# Patient Record
Sex: Male | Born: 1950
Health system: Southern US, Community
[De-identification: ages and names within clinical notes are randomized; demographics above are authoritative.]

## PROBLEM LIST (undated history)

## (undated) DIAGNOSIS — I671 Cerebral aneurysm, nonruptured: Secondary | ICD-10-CM

## (undated) DIAGNOSIS — C439 Malignant melanoma of skin, unspecified: Secondary | ICD-10-CM

## (undated) DIAGNOSIS — M1990 Unspecified osteoarthritis, unspecified site: Secondary | ICD-10-CM

## (undated) DIAGNOSIS — I639 Cerebral infarction, unspecified: Secondary | ICD-10-CM

## (undated) DIAGNOSIS — B019 Varicella without complication: Secondary | ICD-10-CM

## (undated) DIAGNOSIS — K219 Gastro-esophageal reflux disease without esophagitis: Secondary | ICD-10-CM

## (undated) HISTORY — DX: Varicella without complication: B01.9

## (undated) HISTORY — DX: Unspecified osteoarthritis, unspecified site: M19.90

## (undated) HISTORY — DX: Malignant melanoma of skin, unspecified: C43.9

## (undated) HISTORY — PX: TONSILLECTOMY AND ADENOIDECTOMY: SUR1326

## (undated) HISTORY — DX: Cerebral infarction, unspecified: I63.9

## (undated) HISTORY — DX: Cerebral aneurysm, nonruptured: I67.1

## (undated) HISTORY — DX: Gastro-esophageal reflux disease without esophagitis: K21.9

---

## 1989-01-29 HISTORY — PX: BRAIN SURGERY: SHX531

## 2013-11-19 ENCOUNTER — Encounter: Payer: Self-pay | Admitting: Family

## 2013-11-19 ENCOUNTER — Ambulatory Visit (INDEPENDENT_AMBULATORY_CARE_PROVIDER_SITE_OTHER): Payer: BC Managed Care – PPO | Admitting: Family

## 2013-11-19 VITALS — BP 138/88 | HR 65 | Temp 98.1°F | Resp 18 | Ht 72.0 in | Wt 203.4 lb

## 2013-11-19 DIAGNOSIS — N529 Male erectile dysfunction, unspecified: Secondary | ICD-10-CM | POA: Diagnosis not present

## 2013-11-19 DIAGNOSIS — Z125 Encounter for screening for malignant neoplasm of prostate: Secondary | ICD-10-CM | POA: Diagnosis not present

## 2013-11-19 DIAGNOSIS — K219 Gastro-esophageal reflux disease without esophagitis: Secondary | ICD-10-CM | POA: Diagnosis not present

## 2013-11-19 DIAGNOSIS — M79643 Pain in unspecified hand: Secondary | ICD-10-CM | POA: Diagnosis not present

## 2013-11-19 MED ORDER — SILDENAFIL CITRATE 25 MG PO TABS: 25.0000 mg | ORAL_TABLET | Freq: Every day | ORAL | Status: DC | PRN

## 2013-11-19 NOTE — Progress Notes (Signed)
   Subjective:    Patient ID: James Myers, male    DOB: December 30, 1950, 63 y.o.   MRN: [id number]  Chief Complaint  Patient presents with  . Establish Care    would like basic blood work    HPI:  James Myers is a 63 y.o. male who presents today to establish care. Been 2 years since any check up. Previously followed by [facility]. Followed by [facility] - Dr. Jarome Matin; Prefers not to be on many medications.   1) GERD - Started about 10 years ago; Has a history of hiatal hernia. Maintained without medication; if there is any increase in heartburn may take a TUMS every now and then.  2) Arthritis - Noted fingers to to be painful. Questions whether it be rheumatoid arthritis.   3) ED - been a problem for about 10 years. Unsure if testosterone levels were ever tested. Previously taking Viagra which he indicates was beneficial.   No Known Allergies  Current outpatient prescriptions:aspirin EC 81 MG tablet, Take 81 mg by mouth daily., Disp: , Rfl: ;  Glucosamine-Chondroitin (OSTEO BI-FLEX REGULAR STRENGTH PO), Take by mouth daily., Disp: , Rfl: ;  sildenafil (VIAGRA) 25 MG tablet, Take 1 tablet (25 mg total) by mouth daily as needed for erectile dysfunction., Disp: 4 tablet, Rfl: 0  Past Medical History  Diagnosis Date  . GERD (gastroesophageal reflux disease)   . Arthritis   . Stroke   . Brain aneurysm    Family History  Problem Relation Age of Onset  . Stroke Father   . Arthritis Father    History   Social History  . Marital Status: Married    Spouse Name: N/A    Number of Children: 1  . Years of Education: 16   Occupational History  . Retired      Geophysicist/field seismologist for [employer]  . Smoking status: Never Smoker   . Smokeless tobacco: Never Used  . Alcohol Use: Yes     Comment: 4 oz daily  . Drug Use: No  . Sexual Activity: Yes   Other Topics Concern  . Not on file   Social History Narrative   Born and raised in [place].  Currently resided in a private residence with his wife. Denies pets.    Fun: Golf and likes to swim / yard-work   Denies religious beliefs effecting healthcare.     Review of Systems    See HPI  Objective:    BP 138/88  Pulse 65  Temp(Src) 98.1 F (36.7 C) (Oral)  Resp 18  Ht 6' (1.829 m)  Wt 203 lb 6.4 oz (92.262 kg)  BMI 27.58 kg/m2  SpO2 97% Nursing note and vital signs reviewed.  Physical Exam  Constitutional: He is oriented to person, place, and time. He appears well-developed and well-nourished. No distress.  Cardiovascular: Normal rate, regular rhythm and normal heart sounds.   Pulmonary/Chest: Effort normal and breath sounds normal.  Musculoskeletal:  Grip strength appropriate bilaterally with tenderness of DIP and PIP joints of bilateral hands.   Neurological: He is alert and oriented to person, place, and time.  Skin: Skin is warm and dry.  Psychiatric: He has a normal mood and affect. His behavior is normal. Judgment and thought content normal.        Assessment & Plan:

## 2013-11-19 NOTE — Progress Notes (Signed)
Pre visit review using our clinic review tool, if applicable. No additional management support is needed unless otherwise documented below in the visit note. 

## 2013-11-19 NOTE — Assessment & Plan Note (Signed)
Most likely related to osteoarthritis. Currently taking Osteo Bi-Flex which helps. Discussed possibility of adding prescription medication if symptoms worsen. Limit NSAID with previous hx of hiatal hernia.

## 2013-11-19 NOTE — Assessment & Plan Note (Signed)
Previous history of ED and has taken Viagra. Obtain Testosterone level. Start Viagra as needed.

## 2013-11-19 NOTE — Patient Instructions (Addendum)
Thank you for choosing [facility].  Summary/Instructions:   Please schedule a time for a physical  Please stop by the lab to get your lab work at your convenience - please make sure to come in the morning for accurate testosterone levels.  Thank you for enrolling in [place]. Please follow the instructions below to securely access your online medical record. MyChart allows you to send messages to your doctor, view your test results, renew your prescriptions, schedule appointments, and more.  How Do I Sign Up? 1. In your [employer], go to [email or web address]. 2. Click on the New  User? link in the Sign In box.  3. Enter your MyChart Access Code exactly as it appears below. You will not need to use this code after you have completed the sign-up process. If you do not sign up before the expiration date, you must request a new code. MyChart Access Code: [email or web address] Expires: 01/18/2014  3:16 PM  4. Enter the last four digits of your Social Security Number (xxxx) and Date of Birth (mm/dd/yyyy) as indicated and click Next. You will be taken to the next sign-up page. 5. Create a MyChart ID. This will be your MyChart login ID and cannot be changed, so think of one that is secure and easy to remember. 6. Create a MyChart password. You can change your password at any time. 7. Enter your Password Reset Question and Answer and click Next. This can be used at a later time if you forget your password.  8. Select your communication preference, and if applicable enter your e-mail address. You will receive e-mail notification when new information is available in MyChart by choosing to receive e-mail notifications and filling in your e-mail. 9. Click Sign In. You can now view your medical record.   Additional Information If you have questions, you can email [email or web address] or call [phone] to talk to our [facility] staff. Remember, MyChart is NOT to be used for urgent  needs. For medical emergencies, dial 911.

## 2013-11-19 NOTE — Assessment & Plan Note (Signed)
Previous history of hiatal hernia. Currently stable on no prescription medication. Continue OTC Tums as needed. Will consider prescription medication if symptoms worsen.

## 2013-11-23 ENCOUNTER — Other Ambulatory Visit: Payer: Self-pay

## 2013-11-23 MED ORDER — SILDENAFIL CITRATE 20 MG PO TABS: 20.0000 mg | ORAL_TABLET | Freq: Every day | ORAL | Status: DC | PRN

## 2013-11-23 MED ORDER — SILDENAFIL CITRATE 25 MG PO TABS
25.0000 mg | ORAL_TABLET | Freq: Every day | ORAL | Status: DC | PRN
Start: 2013-11-23 — End: 2013-11-23

## 2013-11-25 ENCOUNTER — Telehealth: Payer: Self-pay | Admitting: Family

## 2013-11-25 ENCOUNTER — Other Ambulatory Visit (INDEPENDENT_AMBULATORY_CARE_PROVIDER_SITE_OTHER): Payer: BC Managed Care – PPO

## 2013-11-25 DIAGNOSIS — Z125 Encounter for screening for malignant neoplasm of prostate: Secondary | ICD-10-CM

## 2013-11-25 DIAGNOSIS — N529 Male erectile dysfunction, unspecified: Secondary | ICD-10-CM

## 2013-11-25 DIAGNOSIS — K219 Gastro-esophageal reflux disease without esophagitis: Secondary | ICD-10-CM

## 2013-11-25 LAB — LIPID PANEL
CHOLESTEROL: 184 mg/dL (ref 0–200)
HDL: 55 mg/dL (ref >39.00)
LDL Cholesterol: 120 mg/dL — ABNORMAL HIGH (ref 0–99)
NONHDL: 129
Total CHOL/HDL Ratio: 3
Triglycerides: 45 mg/dL (ref 0.0–149.0)
VLDL: 9 mg/dL (ref 0.0–40.0)

## 2013-11-25 LAB — BASIC METABOLIC PANEL
BUN: 21 mg/dL (ref 6–23)
CO2: 23 mEq/L (ref 19–32)
Calcium: 9.1 mg/dL (ref 8.4–10.5)
Chloride: 104 mEq/L (ref 96–112)
Creatinine, Ser: 1.2 mg/dL (ref 0.4–1.5)
GFR: 68.12 mL/min (ref >60.00)
Glucose, Bld: 101 mg/dL — ABNORMAL HIGH (ref 70–99)
POTASSIUM: 4.3 meq/L (ref 3.5–5.1)
SODIUM: 139 meq/L (ref 135–145)

## 2013-11-25 LAB — CBC
HEMATOCRIT: 42.7 % (ref 39.0–52.0)
HEMOGLOBIN: 13.5 g/dL (ref 13.0–17.0)
MCHC: 31.7 g/dL (ref 30.0–36.0)
MCV: 83.8 fl (ref 78.0–100.0)
Platelets: 309 10*3/uL (ref 150.0–400.0)
RBC: 5.09 Mil/uL (ref 4.22–5.81)
RDW: 18.2 % — ABNORMAL HIGH (ref 11.5–15.5)
WBC: 5.1 10*3/uL (ref 4.0–10.5)

## 2013-11-25 LAB — PSA: PSA: 0.33 ng/mL (ref 0.10–4.00)

## 2013-11-25 LAB — TESTOSTERONE: Testosterone: 402.82 ng/dL (ref 300.00–890.00)

## 2013-11-25 NOTE — Telephone Encounter (Signed)
Please call the patient to let him know that his prostate blood test was normal. His testosterone was also in the normal range. There was one

## 2013-11-25 NOTE — Telephone Encounter (Signed)
Please call the patient and let him know that both his testosterone levels and prostate levels were within the normal range. There was one lab that I would like to gather more information. His red blood cell size are slightly smaller than the average and when he comes in for the next appointment I'd like to order another lab to test for iron deficiency. This is nothing to be alarmed about, just trying to catch things to ensure we do not have a problems later.

## 2013-11-26 NOTE — Telephone Encounter (Signed)
Called pt and no answer. Left message

## 2013-11-26 NOTE — Telephone Encounter (Signed)
Pt called back. Let him know his testosterone and prostate levels were within normal limits but his his rbcs were slightly smaller than average. Let him know that [facility] would like to do more lab work to test for iron deficiency. He understood and wasn't surprise because he donates blood regularly. He asked that his lab results be sent to my chart. Will you please send them?

## 2013-11-26 NOTE — Telephone Encounter (Signed)
Results released

## 2013-12-01 ENCOUNTER — Encounter: Payer: Self-pay | Admitting: Family

## 2013-12-01 ENCOUNTER — Ambulatory Visit (INDEPENDENT_AMBULATORY_CARE_PROVIDER_SITE_OTHER): Payer: BC Managed Care – PPO | Admitting: Family

## 2013-12-01 ENCOUNTER — Other Ambulatory Visit (INDEPENDENT_AMBULATORY_CARE_PROVIDER_SITE_OTHER): Payer: BC Managed Care – PPO

## 2013-12-01 ENCOUNTER — Other Ambulatory Visit: Payer: Self-pay | Admitting: Family

## 2013-12-01 VITALS — BP 130/88 | HR 55 | Temp 97.9°F | Resp 18 | Ht 72.0 in | Wt 203.1 lb

## 2013-12-01 DIAGNOSIS — Z23 Encounter for immunization: Secondary | ICD-10-CM | POA: Diagnosis not present

## 2013-12-01 DIAGNOSIS — R718 Other abnormality of red blood cells: Secondary | ICD-10-CM

## 2013-12-01 DIAGNOSIS — Z0000 Encounter for general adult medical examination without abnormal findings: Secondary | ICD-10-CM

## 2013-12-01 LAB — CBC
HCT: 41.9 % (ref 39.0–52.0)
Hemoglobin: 13.5 g/dL (ref 13.0–17.0)
MCHC: 32.2 g/dL (ref 30.0–36.0)
MCV: 82.9 fl (ref 78.0–100.0)
PLATELETS: 278 10*3/uL (ref 150.0–400.0)
RBC: 5.05 Mil/uL (ref 4.22–5.81)
RDW: 17.8 % — ABNORMAL HIGH (ref 11.5–15.5)
WBC: 4.7 10*3/uL (ref 4.0–10.5)

## 2013-12-01 LAB — IRON AND TIBC
%SAT: 6 % — ABNORMAL LOW (ref 20–55)
IRON: 27 ug/dL — AB (ref 42–165)
TIBC: 463 ug/dL — AB (ref 215–435)
UIBC: 436 ug/dL — AB (ref 125–400)

## 2013-12-01 LAB — TSH: TSH: 2.59 u[IU]/mL (ref 0.35–4.50)

## 2013-12-01 LAB — FERRITIN: FERRITIN: 5.6 ng/mL — AB (ref 22.0–322.0)

## 2013-12-01 MED ORDER — FERROUS SULFATE 325 (65 FE) MG PO TABS: 325.0000 mg | ORAL_TABLET | Freq: Every day | ORAL | Status: DC

## 2013-12-01 NOTE — Progress Notes (Signed)
Subjective:    Patient ID: James Myers, male    DOB: Mar 20, 1950, 63 y.o.   MRN: [id number]  Chief Complaint  Patient presents with  . CPE   HPI:  James Myers is a 63 y.o. male who presents today for an annual wellness visit.  1) Health Maintenance -   Diet - Thankful for wife because she helps to monitor his food and makes sure he has a balanced meals of fruits, vegetables and protein.  Exercise - Member of [employer] [facility] [place] - plays golf, swims and exercise.   Wt Readings from Last 3 Encounters:  12/01/13 203 lb 1.9 oz (92.135 kg)  11/19/13 203 lb 6.4 oz (92.262 kg)   2) Preventative Exams / Immunizations:  Dental - Up to date  Vision -- Up to date  Immunizations --Had flu vaccine; Unsure of PPSV23; Zostovax is completed.  Colonoscopy -- Up date - recommends 2017 follow up.   Review of Systems  Constitutional: Denies fever, chills, fatigue, or significant weight gain/loss. HENT: Head: Denies headache or neck pain Ears: Denies changes in hearing, ringing in ears, earache, drainage Nose: Denies discharge, stuffiness, itching, nosebleed, sinus pain Throat: Denies sore throat, hoarseness, dry mouth, sores, thrush Eyes: Denies loss/changes in vision, pain, redness, blurry/double vision, flashing lights Cardiovascular: Denies chest pain/discomfort, tightness, palpitations, shortness of breath with activity, difficulty lying down, swelling, sudden awakening with shortness of breath Respiratory: Denies shortness of breath, cough, sputum production, wheezing Gastrointestinal: Denies dysphasia, heartburn, change in appetite, nausea, change in bowel habits, rectal bleeding, constipation, diarrhea, yellow skin or eyes Genitourinary: Denies frequency, urgency, burning/pain, blood in urine, incontinence, change in urinary strength. Musculoskeletal: Denies muscle/joint pain, stiffness, back pain, redness or swelling of joints, trauma Skin: Denies rashes, lumps, itching, dryness,  color changes, or hair/nail changes Neurological: Denies dizziness, fainting, seizures, weakness, numbness, tingling, tremor Psychiatric - Denies nervousness, stress, depression or memory loss Endocrine: Denies heat or cold intolerance, sweating, frequent urination, excessive thirst, changes in appetite Hematologic: Denies ease of bruising or bleeding    Objective:    BP 130/88 mmHg  Pulse 55  Temp(Src) 97.9 F (36.6 C) (Oral)  Resp 18  Ht 6' (1.829 m)  Wt 203 lb 1.9 oz (92.135 kg)  BMI 27.54 kg/m2  SpO2 97% Nursing note and vital signs reviewed.  Physical Exam  Constitutional: He is oriented to person, place, and time. He appears well-developed and well-nourished.  HENT:  Head: Normocephalic.  Right Ear: Hearing, tympanic membrane, external ear and ear canal normal.  Left Ear: Hearing, tympanic membrane, external ear and ear canal normal.  Nose: Nose normal.  Mouth/Throat: Uvula is midline, oropharynx is clear and moist and mucous membranes are normal.  Eyes: Conjunctivae and EOM are normal. Pupils are equal, round, and reactive to light.  Neck: Neck supple. No JVD present. No tracheal deviation present. No thyromegaly present.  Cardiovascular: Normal rate, regular rhythm, normal heart sounds and intact distal pulses.   Pulmonary/Chest: Effort normal and breath sounds normal.  Abdominal: Soft. Bowel sounds are normal. He exhibits no distension and no mass. There is no tenderness. There is no rebound and no guarding.  Musculoskeletal: Normal range of motion. He exhibits no edema or tenderness.  Lymphadenopathy:    He has no cervical adenopathy.  Neurological: He is alert and oriented to person, place, and time. He has normal reflexes. No cranial nerve deficit. He exhibits normal muscle tone. Coordination normal.  Skin: Skin is warm and dry.  Psychiatric: He has a  normal mood and affect. His behavior is normal. Judgment and thought content normal.      Assessment & Plan:

## 2013-12-01 NOTE — Assessment & Plan Note (Addendum)
1) Anticipatory Guidance: Discussed importance of wearing a seatbelt while driving and not texting while driving; changing batteries in smoke detector at least once annually; wearing suntan lotion when outside; eating a balanced and moderate diet; getting physical activity at least 30 minutes per day.  2) Immunizations / Screenings / Labs:  Given PPSV23 in office today; all other immunizations are up to date. / Screening and prevention tests are up to date / Obtain CBC, ferritin, TIBC and TSH.  Overall normal preventative exam. Follow up in 1 year or sooner if needed.

## 2013-12-01 NOTE — Progress Notes (Signed)
Pre visit review using our clinic review tool, if applicable. No additional management support is needed unless otherwise documented below in the visit note. 

## 2013-12-01 NOTE — Patient Instructions (Addendum)
Thank you for choosing [facility].  Summary/Instructions:   Please stop by the for your blood work.  We will be in touch regarding your lab work  Plan to follow up in a year or sooner if needed.

## 2014-01-05 ENCOUNTER — Telehealth: Payer: Self-pay | Admitting: Family

## 2014-01-05 NOTE — Telephone Encounter (Signed)
Rec'd from [place] @ [facility] [employer] forward 16 pages to [facility]

## 2014-07-06 ENCOUNTER — Ambulatory Visit (INDEPENDENT_AMBULATORY_CARE_PROVIDER_SITE_OTHER): Payer: BLUE CROSS/BLUE SHIELD | Admitting: Family

## 2014-07-06 ENCOUNTER — Encounter: Payer: Self-pay | Admitting: Family

## 2014-07-06 ENCOUNTER — Ambulatory Visit (INDEPENDENT_AMBULATORY_CARE_PROVIDER_SITE_OTHER)
Admission: RE | Admit: 2014-07-06 | Discharge: 2014-07-06 | Disposition: A | Discharge status: Home / Self Care | Payer: BLUE CROSS/BLUE SHIELD | Source: Ambulatory Visit | Attending: Family | Admitting: Family

## 2014-07-06 VITALS — BP 112/70 | HR 79 | Temp 98.1°F | Resp 18 | Ht 72.0 in | Wt 206.4 lb

## 2014-07-06 DIAGNOSIS — M25511 Pain in right shoulder: Secondary | ICD-10-CM | POA: Insufficient documentation

## 2014-07-06 MED ORDER — NAPROXEN 500 MG PO TABS: 500.0000 mg | ORAL_TABLET | Freq: Two times a day (BID) | ORAL | Status: DC

## 2014-07-06 NOTE — Progress Notes (Signed)
Pre visit review using our clinic review tool, if applicable. No additional management support is needed unless otherwise documented below in the visit note. 

## 2014-07-06 NOTE — Assessment & Plan Note (Signed)
Symptoms and exam consistent with possible shoulder contusion, however cannot rule out rotator cuff pathology or fracture. Obtain x-ray to rule fracture. She conservatively at this time with rest and ice. Recommend short-term sling for support shoulder. Start naproxen 500 mg twice a day. Follow-up in one week or sooner for secondary assessment and possible referral.

## 2014-07-06 NOTE — Progress Notes (Signed)
Subjective:    Patient ID: James Myers, male    DOB: [birth year], 64 y.o.   MRN: [id number]  Chief Complaint  Patient presents with  . Shoulder Pain    Right shoulder pain x2 hours ago. Was playing basketball and landed on shoulder, already has arthritis in that shoulder    HPI:  James Myers is a 64 y.o. male with a PMH of GERD and hand pain who presents today for an acute office visit.  This is a new problem. Associated symptoms of pain located in his right shoulder started approximately 2 hours ago following a fall while playing basketball. Describes the pain as sharp pains with a severity of 9/10 when he tries to lift. Denies any sounds or sensations heard or felt upon landing. Notes that he can passively move his shoulder with minimal pain but has difficulty with active range of motion. Modifying factors include ice and Aleve which have helped a little. Does have arthritis in his right shoulder.   No Known Allergies  Current Outpatient Prescriptions on File Prior to Visit  Medication Sig Dispense Refill  . aspirin EC 81 MG tablet Take 81 mg by mouth daily.    . ferrous sulfate 325 (65 FE) MG tablet Take 1 tablet (325 mg total) by mouth daily with breakfast. 90 tablet 3  . Glucosamine-Chondroitin (OSTEO BI-FLEX REGULAR STRENGTH PO) Take by mouth daily.    . sildenafil (REVATIO) 20 MG tablet Take 1 tablet (20 mg total) by mouth daily as needed (for erectile dysfunction). 10 tablet 0   No current facility-administered medications on file prior to visit.    Past Medical History  Diagnosis Date  . GERD (gastroesophageal reflux disease)   . Arthritis   . Stroke   . Brain aneurysm     Review of Systems  Musculoskeletal: Positive for arthralgias.  Neurological: Negative for numbness.      Objective:    BP 112/70 mmHg  Pulse 79  Temp(Src) 98.1 F (36.7 C) (Oral)  Resp 18  Ht 6' (1.829 m)  Wt 206 lb 6.4 oz (93.622 kg)  BMI 27.99 kg/m2  SpO2 98% Nursing note and vital  signs reviewed.  Physical Exam  Constitutional: He is oriented to person, place, and time. He appears well-developed and well-nourished. No distress.  Cardiovascular: Normal rate, regular rhythm, normal heart sounds and intact distal pulses.   Pulmonary/Chest: Effort normal and breath sounds normal.  Musculoskeletal:  Right shoulder: No obvious deformity or discoloration noted. Mild edema compared to contralateral side. Palpable tenderness along the mid deltoid and supraspinatus insertion. Range of motion is severely restricted on the right with inability to flex or abduct the shoulder actively, however can perform these motions with active assistance or passively. Special tests are limited secondary to pain.  Neurological: He is alert and oriented to person, place, and time.  Skin: Skin is warm and dry.  Psychiatric: He has a normal mood and affect. His behavior is normal. Judgment and thought content normal.       Assessment & Plan:   Problem List Items Addressed This Visit      Other   Right shoulder pain - Primary    Symptoms and exam consistent with possible shoulder contusion, however cannot rule out rotator cuff pathology or fracture. Obtain x-ray to rule fracture. She conservatively at this time with rest and ice. Recommend short-term sling for support shoulder. Start naproxen 500 mg twice a day. Follow-up in one week or sooner for secondary assessment  and possible referral.      Relevant Medications   naproxen (NAPROSYN) 500 MG tablet   Other Relevant Orders   DG Shoulder Right (Completed)

## 2014-07-06 NOTE — Patient Instructions (Addendum)
Thank you for choosing [facility].  Summary/Instructions:  Consider a sling as needed to take pressure off your right shoulder.   Your prescription(s) have been submitted to your pharmacy or been printed and provided for you. Please take as directed and contact our office if you believe you are having problem(s) with the medication(s) or have any questions.  Please stop by radiology on the basement level of the building for your x-rays. Your results will be released to [place] (or called to you) after review, usually within 72 hours after test completion. If any treatments or changes are necessary, you will be notified at that same time.  If your symptoms worsen or fail to improve, please contact our office for further instruction, or in case of emergency go directly to the emergency room at the closest medical facility.    RICE: Routine Care for Injuries The routine care of many injuries includes Rest, Ice, Compression, and Elevation (RICE). HOME CARE INSTRUCTIONS  Rest is needed to allow your body to heal. Routine activities can usually be resumed when comfortable. Injured tendons and bones can take up to 6 weeks to heal. Tendons are the cord-like structures that attach muscle to bone.  Ice following an injury helps keep the swelling down and reduces pain.  Put ice in a plastic bag.  Place a towel between your skin and the bag.  Leave the ice on for 15-20 minutes, 3-4 times a day, or as directed by your health care provider. Do this while awake, for the first 24 to 48 hours. After that, continue as directed by your caregiver.  Compression helps keep swelling down. It also gives support and helps with discomfort. If an elastic bandage has been applied, it should be removed and reapplied every 3 to 4 hours. It should not be applied tightly, but firmly enough to keep swelling down. Watch fingers or toes for swelling, bluish discoloration, coldness, numbness, or excessive pain. If  any of these problems occur, remove the bandage and reapply loosely. Contact your caregiver if these problems continue.  Elevation helps reduce swelling and decreases pain. With extremities, such as the arms, hands, legs, and feet, the injured area should be placed near or above the level of the heart, if possible. SEEK IMMEDIATE MEDICAL CARE IF:  You have persistent pain and swelling.  You develop redness, numbness, or unexpected weakness.  Your symptoms are getting worse rather than improving after several days. These symptoms may indicate that further evaluation or further X-rays are needed. Sometimes, X-rays may not show a small broken bone (fracture) until 1 week or 10 days later. Make a follow-up appointment with your caregiver. Ask when your X-ray results will be ready. Make sure you get your X-ray results. Document Released: 04/29/2000 Document Revised: 01/20/2013 Document Reviewed: 06/16/2010 [facility] Patient Information 2015 [place], [place]. This information is not intended to replace advice given to you by your health care provider. Make sure you discuss any questions you have with your health care provider.

## 2014-07-07 ENCOUNTER — Encounter: Payer: Self-pay | Admitting: Family

## 2014-07-13 ENCOUNTER — Encounter: Payer: Self-pay | Admitting: Family

## 2015-07-01 ENCOUNTER — Ambulatory Visit: Payer: BLUE CROSS/BLUE SHIELD | Admitting: Family

## 2015-08-23 DIAGNOSIS — L570 Actinic keratosis: Secondary | ICD-10-CM | POA: Diagnosis not present

## 2015-08-23 DIAGNOSIS — Z85828 Personal history of other malignant neoplasm of skin: Secondary | ICD-10-CM | POA: Diagnosis not present

## 2015-08-23 DIAGNOSIS — M7130 Other bursal cyst, unspecified site: Secondary | ICD-10-CM | POA: Diagnosis not present

## 2015-08-23 DIAGNOSIS — L821 Other seborrheic keratosis: Secondary | ICD-10-CM | POA: Diagnosis not present

## 2015-08-23 DIAGNOSIS — L812 Freckles: Secondary | ICD-10-CM | POA: Diagnosis not present

## 2015-12-09 ENCOUNTER — Ambulatory Visit (INDEPENDENT_AMBULATORY_CARE_PROVIDER_SITE_OTHER): Payer: Medicare Other | Admitting: Family

## 2015-12-09 ENCOUNTER — Encounter: Payer: Self-pay | Admitting: Family

## 2015-12-09 VITALS — BP 138/84 | HR 47 | Temp 98.4°F | Resp 16 | Ht 72.0 in | Wt 206.0 lb

## 2015-12-09 DIAGNOSIS — M7582 Other shoulder lesions, left shoulder: Secondary | ICD-10-CM | POA: Diagnosis not present

## 2015-12-09 DIAGNOSIS — Z23 Encounter for immunization: Secondary | ICD-10-CM | POA: Diagnosis not present

## 2015-12-09 DIAGNOSIS — M778 Other enthesopathies, not elsewhere classified: Secondary | ICD-10-CM | POA: Insufficient documentation

## 2015-12-09 NOTE — Progress Notes (Signed)
Subjective:    Patient ID: James Myers, male    DOB: 1950/10/23, 65 y.o.   MRN: [place]:[id number]  Chief Complaint  Patient presents with  . Joint Pain    having more frequent joint pain, having pain in left shoulder, arthritis questions    HPI:  James Myers is a 65 y.o. male who  has a past medical history of Arthritis; Brain aneurysm; GERD (gastroesophageal reflux disease); and Stroke ([place]). and presents today for a follow up office visit.    This is a new problem. Associated symptom of pain located in his left shoulder has been going on for about several months. Modifying factors include a TENS unit and OTC medication which have helped some. Has also tried glucosamine.  Range of motion is intact. No trauma or injury that he can recall. Pain described as sharp on occasion. No neck pain or numbness or tingling. Sleeping on it can cause discomfort.    No Known Allergies    Outpatient Medications Prior to Visit  Medication Sig Dispense Refill  . aspirin EC 81 MG tablet Take 81 mg by mouth daily.    . ferrous sulfate 325 (65 FE) MG tablet Take 1 tablet (325 mg total) by mouth daily with breakfast. 90 tablet 3  . Glucosamine-Chondroitin (OSTEO BI-FLEX REGULAR STRENGTH PO) Take by mouth 2 (two) times daily.     . naproxen (NAPROSYN) 500 MG tablet Take 1 tablet (500 mg total) by mouth 2 (two) times daily with a meal. 60 tablet 0  . sildenafil (REVATIO) 20 MG tablet Take 1 tablet (20 mg total) by mouth daily as needed (for erectile dysfunction). 10 tablet 0   No facility-administered medications prior to visit.       Past Surgical History:  Procedure Laterality Date  . TONSILLECTOMY AND ADENOIDECTOMY        Past Medical History:  Diagnosis Date  . Arthritis   . Brain aneurysm   . GERD (gastroesophageal reflux disease)   . Stroke [facility])       Review of Systems  Constitutional: Negative for chills and fever.  Musculoskeletal:       Positive for left shoulder pain.     Neurological: Negative for weakness and numbness.      Objective:    BP 138/84 (BP Location: Right Arm, Patient Position: Sitting, Cuff Size: Normal)   Pulse (!) 47   Temp 98.4 F (36.9 C) (Oral)   Resp 16   Ht 6' (1.829 m)   Wt 206 lb (93.4 kg)   SpO2 96%   BMI 27.94 kg/m  Nursing note and vital signs reviewed.  Physical Exam  Constitutional: He is oriented to person, place, and time. He appears well-developed and well-nourished. No distress.  Cardiovascular: Normal rate, regular rhythm, normal heart sounds and intact distal pulses.   Pulmonary/Chest: Effort normal and breath sounds normal.  Musculoskeletal:  Left shoulder - no obvious deformity, discoloration, or edema. Mild tenderness located over the subacromial space and posterior aspect of the glenohumeral joint with no crepitus or deformity. No neck pain palpable. Range of motion is within normal limits bilaterally. Strength is 4+ with some discomfort noted in empty can. Distal pulses and sensation are intact and appropriate. Negative Michel Bickers; positive Neer's impingement; positive empty can.  Neurological: He is alert and oriented to person, place, and time.  Skin: Skin is warm and dry.  Psychiatric: He has a normal mood and affect. His behavior is normal. Judgment and thought content normal.  Assessment & Plan:   Problem List Items Addressed This Visit      Musculoskeletal and Integument   Left shoulder tendinitis    Symptoms and exam consistent with left shoulder tendinitis or possible subacromial impingement. Start ice and home exercise therapy and continue current dosage of naproxen. Refer to physical therapy. Declines cortisone injection. If symptoms worsen or do not improve consider additional imaging and/or cortisone injection.      Relevant Orders   Ambulatory referral to Physical Therapy    Other Visit Diagnoses    Encounter for immunization       Relevant Orders   Flu vaccine HIGH DOSE PF  (Completed)       I am having James Myers maintain his aspirin EC, Glucosamine-Chondroitin (OSTEO BI-FLEX REGULAR STRENGTH PO), sildenafil, ferrous sulfate, and naproxen.   Follow-up: Return in about 1 month (around 01/08/2016).  Mauricio Po, FNP

## 2015-12-09 NOTE — Patient Instructions (Signed)
Thank you for choosing [facility].  SUMMARY AND INSTRUCTIONS:  Ice x 20 minutes every 2 hours and after activity and before bed.   Stretches and exercises daily.   They will call to schedule your physical therapy.  Vimovo - 2x per day until completed and then 2 tablets of OTC naproxen 2x daily as needed.   Medication:  Your prescription(s) have been submitted to your pharmacy or been printed and provided for you. Please take as directed and contact our office if you believe you are having problem(s) with the medication(s) or have any questions.    Follow up:  If your symptoms worsen or fail to improve, please contact our office for further instruction, or in case of emergency go directly to the emergency room at the closest medical facility.    Impingement Syndrome, Rotator Cuff, Bursitis With Rehab Impingement syndrome is a condition that involves inflammation of the tendons of the rotator cuff and the subacromial bursa, that causes pain in the shoulder. The rotator cuff consists of four tendons and muscles that control much of the shoulder and upper arm function. The subacromial bursa is a fluid filled sac that helps reduce friction between the rotator cuff and one of the bones of the shoulder (acromion). Impingement syndrome is usually an overuse injury that causes swelling of the bursa (bursitis), swelling of the tendon (tendonitis), and/or a tear of the tendon (strain). Strains are classified into three categories. Grade 1 strains cause pain, but the tendon is not lengthened. Grade 2 strains include a lengthened ligament, due to the ligament being stretched or partially ruptured. With grade 2 strains there is still function, although the function may be decreased. Grade 3 strains include a complete tear of the tendon or muscle, and function is usually impaired. SYMPTOMS   Pain around the shoulder, often at the outer portion of the upper arm.  Pain that gets worse with  shoulder function, especially when reaching overhead or lifting.  Sometimes, aching when not using the arm.  Pain that wakes you up at night.  Sometimes, tenderness, swelling, warmth, or redness over the affected area.  Loss of strength.  Limited motion of the shoulder, especially reaching behind the back (to the back pocket or to unhook bra) or across your body.  Crackling sound (crepitation) when moving the arm.  Biceps tendon pain and inflammation (in the front of the shoulder). Worse when bending the elbow or lifting. CAUSES  Impingement syndrome is often an overuse injury, in which chronic (repetitive) motions cause the tendons or bursa to become inflamed. A strain occurs when a force is paced on the tendon or muscle that is greater than it can withstand. Common mechanisms of injury include: Stress from sudden increase in duration, frequency, or intensity of training.  Direct hit (trauma) to the shoulder.  Aging, erosion of the tendon with normal use.  Bony bump on shoulder (acromial spur). RISK INCREASES WITH:  Contact sports (football, wrestling, boxing).  Throwing sports (baseball, tennis, volleyball).  Weightlifting and bodybuilding.  Heavy labor.  Previous injury to the rotator cuff, including impingement.  Poor shoulder strength and flexibility.  Failure to warm up properly before activity.  Inadequate protective equipment.  Old age.  Bony bump on shoulder (acromial spur). PREVENTION   Warm up and stretch properly before activity.  Allow for adequate recovery between workouts.  Maintain physical fitness:  Strength, flexibility, and endurance.  Cardiovascular fitness.  Learn and use proper exercise technique. PROGNOSIS  If treated properly, impingement  syndrome usually goes away within 6 weeks. Sometimes surgery is required.  RELATED COMPLICATIONS   Longer healing time if not properly treated, or if not given enough time to heal.  Recurring  symptoms, that result in a chronic condition.  Shoulder stiffness, frozen shoulder, or loss of motion.  Rotator cuff tendon tear.  Recurring symptoms, especially if activity is resumed too soon, with overuse, with a direct blow, or when using poor technique. TREATMENT  Treatment first involves the use of ice and medicine, to reduce pain and inflammation. The use of strengthening and stretching exercises may help reduce pain with activity. These exercises may be performed at home or with a therapist. If non-surgical treatment is unsuccessful after more than 6 months, surgery may be advised. After surgery and rehabilitation, activity is usually possible in 3 months.  MEDICATION  If pain medicine is needed, nonsteroidal anti-inflammatory medicines (aspirin and ibuprofen), or other minor pain relievers (acetaminophen), are often advised.  Do not take pain medicine for 7 days before surgery.  Prescription pain relievers may be given, if your caregiver thinks they are needed. Use only as directed and only as much as you need.  Corticosteroid injections may be given by your caregiver. These injections should be reserved for the most serious cases, because they may only be given a certain number of times. HEAT AND COLD  Cold treatment (icing) should be applied for 10 to 15 minutes every 2 to 3 hours for inflammation and pain, and immediately after activity that aggravates your symptoms. Use ice packs or an ice massage.  Heat treatment may be used before performing stretching and strengthening activities prescribed by your caregiver, physical therapist, or athletic trainer. Use a heat pack or a warm water soak. SEEK MEDICAL CARE IF:   Symptoms get worse or do not improve in 4 to 6 weeks, despite treatment.  New, unexplained symptoms develop. (Drugs used in treatment may produce side effects.) EXERCISES  RANGE OF MOTION (ROM) AND STRETCHING EXERCISES - Impingement Syndrome (Rotator Cuff    Tendinitis, Bursitis) These exercises may help you when beginning to rehabilitate your injury. Your symptoms may go away with or without further involvement from your physician, physical therapist or athletic trainer. While completing these exercises, remember:   Restoring tissue flexibility helps normal motion to return to the joints. This allows healthier, less painful movement and activity.  An effective stretch should be held for at least 30 seconds.  A stretch should never be painful. You should only feel a gentle lengthening or release in the stretched tissue. STRETCH - Flexion, Standing  Stand with good posture. With an underhand grip on your right / left hand, and an overhand grip on the opposite hand, grasp a broomstick or cane so that your hands are a little more than shoulder width apart.  Keeping your right / left elbow straight and shoulder muscles relaxed, push the stick with your opposite hand, to raise your right / left arm in front of your body and then overhead. Raise your arm until you feel a stretch in your right / left shoulder, but before you have increased shoulder pain.  Try to avoid shrugging your right / left shoulder as your arm rises, by keeping your shoulder blade tucked down and toward your mid-back spine. Hold for __________ seconds.  Slowly return to the starting position. Repeat __________ times. Complete this exercise __________ times per day. STRETCH - Abduction, Supine  Lie on your back. With an underhand grip on your right /  left hand and an overhand grip on the opposite hand, grasp a broomstick or cane so that your hands are a little more than shoulder width apart.  Keeping your right / left elbow straight and your shoulder muscles relaxed, push the stick with your opposite hand, to raise your right / left arm out to the side of your body and then overhead. Raise your arm until you feel a stretch in your right / left shoulder, but before you have  increased shoulder pain.  Try to avoid shrugging your right / left shoulder as your arm rises, by keeping your shoulder blade tucked down and toward your mid-back spine. Hold for __________ seconds.  Slowly return to the starting position. Repeat __________ times. Complete this exercise __________ times per day. ROM - Flexion, Active-Assisted  Lie on your back. You may bend your knees for comfort.  Grasp a broomstick or cane so your hands are about shoulder width apart. Your right / left hand should grip the end of the stick, so that your hand is positioned "thumbs-up," as if you were about to shake hands.  Using your healthy arm to lead, raise your right / left arm overhead, until you feel a gentle stretch in your shoulder. Hold for __________ seconds.  Use the stick to assist in returning your right / left arm to its starting position. Repeat __________ times. Complete this exercise __________ times per day.  ROM - Internal Rotation, Supine   Lie on your back on a firm surface. Place your right / left elbow about 60 degrees away from your side. Elevate your elbow with a folded towel, so that the elbow and shoulder are the same height.  Using a broomstick or cane and your strong arm, pull your right / left hand toward your body until you feel a gentle stretch, but no increase in your shoulder pain. Keep your shoulder and elbow in place throughout the exercise.  Hold for __________ seconds. Slowly return to the starting position. Repeat __________ times. Complete this exercise __________ times per day. STRETCH - Internal Rotation  Place your right / left hand behind your back, palm up.  Throw a towel or belt over your opposite shoulder. Grasp the towel with your right / left hand.  While keeping an upright posture, gently pull up on the towel, until you feel a stretch in the front of your right / left shoulder.  Avoid shrugging your right / left shoulder as your arm rises, by keeping  your shoulder blade tucked down and toward your mid-back spine.  Hold for __________ seconds. Release the stretch, by lowering your healthy hand. Repeat __________ times. Complete this exercise __________ times per day. ROM - Internal Rotation   Using an underhand grip, grasp a stick behind your back with both hands.  While standing upright with good posture, slide the stick up your back until you feel a mild stretch in the front of your shoulder.  Hold for __________ seconds. Slowly return to your starting position. Repeat __________ times. Complete this exercise __________ times per day.  STRETCH - Posterior Shoulder Capsule   Stand or sit with good posture. Grasp your right / left elbow and draw it across your chest, keeping it at the same height as your shoulder.  Pull your elbow, so your upper arm comes in closer to your chest. Pull until you feel a gentle stretch in the back of your shoulder.  Hold for __________ seconds. Repeat __________ times. Complete this exercise  __________ times per day. STRENGTHENING EXERCISES - Impingement Syndrome (Rotator Cuff Tendinitis, Bursitis) These exercises may help you when beginning to rehabilitate your injury. They may resolve your symptoms with or without further involvement from your physician, physical therapist or athletic trainer. While completing these exercises, remember:  Muscles can gain both the endurance and the strength needed for everyday activities through controlled exercises.  Complete these exercises as instructed by your physician, physical therapist or athletic trainer. Increase the resistance and repetitions only as guided.  You may experience muscle soreness or fatigue, but the pain or discomfort you are trying to eliminate should never worsen during these exercises. If this pain does get worse, stop and make sure you are following the directions exactly. If the pain is still present after adjustments, discontinue the  exercise until you can discuss the trouble with your clinician.  During your recovery, avoid activity or exercises which involve actions that place your injured hand or elbow above your head or behind your back or head. These positions stress the tissues which you are trying to heal. STRENGTH - Scapular Depression and Adduction   With good posture, sit on a firm chair. Support your arms in front of you, with pillows, arm rests, or on a table top. Have your elbows in line with the sides of your body.  Gently draw your shoulder blades down and toward your mid-back spine. Gradually increase the tension, without tensing the muscles along the top of your shoulders and the back of your neck.  Hold for __________ seconds. Slowly release the tension and relax your muscles completely before starting the next repetition.  After you have practiced this exercise, remove the arm support and complete the exercise in standing as well as sitting position. Repeat __________ times. Complete this exercise __________ times per day.  STRENGTH - Shoulder Abductors, Isometric  With good posture, stand or sit about 4-6 inches from a wall, with your right / left side facing the wall.  Bend your right / left elbow. Gently press your right / left elbow into the wall. Increase the pressure gradually, until you are pressing as hard as you can, without shrugging your shoulder or increasing any shoulder discomfort.  Hold for __________ seconds.  Release the tension slowly. Relax your shoulder muscles completely before you begin the next repetition. Repeat __________ times. Complete this exercise __________ times per day.  STRENGTH - External Rotators, Isometric  Keep your right / left elbow at your side and bend it 90 degrees.  Step into a door frame so that the outside of your right / left wrist can press against the door frame without your upper arm leaving your side.  Gently press your right / left wrist into the  door frame, as if you were trying to swing the back of your hand away from your stomach. Gradually increase the tension, until you are pressing as hard as you can, without shrugging your shoulder or increasing any shoulder discomfort.  Hold for __________ seconds.  Release the tension slowly. Relax your shoulder muscles completely before you begin the next repetition. Repeat __________ times. Complete this exercise __________ times per day.  STRENGTH - Supraspinatus   Stand or sit with good posture. Grasp a __________ weight, or an exercise band or tubing, so that your hand is "thumbs-up," like you are shaking hands.  Slowly lift your right / left arm in a "V" away from your thigh, diagonally into the space between your side and straight ahead. Lift  your hand to shoulder height or as far as you can, without increasing any shoulder pain. At first, many people do not lift their hands above shoulder height.  Avoid shrugging your right / left shoulder as your arm rises, by keeping your shoulder blade tucked down and toward your mid-back spine.  Hold for __________ seconds. Control the descent of your hand, as you slowly return to your starting position. Repeat __________ times. Complete this exercise __________ times per day.  STRENGTH - External Rotators  Secure a rubber exercise band or tubing to a fixed object (table, pole) so that it is at the same height as your right / left elbow when you are standing or sitting on a firm surface.  Stand or sit so that the secured exercise band is at your uninjured side.  Bend your right / left elbow 90 degrees. Place a folded towel or small pillow under your right / left arm, so that your elbow is a few inches away from your side.  Keeping the tension on the exercise band, pull it away from your body, as if pivoting on your elbow. Be sure to keep your body steady, so that the movement is coming only from your rotating shoulder.  Hold for __________  seconds. Release the tension in a controlled manner, as you return to the starting position. Repeat __________ times. Complete this exercise __________ times per day.  STRENGTH - Internal Rotators   Secure a rubber exercise band or tubing to a fixed object (table, pole) so that it is at the same height as your right / left elbow when you are standing or sitting on a firm surface.  Stand or sit so that the secured exercise band is at your right / left side.  Bend your elbow 90 degrees. Place a folded towel or small pillow under your right / left arm so that your elbow is a few inches away from your side.  Keeping the tension on the exercise band, pull it across your body, toward your stomach. Be sure to keep your body steady, so that the movement is coming only from your rotating shoulder.  Hold for __________ seconds. Release the tension in a controlled manner, as you return to the starting position. Repeat __________ times. Complete this exercise __________ times per day.  STRENGTH - Scapular Protractors, Standing   Stand arms length away from a wall. Place your hands on the wall, keeping your elbows straight.  Begin by dropping your shoulder blades down and toward your mid-back spine.  To strengthen your protractors, keep your shoulder blades down, but slide them forward on your rib cage. It will feel as if you are lifting the back of your rib cage away from the wall. This is a subtle motion and can be challenging to complete. Ask your caregiver for further instruction, if you are not sure you are doing the exercise correctly.  Hold for __________ seconds. Slowly return to the starting position, resting the muscles completely before starting the next repetition. Repeat __________ times. Complete this exercise __________ times per day. STRENGTH - Scapular Protractors, Supine  Lie on your back on a firm surface. Extend your right / left arm straight into the air while holding a __________  weight in your hand.  Keeping your head and back in place, lift your shoulder off the floor.  Hold for __________ seconds. Slowly return to the starting position, and allow your muscles to relax completely before starting the next repetition. Repeat __________  times. Complete this exercise __________ times per day. STRENGTH - Scapular Protractors, Quadruped  Get onto your hands and knees, with your shoulders directly over your hands (or as close as you can be, comfortably).  Keeping your elbows locked, lift the back of your rib cage up into your shoulder blades, so your mid-back rounds out. Keep your neck muscles relaxed.  Hold this position for __________ seconds. Slowly return to the starting position and allow your muscles to relax completely before starting the next repetition. Repeat __________ times. Complete this exercise __________ times per day.  STRENGTH - Scapular Retractors  Secure a rubber exercise band or tubing to a fixed object (table, pole), so that it is at the height of your shoulders when you are either standing, or sitting on a firm armless chair.  With a palm down grip, grasp an end of the band in each hand. Straighten your elbows and lift your hands straight in front of you, at shoulder height. Step back, away from the secured end of the band, until it becomes tense.  Squeezing your shoulder blades together, draw your elbows back toward your sides, as you bend them. Keep your upper arms lifted away from your body throughout the exercise.  Hold for __________ seconds. Slowly ease the tension on the band, as you reverse the directions and return to the starting position. Repeat __________ times. Complete this exercise __________ times per day. STRENGTH - Shoulder Extensors   Secure a rubber exercise band or tubing to a fixed object (table, pole) so that it is at the height of your shoulders when you are either standing, or sitting on a firm armless chair.  With a  thumbs-up grip, grasp an end of the band in each hand. Straighten your elbows and lift your hands straight in front of you, at shoulder height. Step back, away from the secured end of the band, until it becomes tense.  Squeezing your shoulder blades together, pull your hands down to the sides of your thighs. Do not allow your hands to go behind you.  Hold for __________ seconds. Slowly ease the tension on the band, as you reverse the directions and return to the starting position. Repeat __________ times. Complete this exercise __________ times per day.  STRENGTH - Scapular Retractors and External Rotators   Secure a rubber exercise band or tubing to a fixed object (table, pole) so that it is at the height as your shoulders, when you are either standing, or sitting on a firm armless chair.  With a palm down grip, grasp an end of the band in each hand. Bend your elbows 90 degrees and lift your elbows to shoulder height, at your sides. Step back, away from the secured end of the band, until it becomes tense.  Squeezing your shoulder blades together, rotate your shoulders so that your upper arms and elbows remain stationary, but your fists travel upward to head height.  Hold for __________ seconds. Slowly ease the tension on the band, as you reverse the directions and return to the starting position. Repeat __________ times. Complete this exercise __________ times per day.  STRENGTH - Scapular Retractors and External Rotators, Rowing   Secure a rubber exercise band or tubing to a fixed object (table, pole) so that it is at the height of your shoulders, when you are either standing, or sitting on a firm armless chair.  With a palm down grip, grasp an end of the band in each hand. Straighten your elbows and  lift your hands straight in front of you, at shoulder height. Step back, away from the secured end of the band, until it becomes tense.  Step 1: Squeeze your shoulder blades together. Bending  your elbows, draw your hands to your chest, as if you are rowing a boat. At the end of this motion, your hands and elbow should be at shoulder height and your elbows should be out to your sides.  Step 2: Rotate your shoulders, to raise your hands above your head. Your forearms should be vertical and your upper arms should be horizontal.  Hold for __________ seconds. Slowly ease the tension on the band, as you reverse the directions and return to the starting position. Repeat __________ times. Complete this exercise __________ times per day.  STRENGTH - Scapular Depressors  Find a sturdy chair without wheels, such as a dining room chair.  Keeping your feet on the floor, and your hands on the chair arms, lift your bottom up from the seat, and lock your elbows.  Keeping your elbows straight, allow gravity to pull your body weight down. Your shoulders will rise toward your ears.  Raise your body against gravity by drawing your shoulder blades down your back, shortening the distance between your shoulders and ears. Although your feet should always maintain contact with the floor, your feet should progressively support less body weight, as you get stronger.  Hold for __________ seconds. In a controlled and slow manner, lower your body weight to begin the next repetition. Repeat __________ times. Complete this exercise __________ times per day.    This information is not intended to replace advice given to you by your health care provider. Make sure you discuss any questions you have with your health care provider.   Document Released: 01/15/2005 Document Revised: 02/05/2014 Document Reviewed: 04/29/2008 [employer] Interactive Patient Education [employer].

## 2015-12-09 NOTE — Assessment & Plan Note (Signed)
Symptoms and exam consistent with left shoulder tendinitis or possible subacromial impingement. Start ice and home exercise therapy and continue current dosage of naproxen. Refer to physical therapy. Declines cortisone injection. If symptoms worsen or do not improve consider additional imaging and/or cortisone injection.

## 2015-12-28 ENCOUNTER — Ambulatory Visit: Payer: Medicare Other | Attending: Family | Admitting: Physical Therapy

## 2015-12-28 ENCOUNTER — Encounter: Payer: Self-pay | Admitting: Physical Therapy

## 2015-12-28 DIAGNOSIS — G8929 Other chronic pain: Secondary | ICD-10-CM | POA: Diagnosis not present

## 2015-12-28 DIAGNOSIS — M62838 Other muscle spasm: Secondary | ICD-10-CM | POA: Diagnosis not present

## 2015-12-28 DIAGNOSIS — M6281 Muscle weakness (generalized): Secondary | ICD-10-CM | POA: Insufficient documentation

## 2015-12-28 DIAGNOSIS — M25512 Pain in left shoulder: Secondary | ICD-10-CM | POA: Insufficient documentation

## 2015-12-28 NOTE — Therapy (Addendum)
[facility] [address], [place], [place], [address] Phone: [phone]   Fax:  [phone]  Physical Therapy Evaluation  Patient Details  Name: James Myers MRN: [id number] Date of Birth: 11/23/1950 Referring Provider: Mauricio Po D FNP  Encounter Date: 12/28/2015      PT End of Session - 12/28/15 1430    Visit Number 1   Number of Visits 10   Date for PT Re-Evaluation 01/25/16   Authorization Type g code at 10th visit - Medicare   PT Start Time 1430   PT Stop Time 1520   PT Time Calculation (min) 50 min   Activity Tolerance Patient tolerated treatment well   Behavior During Therapy [facility] for tasks assessed/performed      Past Medical History:  Diagnosis Date  . Arthritis   . Brain aneurysm   . GERD (gastroesophageal reflux disease)   . Stroke [facility])     Past Surgical History:  Procedure Laterality Date  . TONSILLECTOMY AND ADENOIDECTOMY      There were no vitals filed for this visit.       Subjective Assessment - 12/28/15 1434    Subjective Pt has had Lt shoulder pain since about 2 months ago. Was worse but it has been getting better but just not going away.  Started taking aleve.  Pt may like to get back to swimming, basketball, golfing.   Patient Stated Goals reaching overhead, wants to be able to get back to exercises at [facility]   Currently in Pain? Yes   Pain Score 3    Pain Location Shoulder   Pain Orientation Left;Posterior   Pain Descriptors / Indicators Discomfort   Pain Frequency Intermittent   Aggravating Factors  moving   Pain Relieving Factors aleve, resting   Effect of Pain on Daily Activities unable to participate fully in functional and recreational activities   Multiple Pain Sites No            [facility] PT Assessment - 12/28/15 0001      Assessment   Medical Diagnosis M75.82 Lt shoulder tendinitis   Referring Provider Mauricio Po D FNP   Onset Date/Surgical Date  10/26/15   Hand Dominance Right   Prior Therapy no     Precautions   Precautions None     Restrictions   Weight Bearing Restrictions No     Balance Screen   Has the patient fallen in the past 6 months Yes  fell playing basketball   How many times? 1   Has the patient had a decrease in activity level because of a fear of falling?  No   Is the patient reluctant to leave their home because of a fear of falling?  No     Home Ecologist residence     Prior Function   Level of Independence Independent     Cognition   Overall Cognitive Status Within Functional Limits for tasks assessed     Observation/Other Assessments   Focus on Therapeutic Outcomes (FOTO)  45% limitation  goal 29% limitation     Posture/Postural Control   Posture/Postural Control Postural limitations   Postural Limitations Rounded Shoulders     AROM   Overall AROM Comments Lt shoulder flexion 165 degrees, Rt sholder flexion 171 degrees     Strength   Overall Strength Comments grossly 5/5 strength with pain in Lt shoulder     Palpation   Palpation comment Lt shoulder hypomobility and  anteriorly shifted in [facility] joint     Special Tests   Rotator Cuff Impingment tests Michel Bickers test;Empty Can test;Full Can test     Hawkins-Kennedy test   Findings Positive   Side Left     Empty Can test   Findings Negative   Side Left     Full Can test   Findings Positive   Side Left     Ambulation/Gait   Ambulation/Gait No                   [facility] Adult PT Treatment/Exercise - 12/28/15 0001      Shoulder Exercises: Standing   External Rotation Strengthening;10 reps;Theraband   Theraband Level (Shoulder External Rotation) Level 1 (Yellow)   Other Standing Exercises D1/2 with yellow band drawing sword - 10x     Shoulder Exercises: Stretch   Corner Stretch 2 reps;30 seconds   Cross Chest Stretch 2 reps;30 seconds     Manual Therapy   Manual Therapy Soft tissue  mobilization;Joint mobilization   Joint Mobilization A/P mobs to Lt glenohumeral joint grade II-III   Soft tissue mobilization Lt side: pecs, delts, infraspinatus and teres minor                PT Education - 12/28/15 1518    Education provided Yes   Education Details stretches and external rotation with band as seen in handout   Person(s) Educated Patient   Methods Explanation;Handout;Demonstration   Comprehension Verbalized understanding;Returned demonstration             PT Long Term Goals - 12/28/15 1523      PT LONG TERM GOAL #1   Title FOTO < or = 29% limitation   Baseline 45% limitation   Time 4   Period Weeks   Status New     PT LONG TERM GOAL #2   Title Pt will be indepenent with HEP   Time 4   Period Weeks   Status New     PT LONG TERM GOAL #3   Title Pt will report return to all activities with 1/10 pain at the most   Baseline 3/10 pain   Time 4   Period Weeks   Status New               Plan - 12/28/15 1728    Clinical Impression Statement Pt presents with some muscle spasm and irritation of rotator cuff.  Pt has hypmobility of Lt shoulder and some signs of impingment as well as pain with movements such as flexion and scaption.  Pt could benefit from several visits for pain management for 3/10 pain and strengthening in order to safely return to his active lifestyle.   Clinical Impairments Affecting Rehab Potential none   PT Frequency 1x / week   PT Duration 4 weeks   PT Treatment/Interventions Moist Heat;Patient/family education;Therapeutic exercise;Therapeutic activities;Neuromuscular re-education;Manual techniques   PT Next Visit Plan rotator cuff strengthening and stability exercises, mid/low scap strengthening and posture education, manual therapy as needed   PT Home Exercise Plan progress band exercises   Recommended Other Services none   Consulted and Agree with Plan of Care Patient      Patient will benefit from skilled  therapeutic intervention in order to improve the following deficits and impairments:  Decreased strength, Pain, Impaired UE functional use, Decreased range of motion, Increased muscle spasms, Postural dysfunction  Visit Diagnosis: Chronic left shoulder pain  Other muscle spasm  Muscle weakness (generalized)  G-Codes - 01-02-2016 1429    Functional Assessment Tool Used FOTO and clinical reasoning   Functional Limitation Carrying, moving and handling objects   Carrying, Moving and Handling Objects Current Status [phone]) At least 40 percent but less than 60 percent impaired, limited or restricted   Carrying, Moving and Handling Objects Goal Status ([id number]) At least 20 percent but less than 40 percent impaired, limited or restricted       01-02-2016 1429  PT G-Codes  Functional Assessment Tool Used FOTO and clinical reasoning byJacquelineDCrosser,PT atDecember 04, 171429   Functional Limitation Carrying, moving and handling objects byJacquelineDCrosser,PT at12/04/1427   Carrying, Moving and Handling Objects Current Status [phone]) At least 40 percent but less than 60 percent impaired, limited or restricted   Carrying, Moving and Handling Objects Goal Status (W5910) At least 20 percent but less than 40 percent impaired, limited or restricted   Carrying, Moving and Handling Objects Discharge Status [phone]) At least 40 percent but less than 60 percent impaired, limited or restricted    Zannie Cove, PT 02/14/16 3:21 PM   Problem List Patient Active Problem List   Diagnosis Date Noted  . Left shoulder tendinitis 12/09/2015  . Right shoulder pain 07/06/2014  . Routine general medical examination at a health care facility 12/01/2013  . Erectile dysfunction 11/19/2013  . GERD (gastroesophageal reflux disease) 11/19/2013  . Hand pain 11/19/2013    Zannie Cove, PT 02-Jan-2016, 5:41 PM  [facility] [address],  [place], [place], [address] Phone: [phone]   Fax:  [phone]  Name: James Myers MRN: [id number] Date of Birth: Jun 10, 1950  PHYSICAL THERAPY DISCHARGE SUMMARY  Visits from Start of Care: 1  Current functional level related to goals / functional outcomes: See above   Remaining deficits: Same as mentioned above   Education / Equipment: HEP Plan: Patient agrees to discharge.  Patient goals were not met. Patient is being discharged due to meeting the stated rehab goals.  ?????

## 2015-12-28 NOTE — Patient Instructions (Signed)
(  Clinic) External Rotation: With Abduction - Sitting    Opposite side toward pulley, left arm supported, elbow out, bent to 90, forearm across body. Rotate shoulder by pulling hand away from body. Keep elbow bent to 90. Repeat __15__ times per set. Do ____ sets per session. Do ____ sessions per week. Use ____ lb weights.  Copyright  VHI. All rights reserved.  (Clinic) PNF: D2 Flexion - Unilateral   Use ____ lb weights.  Copyright  VHI. All rights reserved.  Posterior Capsule Stretch    Stand or sit, one arm across body so hand rests over opposite shoulder. Gently push on crossed elbow with other hand until stretch is felt in shoulder of crossed arm. Hold _30__ seconds.  Repeat __3_ times per session. Do ___ sessions per day.  Copyright  VHI. All rights reserved.  Chest Flexibility: Shoulder Opener (Doorframe)    Roll shoulders back and down, pressing forward against doorframe. Hold for _30 s. Repeat _3___ times.  Copyright  VHI. All rights reserved.

## 2016-02-28 DIAGNOSIS — L812 Freckles: Secondary | ICD-10-CM | POA: Diagnosis not present

## 2016-02-28 DIAGNOSIS — M7130 Other bursal cyst, unspecified site: Secondary | ICD-10-CM | POA: Diagnosis not present

## 2016-02-28 DIAGNOSIS — L814 Other melanin hyperpigmentation: Secondary | ICD-10-CM | POA: Diagnosis not present

## 2016-02-28 DIAGNOSIS — Z85828 Personal history of other malignant neoplasm of skin: Secondary | ICD-10-CM | POA: Diagnosis not present

## 2016-02-28 DIAGNOSIS — L821 Other seborrheic keratosis: Secondary | ICD-10-CM | POA: Diagnosis not present

## 2016-02-28 DIAGNOSIS — L853 Xerosis cutis: Secondary | ICD-10-CM | POA: Diagnosis not present

## 2016-02-28 DIAGNOSIS — L570 Actinic keratosis: Secondary | ICD-10-CM | POA: Diagnosis not present

## 2016-08-28 DIAGNOSIS — L821 Other seborrheic keratosis: Secondary | ICD-10-CM | POA: Diagnosis not present

## 2016-08-28 DIAGNOSIS — D2271 Melanocytic nevi of right lower limb, including hip: Secondary | ICD-10-CM | POA: Diagnosis not present

## 2016-08-28 DIAGNOSIS — L570 Actinic keratosis: Secondary | ICD-10-CM | POA: Diagnosis not present

## 2016-08-28 DIAGNOSIS — L812 Freckles: Secondary | ICD-10-CM | POA: Diagnosis not present

## 2016-08-28 DIAGNOSIS — Z85828 Personal history of other malignant neoplasm of skin: Secondary | ICD-10-CM | POA: Diagnosis not present

## 2016-08-28 DIAGNOSIS — L578 Other skin changes due to chronic exposure to nonionizing radiation: Secondary | ICD-10-CM | POA: Diagnosis not present

## 2016-08-28 DIAGNOSIS — L853 Xerosis cutis: Secondary | ICD-10-CM | POA: Diagnosis not present

## 2016-08-28 DIAGNOSIS — D2272 Melanocytic nevi of left lower limb, including hip: Secondary | ICD-10-CM | POA: Diagnosis not present

## 2016-08-28 DIAGNOSIS — D225 Melanocytic nevi of trunk: Secondary | ICD-10-CM | POA: Diagnosis not present

## 2017-03-05 DIAGNOSIS — L812 Freckles: Secondary | ICD-10-CM | POA: Diagnosis not present

## 2017-03-05 DIAGNOSIS — D034 Melanoma in situ of scalp and neck: Secondary | ICD-10-CM | POA: Diagnosis not present

## 2017-03-05 DIAGNOSIS — L570 Actinic keratosis: Secondary | ICD-10-CM | POA: Diagnosis not present

## 2017-03-05 DIAGNOSIS — Z85828 Personal history of other malignant neoplasm of skin: Secondary | ICD-10-CM | POA: Diagnosis not present

## 2017-03-05 DIAGNOSIS — L905 Scar conditions and fibrosis of skin: Secondary | ICD-10-CM | POA: Diagnosis not present

## 2017-03-05 DIAGNOSIS — L578 Other skin changes due to chronic exposure to nonionizing radiation: Secondary | ICD-10-CM | POA: Diagnosis not present

## 2017-03-05 DIAGNOSIS — L814 Other melanin hyperpigmentation: Secondary | ICD-10-CM | POA: Diagnosis not present

## 2017-03-05 DIAGNOSIS — D485 Neoplasm of uncertain behavior of skin: Secondary | ICD-10-CM | POA: Diagnosis not present

## 2017-03-05 DIAGNOSIS — L821 Other seborrheic keratosis: Secondary | ICD-10-CM | POA: Diagnosis not present

## 2017-03-18 DIAGNOSIS — Z85828 Personal history of other malignant neoplasm of skin: Secondary | ICD-10-CM | POA: Diagnosis not present

## 2017-03-18 DIAGNOSIS — D034 Melanoma in situ of scalp and neck: Secondary | ICD-10-CM | POA: Diagnosis not present

## 2017-04-03 DIAGNOSIS — Z85828 Personal history of other malignant neoplasm of skin: Secondary | ICD-10-CM | POA: Diagnosis not present

## 2017-04-03 DIAGNOSIS — D034 Melanoma in situ of scalp and neck: Secondary | ICD-10-CM | POA: Diagnosis not present

## 2017-05-01 DIAGNOSIS — D034 Melanoma in situ of scalp and neck: Secondary | ICD-10-CM | POA: Diagnosis not present

## 2017-05-20 DIAGNOSIS — D492 Neoplasm of unspecified behavior of bone, soft tissue, and skin: Secondary | ICD-10-CM | POA: Diagnosis not present

## 2017-05-20 DIAGNOSIS — L989 Disorder of the skin and subcutaneous tissue, unspecified: Secondary | ICD-10-CM | POA: Diagnosis not present

## 2017-05-20 DIAGNOSIS — D034 Melanoma in situ of scalp and neck: Secondary | ICD-10-CM | POA: Diagnosis not present

## 2017-05-20 DIAGNOSIS — Z85828 Personal history of other malignant neoplasm of skin: Secondary | ICD-10-CM | POA: Diagnosis not present

## 2017-05-21 DIAGNOSIS — L988 Other specified disorders of the skin and subcutaneous tissue: Secondary | ICD-10-CM | POA: Diagnosis not present

## 2017-05-21 DIAGNOSIS — D034 Melanoma in situ of scalp and neck: Secondary | ICD-10-CM | POA: Diagnosis not present

## 2017-05-22 DIAGNOSIS — D034 Melanoma in situ of scalp and neck: Secondary | ICD-10-CM | POA: Diagnosis not present

## 2017-07-22 ENCOUNTER — Ambulatory Visit (INDEPENDENT_AMBULATORY_CARE_PROVIDER_SITE_OTHER): Payer: Medicare Other | Admitting: Adult Health

## 2017-07-22 ENCOUNTER — Encounter: Payer: Self-pay | Admitting: Adult Health

## 2017-07-22 ENCOUNTER — Encounter: Payer: Self-pay | Admitting: Gastroenterology

## 2017-07-22 VITALS — BP 120/70 | Temp 98.3°F | Wt 205.0 lb

## 2017-07-22 DIAGNOSIS — K2100 Gastro-esophageal reflux disease with esophagitis, without bleeding: Secondary | ICD-10-CM

## 2017-07-22 DIAGNOSIS — Z23 Encounter for immunization: Secondary | ICD-10-CM

## 2017-07-22 DIAGNOSIS — Z125 Encounter for screening for malignant neoplasm of prostate: Secondary | ICD-10-CM | POA: Diagnosis not present

## 2017-07-22 DIAGNOSIS — Z1211 Encounter for screening for malignant neoplasm of colon: Secondary | ICD-10-CM | POA: Diagnosis not present

## 2017-07-22 DIAGNOSIS — Z0000 Encounter for general adult medical examination without abnormal findings: Secondary | ICD-10-CM

## 2017-07-22 LAB — CBC WITH DIFFERENTIAL/PLATELET
BASOS PCT: 0.9 % (ref 0.0–3.0)
Basophils Absolute: 0 10*3/uL (ref 0.0–0.1)
Eosinophils Absolute: 0.2 10*3/uL (ref 0.0–0.7)
Eosinophils Relative: 5.2 % — ABNORMAL HIGH (ref 0.0–5.0)
HCT: 46.1 % (ref 39.0–52.0)
HEMOGLOBIN: 15.6 g/dL (ref 13.0–17.0)
Lymphocytes Relative: 24.5 % (ref 12.0–46.0)
Lymphs Abs: 1.2 10*3/uL (ref 0.7–4.0)
MCHC: 33.8 g/dL (ref 30.0–36.0)
MCV: 94.8 fl (ref 78.0–100.0)
Monocytes Absolute: 0.5 10*3/uL (ref 0.1–1.0)
Monocytes Relative: 10.5 % (ref 3.0–12.0)
Neutro Abs: 2.9 10*3/uL (ref 1.4–7.7)
Neutrophils Relative %: 58.9 % (ref 43.0–77.0)
PLATELETS: 229 10*3/uL (ref 150.0–400.0)
RBC: 4.86 Mil/uL (ref 4.22–5.81)
RDW: 14.9 % (ref 11.5–15.5)
WBC: 4.8 10*3/uL (ref 4.0–10.5)

## 2017-07-22 LAB — LIPID PANEL
CHOLESTEROL: 187 mg/dL (ref 0–200)
HDL: 56.5 mg/dL (ref >39.00)
LDL CALC: 110 mg/dL — AB (ref 0–99)
NonHDL: 130.22
Total CHOL/HDL Ratio: 3
Triglycerides: 99 mg/dL (ref 0.0–149.0)
VLDL: 19.8 mg/dL (ref 0.0–40.0)

## 2017-07-22 LAB — HEPATIC FUNCTION PANEL
ALBUMIN: 4.4 g/dL (ref 3.5–5.2)
ALT: 21 U/L (ref 0–53)
AST: 22 U/L (ref 0–37)
Alkaline Phosphatase: 50 U/L (ref 39–117)
BILIRUBIN TOTAL: 0.8 mg/dL (ref 0.2–1.2)
Bilirubin, Direct: 0.1 mg/dL (ref 0.0–0.3)
Total Protein: 6.8 g/dL (ref 6.0–8.3)

## 2017-07-22 LAB — BASIC METABOLIC PANEL
BUN: 23 mg/dL (ref 6–23)
CHLORIDE: 103 meq/L (ref 96–112)
CO2: 30 mEq/L (ref 19–32)
Calcium: 9.9 mg/dL (ref 8.4–10.5)
Creatinine, Ser: 1.12 mg/dL (ref 0.40–1.50)
GFR: 69.44 mL/min (ref >60.00)
Glucose, Bld: 106 mg/dL — ABNORMAL HIGH (ref 70–99)
Potassium: 4.3 mEq/L (ref 3.5–5.1)
Sodium: 139 mEq/L (ref 135–145)

## 2017-07-22 LAB — TSH: TSH: 3.19 u[IU]/mL (ref 0.35–4.50)

## 2017-07-22 LAB — PSA: PSA: 0.38 ng/mL (ref 0.10–4.00)

## 2017-07-22 NOTE — Progress Notes (Signed)
Patient presents to clinic today to establish care. He is a pleasant 67 year old male who  has a past medical history of Arthritis, Brain aneurysm, GERD (gastroesophageal reflux disease), and Stroke ([place]).  He is a previous patient of Diona Browner.    Acute Concerns: Establish Care/CPE   Chronic Issues:  S/p Stroke - 1991 - Subarachnoid aneurysm. Has no deficits   ED- takes generic Viagra PRN   GERD - takes Rollaids nightly. Continues to have issues with " acid in my mouth" on occasion ( once a month).  He has not tried any other medications. He is requesting an endoscopy.   Arthritis  - uses Buckeye - feels as though this works well.   Melanoma - h/o - is seen by [facility]   Health Maintenance: Dental -- UTD- Dr. Barbera Setters  Vision -- Does not do routine care  Immunizations -- He is due for Prevnar 13.  Colonoscopy -- Needs Diet: Tries to eat a heart healthy diet. His wife cooks him a well balanced diet.  Exercise: Goes to the [facility] [employer] and plays golf    Past Medical History:  Diagnosis Date  . Arthritis   . Brain aneurysm   . GERD (gastroesophageal reflux disease)   . Stroke [facility])     Past Surgical History:  Procedure Laterality Date  . TONSILLECTOMY AND ADENOIDECTOMY      Current Outpatient Medications on File Prior to Visit  Medication Sig Dispense Refill  . ferrous sulfate 325 (65 FE) MG tablet Take 1 tablet (325 mg total) by mouth daily with breakfast. 90 tablet 3  . Glucosamine-Chondroitin (OSTEO BI-FLEX REGULAR STRENGTH PO) Take by mouth 2 (two) times daily.     . naproxen (NAPROSYN) 500 MG tablet Take 1 tablet (500 mg total) by mouth 2 (two) times daily with a meal. 60 tablet 0  . sildenafil (REVATIO) 20 MG tablet Take 1 tablet (20 mg total) by mouth daily as needed (for erectile dysfunction). 10 tablet 0   No current facility-administered medications on file prior to visit.     No Known Allergies  Family History  Problem Relation  Age of Onset  . Stroke Father   . Arthritis Father     Social History   Socioeconomic History  . Marital status: Married    Spouse name: Not on file  . Number of children: 1  . Years of education: 60  . Highest education level: Not on file  Occupational History  . Occupation: Retired     Comment: Geophysicist/field seismologist for [employer]  . Financial resource strain: Not on file  . Food insecurity:    Worry: Not on file    Inability: Not on file  . Transportation needs:    Medical: Not on file    Non-medical: Not on file  Tobacco Use  . Smoking status: Never Smoker  . Smokeless tobacco: Never Used  Substance and Sexual Activity  . Alcohol use: Yes    Comment: 4 oz daily  . Drug use: No  . Sexual activity: Yes  Lifestyle  . Physical activity:    Days per week: Not on file    Minutes per session: Not on file  . Stress: Not on file  Relationships  . Social connections:    Talks on phone: Not on file    Gets together: Not on file    Attends religious service: Not on file    Active member of club or  organization: Not on file    Attends meetings of clubs or organizations: Not on file    Relationship status: Not on file  . Intimate partner violence:    Fear of current or ex partner: Not on file    Emotionally abused: Not on file    Physically abused: Not on file    Forced sexual activity: Not on file  Other Topics Concern  . Not on file  Social History Narrative   Born and raised in [place]. Currently resided in a private residence with his wife. Denies pets.    Fun: Golf and likes to swim / yard-work   Denies religious beliefs effecting healthcare.    Review of Systems  Constitutional: Negative.   HENT: Negative.   Eyes: Negative.   Respiratory: Negative.   Cardiovascular: Negative.   Gastrointestinal: Negative.   Genitourinary: Negative.   Musculoskeletal: Negative.   Skin: Negative.   Neurological: Negative.   Endo/Heme/Allergies: Negative.     Psychiatric/Behavioral: Negative.   All other systems reviewed and are negative.   BP 120/70   Temp 98.3 F (36.8 C) (Oral)   Wt 205 lb (93 kg)   BMI 27.80 kg/m   Physical Exam  Constitutional: He is oriented to person, place, and time. He appears well-developed and well-nourished. No distress.  HENT:  Head: Normocephalic and atraumatic.  Right Ear: External ear normal.  Left Ear: External ear normal.  Nose: Nose normal.  Mouth/Throat: Oropharynx is clear and moist. No oropharyngeal exudate.  Eyes: Pupils are equal, round, and reactive to light. Conjunctivae and EOM are normal. Right eye exhibits no discharge. Left eye exhibits no discharge. No scleral icterus.  Neck: Normal range of motion. Neck supple. No JVD present. No tracheal deviation present. No thyromegaly present.  Cardiovascular: Normal rate, regular rhythm, normal heart sounds and intact distal pulses. Exam reveals no gallop and no friction rub.  No murmur heard. Pulmonary/Chest: Effort normal and breath sounds normal. No stridor. No respiratory distress. He has no wheezes. He has no rales. He exhibits no tenderness.  Abdominal: Soft. Bowel sounds are normal. He exhibits no distension and no mass. There is no tenderness. There is no rebound and no guarding. No hernia.  Genitourinary:  Genitourinary Comments: Will do PSA   Musculoskeletal: Normal range of motion. He exhibits no edema, tenderness or deformity.  Lymphadenopathy:    He has no cervical adenopathy.  Neurological: He is alert and oriented to person, place, and time. He displays normal reflexes. No cranial nerve deficit or sensory deficit. He exhibits normal muscle tone. Coordination normal.  Skin: Skin is warm and dry. Capillary refill takes less than 2 seconds. No rash noted. He is not diaphoretic. No erythema. No pallor.  Various moles and Ak's scattered throughout torso  Psychiatric: He has a normal mood and affect. His behavior is normal. Judgment and  thought content normal.  Nursing note and vitals reviewed.   Assessment/Plan: 1. Routine general medical examination at a health care facility - Continue to diet and exercise  - Follow up in one year or sooner if needed - Basic metabolic panel - CBC with Differential/Platelet - Hepatic function panel - Lipid panel - TSH  2. Prostate cancer screening  - PSA  3. Colon cancer screening  - Ambulatory referral to Gastroenterology  4. Need for Streptococcus pneumoniae vaccination  - Pneumococcal conjugate vaccine 13-valent  5. Gastroesophageal reflux disease with esophagitis  - Ambulatory referral to Gastroenterology  Dorothyann Peng, NP

## 2017-07-22 NOTE — Patient Instructions (Signed)
It was a pleasure meeting you today   I will follow up with you regarding your blood work   Please let me know if you need anything  Health Maintenance, Male A healthy lifestyle and preventative care can promote health and wellness.  Maintain regular health, dental, and eye exams.  Eat a healthy diet. Foods like vegetables, fruits, whole grains, low-fat dairy products, and lean protein foods contain the nutrients you need and are low in calories. Decrease your intake of foods high in solid fats, added sugars, and salt. Get information about a proper diet from your health care provider, if necessary.  Regular physical exercise is one of the most important things you can do for your health. Most adults should get at least 150 minutes of moderate-intensity exercise (any activity that increases your heart rate and causes you to sweat) each week. In addition, most adults need muscle-strengthening exercises on 2 or more days a week.   Maintain a healthy weight. The body mass index (BMI) is a screening tool to identify possible weight problems. It provides an estimate of body fat based on height and weight. Your health care provider can find your BMI and can help you achieve or maintain a healthy weight. For males 20 years and older:  A BMI below 18.5 is considered underweight.  A BMI of 18.5 to 24.9 is normal.  A BMI of 25 to 29.9 is considered overweight.  A BMI of 30 and above is considered obese.  Maintain normal blood lipids and cholesterol by exercising and minimizing your intake of saturated fat. Eat a balanced diet with plenty of fruits and vegetables. Blood tests for lipids and cholesterol should begin at age 75 and be repeated every 5 years. If your lipid or cholesterol levels are high, you are over age 10, or you are at high risk for heart disease, you may need your cholesterol levels checked more frequently.Ongoing high lipid and cholesterol levels should be treated with medicines if  diet and exercise are not working.  If you smoke, find out from your health care provider how to quit. If you do not use tobacco, do not start.  Lung cancer screening is recommended for adults aged 36-80 years who are at high risk for developing lung cancer because of a history of smoking. A yearly low-dose CT scan of the lungs is recommended for people who have at least a 30-pack-year history of smoking and are current smokers or have quit within the past 15 years. A pack year of smoking is smoking an average of 1 pack of cigarettes a day for 1 year (for example, a 30-pack-year history of smoking could mean smoking 1 pack a day for 30 years or 2 packs a day for 15 years). Yearly screening should continue until the smoker has stopped smoking for at least 15 years. Yearly screening should be stopped for people who develop a health problem that would prevent them from having lung cancer treatment.  If you choose to drink alcohol, do not have more than 2 drinks per day. One drink is considered to be 12 oz (360 mL) of beer, 5 oz (150 mL) of wine, or 1.5 oz (45 mL) of liquor.  Avoid the use of street drugs. Do not share needles with anyone. Ask for help if you need support or instructions about stopping the use of drugs.  High blood pressure causes heart disease and increases the risk of stroke. High blood pressure is more likely to develop in:  People who have blood pressure in the end of the normal range (100-139/85-89 mm Hg).  People who are overweight or obese.  People who are African American.  If you are 60-4 years of age, have your blood pressure checked every 3-5 years. If you are 65 years of age or older, have your blood pressure checked every year. You should have your blood pressure measured twice--once when you are at a hospital or clinic, and once when you are not at a hospital or clinic. Record the average of the two measurements. To check your blood pressure when you are not at a  hospital or clinic, you can use:  An automated blood pressure machine at a pharmacy.  A home blood pressure monitor.  If you are 12-49 years old, ask your health care provider if you should take aspirin to prevent heart disease.  Diabetes screening involves taking a blood sample to check your fasting blood sugar level. This should be done once every 3 years after age 42 if you are at a normal weight and without risk factors for diabetes. Testing should be considered at a younger age or be carried out more frequently if you are overweight and have at least 1 risk factor for diabetes.  Colorectal cancer can be detected and often prevented. Most routine colorectal cancer screening begins at the age of 56 and continues through age 41. However, your health care provider may recommend screening at an earlier age if you have risk factors for colon cancer. On a yearly basis, your health care provider may provide home test kits to check for hidden blood in the stool. A small camera at the end of a tube may be used to directly examine the colon (sigmoidoscopy or colonoscopy) to detect the earliest forms of colorectal cancer. Talk to your health care provider about this at age 84 when routine screening begins. A direct exam of the colon should be repeated every 5-10 years through age 60, unless early forms of precancerous polyps or small growths are found.  People who are at an increased risk for hepatitis B should be screened for this virus. You are considered at high risk for hepatitis B if:  You were born in a country where hepatitis B occurs often. Talk with your health care provider about which countries are considered high risk.  Your parents were born in a high-risk country and you have not received a shot to protect against hepatitis B (hepatitis B vaccine).  You have HIV or AIDS.  You use needles to inject street drugs.  You live with, or have sex with, someone who has hepatitis B.  You are a  man who has sex with other men (MSM).  You get hemodialysis treatment.  You take certain medicines for conditions like cancer, organ transplantation, and autoimmune conditions.  Hepatitis C blood testing is recommended for all people born from [birth year] through 1965 and any individual with known risk factors for hepatitis C.  Healthy men should no longer receive prostate-specific antigen (PSA) blood tests as part of routine cancer screening. Talk to your health care provider about prostate cancer screening.  Testicular cancer screening is not recommended for adolescents or adult males who have no symptoms. Screening includes self-exam, a health care provider exam, and other screening tests. Consult with your health care provider about any symptoms you have or any concerns you have about testicular cancer.  Practice safe sex. Use condoms and avoid high-risk sexual practices to reduce the spread of  sexually transmitted infections (STIs).  You should be screened for STIs, including gonorrhea and chlamydia if:  You are sexually active and are younger than 24 years.  You are older than 24 years, and your health care provider tells you that you are at risk for this type of infection.  Your sexual activity has changed since you were last screened, and you are at an increased risk for chlamydia or gonorrhea. Ask your health care provider if you are at risk.  If you are at risk of being infected with HIV, it is recommended that you take a prescription medicine daily to prevent HIV infection. This is called pre-exposure prophylaxis (PrEP). You are considered at risk if:  You are a man who has sex with other men (MSM).  You are a heterosexual man who is sexually active with multiple partners.  You take drugs by injection.  You are sexually active with a partner who has HIV.  Talk with your health care provider about whether you are at high risk of being infected with HIV. If you choose to begin PrEP,  you should first be tested for HIV. You should then be tested every 3 months for as long as you are taking PrEP.  Use sunscreen. Apply sunscreen liberally and repeatedly throughout the day. You should seek shade when your shadow is shorter than you. Protect yourself by wearing long sleeves, pants, a wide-brimmed hat, and sunglasses year round whenever you are outdoors.  Tell your health care provider of new moles or changes in moles, especially if there is a change in shape or color. Also, tell your health care provider if a mole is larger than the size of a pencil eraser.  A one-time screening for abdominal aortic aneurysm (AAA) and surgical repair of large AAAs by ultrasound is recommended for men aged 14-75 years who are current or former smokers.  Stay current with your vaccines (immunizations).   This information is not intended to replace advice given to you by your health care provider. Make sure you discuss any questions you have with your health care provider.   Document Released: 07/14/2007 Document Revised: 02/05/2014 Document Reviewed: 06/12/2010 Elsevier Interactive Patient Education [employer].

## 2017-09-03 DIAGNOSIS — L821 Other seborrheic keratosis: Secondary | ICD-10-CM | POA: Diagnosis not present

## 2017-09-03 DIAGNOSIS — L812 Freckles: Secondary | ICD-10-CM | POA: Diagnosis not present

## 2017-09-03 DIAGNOSIS — D485 Neoplasm of uncertain behavior of skin: Secondary | ICD-10-CM | POA: Diagnosis not present

## 2017-09-03 DIAGNOSIS — Z85828 Personal history of other malignant neoplasm of skin: Secondary | ICD-10-CM | POA: Diagnosis not present

## 2017-09-03 DIAGNOSIS — Z85820 Personal history of malignant melanoma of skin: Secondary | ICD-10-CM | POA: Diagnosis not present

## 2017-09-03 DIAGNOSIS — L570 Actinic keratosis: Secondary | ICD-10-CM | POA: Diagnosis not present

## 2017-09-03 DIAGNOSIS — D1801 Hemangioma of skin and subcutaneous tissue: Secondary | ICD-10-CM | POA: Diagnosis not present

## 2017-09-24 ENCOUNTER — Encounter: Payer: Self-pay | Admitting: Gastroenterology

## 2017-09-24 ENCOUNTER — Ambulatory Visit (INDEPENDENT_AMBULATORY_CARE_PROVIDER_SITE_OTHER): Payer: Medicare Other | Admitting: Gastroenterology

## 2017-09-24 VITALS — BP 128/72 | HR 68 | Ht 72.0 in | Wt 205.6 lb

## 2017-09-24 DIAGNOSIS — K219 Gastro-esophageal reflux disease without esophagitis: Secondary | ICD-10-CM | POA: Diagnosis not present

## 2017-09-24 DIAGNOSIS — Z86010 Personal history of colonic polyps: Secondary | ICD-10-CM

## 2017-09-24 MED ORDER — PEG-KCL-NACL-NASULF-NA ASC-C 140 G PO SOLR: 140.0000 g | ORAL | 0 refills | Status: DC

## 2017-09-24 NOTE — Patient Instructions (Signed)
If you are age 67 or older, your body mass index should be between 23-30. Your Body mass index is 27.88 kg/m. If this is out of the aforementioned range listed, please consider follow up with your Primary Care Provider.  If you are age 61 or younger, your body mass index should be between 19-25. Your Body mass index is 27.88 kg/m. If this is out of the aformentioned range listed, please consider follow up with your Primary Care Provider.   You have been scheduled for an endoscopy and colonoscopy. Please follow the written instructions given to you at your visit today. Please pick up your prep supplies at the pharmacy within the next 1-3 days. If you use inhalers (even only as needed), please bring them with you on the day of your procedure. Your physician has requested that you go to www.startemmi.com and enter the access code given to you at your visit today. This web site gives a general overview about your procedure. However, you should still follow specific instructions given to you by our office regarding your preparation for the procedure.  It was a pleasure to see you today!  Dr. Loletha Carrow

## 2017-09-24 NOTE — Progress Notes (Signed)
[facility] Consult Note:  History: James Myers 09/24/2017  Referring physician: Dorothyann Peng, NP  Reason for consult/chief complaint: Gastroesophageal Reflux (Patient has had reflux for many years, acid regurgitation in throat at night once monthly) and Colon Cancer Screening (Recall, hx of colon poylps add EGD)   Subjective  HPI:  This is a very pleasant 67 year old man referred by primary care for history of reflux and history of colon polyps.  He reports about 15 years of heartburn symptoms that were more severe many years ago, now infrequent in the last several years.  He recalls an upper endoscopy and a screening colonoscopy about 10 years ago in [place].  He cannot recall that providers name and we do not have the records.  He recalls one or more polyps being discovered with a recommendation to return in 5 years, which he admits he did not get around to doing.  His heartburn was much worse at that time, he says he may changes in his diet and lifestyle and he has recently elevated the head of bed.  All of this seems to have improved symptoms, and now he gets about one episode per month of feeling pyrosis and fullness and coughing in the middle of the night.  He will take several Tums and things will settle down and he feels well until the next episode.  He denies dysphagia, odynophagia, nausea, vomiting, early satiety or weight loss.  His bowel habits are regular without rectal bleeding.   ROS:  Review of Systems  Constitutional: Negative for appetite change and unexpected weight change.  HENT: Negative for mouth sores and voice change.   Eyes: Negative for pain and redness.  Respiratory: Negative for cough and shortness of breath.   Cardiovascular: Negative for chest pain and palpitations.  Genitourinary: Negative for dysuria and hematuria.  Musculoskeletal: Positive for arthralgias. Negative for myalgias.  Skin: Negative for pallor and  rash.  Neurological: Negative for weakness and headaches.  Hematological: Negative for adenopathy.     Past Medical History: Past Medical History:  Diagnosis Date  . Arthritis   . Brain aneurysm   . Chicken pox   . GERD (gastroesophageal reflux disease)   . Melanoma ([place])   . Stroke [facility])      Past Surgical History: Past Surgical History:  Procedure Laterality Date  . BRAIN SURGERY  1991  . TONSILLECTOMY AND ADENOIDECTOMY       Family History: Family History  Problem Relation Age of Onset  . Aneurysm Mother        brain   . Arthritis Mother   . Stroke Father   . Arthritis Father   . Arthritis Maternal Grandmother   . Diabetes Maternal Grandfather   . Arthritis Paternal Grandmother     Social History: Social History   Socioeconomic History  . Marital status: Married    Spouse name: Not on file  . Number of children: 1  . Years of education: 63  . Highest education level: Not on file  Occupational History  . Occupation: Retired     Comment: Geophysicist/field seismologist for [employer]  . Financial resource strain: Not on file  . Food insecurity:    Worry: Not on file    Inability: Not on file  . Transportation needs:    Medical: Not on file    Non-medical: Not on file  Tobacco Use  . Smoking status: Never Smoker  . Smokeless tobacco: Never Used  Substance and  Sexual Activity  . Alcohol use: Yes    Comment: 4 oz daily  . Drug use: No  . Sexual activity: Yes  Lifestyle  . Physical activity:    Days per week: Not on file    Minutes per session: Not on file  . Stress: Not on file  Relationships  . Social connections:    Talks on phone: Not on file    Gets together: Not on file    Attends religious service: Not on file    Active member of club or organization: Not on file    Attends meetings of clubs or organizations: Not on file    Relationship status: Not on file  Other Topics Concern  . Not on file  Social History Narrative   Born and raised in  [place]. Currently resided in a private residence with his wife. Denies pets.    Fun: Golf and likes to swim / yard-work   Denies religious beliefs effecting healthcare.   One Child    Two grandchildren        Allergies: No Known Allergies  Outpatient Meds: Current Outpatient Medications  Medication Sig Dispense Refill  . calcium elemental as carbonate (BARIATRIC TUMS ULTRA) 400 MG chewable tablet Chew 1,000 mg by mouth daily.    . Glucosamine-Chondroitin (MOVE FREE PO) Take by mouth.    . Glucosamine-Chondroitin (OSTEO BI-FLEX REGULAR STRENGTH PO) Take by mouth 2 (two) times daily.     . Multiple Vitamins-Iron (MULTIVITAMINS WITH IRON) TABS tablet Take 1 tablet by mouth daily.    . naproxen (NAPROSYN) 500 MG tablet Take 1 tablet (500 mg total) by mouth 2 (two) times daily with a meal. 60 tablet 0  . naproxen sodium (ALEVE) 220 MG tablet Take 220 mg by mouth daily as needed.    . Omega-3 Fatty Acids (FISH OIL) 1200 MG CAPS Take 1 capsule by mouth daily.    . sildenafil (REVATIO) 20 MG tablet Take 1 tablet (20 mg total) by mouth daily as needed (for erectile dysfunction). 10 tablet 0  . Turmeric 450 MG CAPS Take 1 capsule by mouth 2 (two) times daily.    Marland Kitchen PEG-KCl-NaCl-NaSulf-Na Asc-C (PLENVU) 140 g SOLR Take 140 g by mouth as directed. 1 each 0   No current facility-administered medications for this visit.       ___________________________________________________________________ Objective   Exam:  BP 128/72   Pulse 68   Ht 6' (1.829 m)   Wt 205 lb 9.6 oz (93.3 kg)   BMI 27.88 kg/m    General: this is a(n) well-appearing man, normal vocal quality  Eyes: sclera anicteric, no redness  ENT: oral mucosa moist without lesions, no cervical or supraclavicular lymphadenopathy, good dentition  CV: RRR without murmur, S1/S2, no JVD, no peripheral edema  Resp: clear to auscultation bilaterally, normal RR and effort noted  GI: soft, no tenderness, with active bowel sounds. No  guarding or palpable organomegaly noted.  Skin; warm and dry, no rash or jaundice noted  Neuro: awake, alert and oriented x 3. Normal gross motor function and fluent speech  Labs:  CBC Latest Ref Rng & Units 07/22/2017 12/01/2013 11/25/2013  WBC 4.0 - 10.5 K/uL 4.8 4.7 5.1  Hemoglobin 13.0 - 17.0 g/dL 15.6 13.5 13.5  Hematocrit 39.0 - 52.0 % 46.1 41.9 42.7  Platelets 150.0 - 400.0 K/uL 229.0 278.0 309.0     Assessment: Encounter Diagnoses  Name Primary?  . Gastroesophageal reflux disease, esophagitis presence not specified Yes  . Personal history  of colonic polyps     I suspect he may be having more frequent episodes of nocturnal reflux and he realizes, and may just only be sensing the more severe episodes causing laryngeal pharyngeal reflux.  He has not unclear history of colon polyps, but feels strongly that he was advised to come back in about 5 years.  Those records are not likely available as far back in the past as that was done.  Plan:  Upper endoscopy to screen for Barrett's esophagus due to his age and duration of GERD symptoms. Surveillance colonoscopy for history of colon polyps. He is agreeable to both procedures after discussion of the risks and benefits.  The benefits and risks of the planned procedure were described in detail with the patient or (when appropriate) their health care proxy.  Risks were outlined as including, but not limited to, bleeding, infection, perforation, adverse medication reaction leading to cardiac or pulmonary decompensation, or pancreatitis (if ERCP).  The limitation of incomplete mucosal visualization was also discussed.  No guarantees or warranties were given.   Thank you for the courtesy of this consult.  Please call me with any questions or concerns.  Nelida Meuse III  CC: Dorothyann Peng, NP

## 2017-10-28 ENCOUNTER — Ambulatory Visit (INDEPENDENT_AMBULATORY_CARE_PROVIDER_SITE_OTHER): Payer: Medicare Other | Admitting: Adult Health

## 2017-10-28 ENCOUNTER — Ambulatory Visit (INDEPENDENT_AMBULATORY_CARE_PROVIDER_SITE_OTHER)
Admission: RE | Admit: 2017-10-28 | Discharge: 2017-10-28 | Disposition: A | Discharge status: Home / Self Care | Payer: Medicare Other | Source: Ambulatory Visit | Attending: Adult Health | Admitting: Adult Health

## 2017-10-28 ENCOUNTER — Telehealth: Payer: Self-pay | Admitting: Adult Health

## 2017-10-28 ENCOUNTER — Encounter: Payer: Self-pay | Admitting: Adult Health

## 2017-10-28 VITALS — BP 132/86 | HR 76 | Temp 97.8°F | Wt 207.0 lb

## 2017-10-28 DIAGNOSIS — R55 Syncope and collapse: Secondary | ICD-10-CM

## 2017-10-28 LAB — CBC WITH DIFFERENTIAL/PLATELET
BASOS ABS: 0 10*3/uL (ref 0.0–0.1)
Basophils Relative: 0.9 % (ref 0.0–3.0)
Eosinophils Absolute: 0.2 10*3/uL (ref 0.0–0.7)
Eosinophils Relative: 4.6 % (ref 0.0–5.0)
HCT: 42.8 % (ref 39.0–52.0)
Hemoglobin: 14.7 g/dL (ref 13.0–17.0)
Lymphocytes Relative: 23.3 % (ref 12.0–46.0)
Lymphs Abs: 1.1 10*3/uL (ref 0.7–4.0)
MCHC: 34.2 g/dL (ref 30.0–36.0)
MCV: 93.7 fl (ref 78.0–100.0)
Monocytes Absolute: 0.5 10*3/uL (ref 0.1–1.0)
Monocytes Relative: 11.2 % (ref 3.0–12.0)
NEUTROS ABS: 2.8 10*3/uL (ref 1.4–7.7)
Neutrophils Relative %: 60 % (ref 43.0–77.0)
PLATELETS: 216 10*3/uL (ref 150.0–400.0)
RBC: 4.57 Mil/uL (ref 4.22–5.81)
RDW: 13.9 % (ref 11.5–15.5)
WBC: 4.6 10*3/uL (ref 4.0–10.5)

## 2017-10-28 LAB — BASIC METABOLIC PANEL
BUN: 25 mg/dL — ABNORMAL HIGH (ref 6–23)
CALCIUM: 9.2 mg/dL (ref 8.4–10.5)
CO2: 30 meq/L (ref 19–32)
Chloride: 103 mEq/L (ref 96–112)
Creatinine, Ser: 1.08 mg/dL (ref 0.40–1.50)
GFR: 72.36 mL/min (ref >60.00)
Glucose, Bld: 99 mg/dL (ref 70–99)
Potassium: 4.3 mEq/L (ref 3.5–5.1)
SODIUM: 140 meq/L (ref 135–145)

## 2017-10-28 NOTE — Telephone Encounter (Signed)
I would have to exam him about the discomfort under his left ear   Please let him know that his blood work is normal from today

## 2017-10-28 NOTE — Progress Notes (Signed)
Subjective:    Patient ID: James Myers, male    DOB: 01-19-1951, 67 y.o.   MRN: [id number]  HPI 67 year old male who  has a past medical history of Arthritis, Brain aneurysm, Chicken pox, GERD (gastroesophageal reflux disease), Melanoma ([place]), and Stroke ([place]). His wife is with him at this visit   He presents to the office today for an acute issue of syncopal episode while sitting down at his computer he " passed out" and fell out of his chair onto the floor. He reports LOC of a " few seconds". His wife heard a "thud" and she came over to see what happened. She noticed that he was awake at this time and but his head was bleeding. Floor was hard wood. He reports that about 5 seconds prior to the incident he had a " sweeping sensation come over my head and I get a weird taste".   He denies any facial droop. Slurred speech, headaches, changes in vision, - prior to syncopal episode.   This has happened multiple times since he last saw me in June, all have been while sitting and have been proceeded by the " sweeping sensation that comes over my head"   He reports " feeling completely fine" since the syncopal episode   Review of Systems See HPI  Past Medical History:  Diagnosis Date  . Arthritis   . Brain aneurysm   . Chicken pox   . GERD (gastroesophageal reflux disease)   . Melanoma ([place])   . Stroke [facility])     Social History   Socioeconomic History  . Marital status: Married    Spouse name: Not on file  . Number of children: 1  . Years of education: 43  . Highest education level: Not on file  Occupational History  . Occupation: Retired     Comment: Geophysicist/field seismologist for [employer]  . Financial resource strain: Not on file  . Food insecurity:    Worry: Not on file    Inability: Not on file  . Transportation needs:    Medical: Not on file    Non-medical: Not on file  Tobacco Use  . Smoking status: Never Smoker  . Smokeless tobacco: Never Used  Substance and Sexual  Activity  . Alcohol use: Yes    Comment: 4 oz daily  . Drug use: No  . Sexual activity: Yes  Lifestyle  . Physical activity:    Days per week: Not on file    Minutes per session: Not on file  . Stress: Not on file  Relationships  . Social connections:    Talks on phone: Not on file    Gets together: Not on file    Attends religious service: Not on file    Active member of club or organization: Not on file    Attends meetings of clubs or organizations: Not on file    Relationship status: Not on file  . Intimate partner violence:    Fear of current or ex partner: Not on file    Emotionally abused: Not on file    Physically abused: Not on file    Forced sexual activity: Not on file  Other Topics Concern  . Not on file  Social History Narrative   Born and raised in [place]. Currently resided in a private residence with his wife. Denies pets.    Fun: Golf and likes to swim / yard-work   Denies religious beliefs effecting healthcare.  One Child    Two grandchildren        Past Surgical History:  Procedure Laterality Date  . BRAIN SURGERY  1991  . TONSILLECTOMY AND ADENOIDECTOMY      Family History  Problem Relation Age of Onset  . Aneurysm Mother        brain   . Arthritis Mother   . Stroke Father   . Arthritis Father   . Arthritis Maternal Grandmother   . Diabetes Maternal Grandfather   . Arthritis Paternal Grandmother     No Known Allergies  Current Outpatient Medications on File Prior to Visit  Medication Sig Dispense Refill  . calcium elemental as carbonate (BARIATRIC TUMS ULTRA) 400 MG chewable tablet Chew 1,000 mg by mouth daily.    . Glucosamine-Chondroitin (MOVE FREE PO) Take by mouth.    . Glucosamine-Chondroitin (OSTEO BI-FLEX REGULAR STRENGTH PO) Take by mouth 2 (two) times daily.     . Multiple Vitamins-Iron (MULTIVITAMINS WITH IRON) TABS tablet Take 1 tablet by mouth daily.    . naproxen (NAPROSYN) 500 MG tablet Take 1 tablet (500 mg total) by mouth  2 (two) times daily with a meal. 60 tablet 0  . naproxen sodium (ALEVE) 220 MG tablet Take 220 mg by mouth daily as needed.    . Omega-3 Fatty Acids (FISH OIL) 1200 MG CAPS Take 1 capsule by mouth daily.    Marland Kitchen PEG-KCl-NaCl-NaSulf-Na Asc-C (PLENVU) 140 g SOLR Take 140 g by mouth as directed. 1 each 0  . sildenafil (REVATIO) 20 MG tablet Take 1 tablet (20 mg total) by mouth daily as needed (for erectile dysfunction). 10 tablet 0  . Turmeric 450 MG CAPS Take 1 capsule by mouth 2 (two) times daily.     No current facility-administered medications on file prior to visit.     BP 132/86 (BP Location: Left Arm, Patient Position: Sitting, Cuff Size: Normal)   Pulse 76   Temp 97.8 F (36.6 C) (Oral)   Wt 207 lb (93.9 kg)   SpO2 96%   BMI 28.07 kg/m       Objective:   Physical Exam  Constitutional: He is oriented to person, place, and time. He appears well-developed and well-nourished. No distress.  HENT:  Head: Normocephalic and atraumatic.  Right Ear: External ear normal.  Left Ear: External ear normal.  Nose: Nose normal.  Mouth/Throat: Oropharynx is clear and moist. No oropharyngeal exudate.  Eyes: Pupils are equal, round, and reactive to light. Conjunctivae and EOM are normal. Right eye exhibits no discharge. Left eye exhibits no discharge. No scleral icterus.  Neck: Normal range of motion. Neck supple.  Cardiovascular: Regular rhythm, normal heart sounds and intact distal pulses. Bradycardia present. Exam reveals no gallop and no friction rub.  No murmur heard. Pulmonary/Chest: Effort normal and breath sounds normal.  Abdominal: Soft. Bowel sounds are normal. He exhibits no distension and no mass. There is no tenderness. There is no rebound and no guarding. No hernia.  Musculoskeletal: Normal range of motion. He exhibits no tenderness or deformity.  Neurological: He is alert and oriented to person, place, and time. He has normal strength and normal reflexes. He displays no tremor. No  cranial nerve deficit or sensory deficit. He exhibits normal muscle tone. He displays no seizure activity. Gait normal.  Skin: Skin is warm. He is not diaphoretic.  Two minor skin abrasions on crown of head   Psychiatric: He has a normal mood and affect. His behavior is normal. Judgment and thought  content normal.  Nursing note and vitals reviewed.     Assessment & Plan:  1. Syncope, unspecified syncope type - Unknown cause but due to history of will get STAT Head CT  - Likely referral to Neurology  - EKG 12-Lead- Sinus Loletha Grayer, Rate 54  - CT Head Wo Contrast; Future - Basic Metabolic Panel - CBC with Differential/Platelet  Dorothyann Peng, NP

## 2017-10-28 NOTE — Telephone Encounter (Signed)
Spoke to patient and informed him of his CT scan. No acute abnormality noted on CT head.   Will check carotid artery duplex and if normal send to neurology for further evaluation. He is ok with this plan

## 2017-10-28 NOTE — Telephone Encounter (Signed)
Copied from [place] [id number]. Topic: Quick Communication - See Telephone Encounter >> Oct 28, 2017  1:36 PM Conception Chancy, NT wrote: CRM for notification. See Telephone encounter for: 10/28/17.  Patient is calling back and was seen today in the office and forgot to mention to [facility] that he is having discomfort below his left ear.

## 2017-10-28 NOTE — Telephone Encounter (Signed)
Patient is aware of lab results.  Patient states that "his ear should be okay".

## 2017-10-29 ENCOUNTER — Encounter: Payer: Self-pay | Admitting: Adult Health

## 2017-10-29 ENCOUNTER — Telehealth: Payer: Self-pay | Admitting: Gastroenterology

## 2017-10-29 NOTE — Telephone Encounter (Signed)
Patient advised of plan, he had actually called yesterday and cancelled the procedures.

## 2017-10-29 NOTE — Telephone Encounter (Signed)
I heard from this patient's PCP that he has been having episodes of syncope (while sitting) for last several months, and is in process of workup.  CT head done - Cardiology consult and carotid ultrasound planned.  I believe Mr. Waymire has an EGD and colonoscopy scheduled soon, which must be postponed until after this testing is complete and we understand what is causing the episodes.  I am concerned there may be increased risks of sedating him at this time.  Please check the schedule for when the [facility] is supposed to be, then call him this AM.  PCP will let me know when testing done and we can discuss appropriate time to proceed with scopes.

## 2017-10-30 ENCOUNTER — Encounter: Payer: Medicare Other | Admitting: Gastroenterology

## 2017-11-06 ENCOUNTER — Ambulatory Visit
Admission: RE | Admit: 2017-11-06 | Discharge: 2017-11-06 | Disposition: A | Discharge status: Home / Self Care | Payer: Medicare Other | Source: Ambulatory Visit | Attending: Adult Health | Admitting: Adult Health

## 2017-11-06 DIAGNOSIS — I6523 Occlusion and stenosis of bilateral carotid arteries: Secondary | ICD-10-CM | POA: Diagnosis not present

## 2017-11-06 DIAGNOSIS — R55 Syncope and collapse: Secondary | ICD-10-CM

## 2017-11-07 ENCOUNTER — Encounter: Payer: Self-pay | Admitting: Adult Health

## 2017-11-07 ENCOUNTER — Telehealth: Payer: Self-pay | Admitting: Adult Health

## 2017-11-07 ENCOUNTER — Other Ambulatory Visit: Payer: Self-pay | Admitting: Adult Health

## 2017-11-07 DIAGNOSIS — R55 Syncope and collapse: Secondary | ICD-10-CM

## 2017-11-07 NOTE — Telephone Encounter (Signed)
Will send to [facility] for follow up

## 2017-11-07 NOTE — Telephone Encounter (Signed)
CT results negative - reviewed with patient already   Ultra Sound shows mild plaque build up in both carotid arteries. This is nothing to worry about at this point and we will keep an eye on it. This is not what is causing his symptoms. I actually think I would like him to see cardiology instead of neurology.

## 2017-11-07 NOTE — Telephone Encounter (Signed)
Pt is requesting results.  James Rumps, do you have an interpretation of James Myers CT Head? Pt called in asking about them and wanting to know if he is going to need to go to Neuro  Please advise.

## 2017-11-08 NOTE — Telephone Encounter (Signed)
Notes recorded by Virl Cagey, CMA on 11/07/2017 at 4:46 PM EDT Pt viewed on mychart  Discussed further with pt via phone.  Pt is okay with referral to Cards.  No further action required.

## 2017-11-15 ENCOUNTER — Telehealth: Payer: Self-pay | Admitting: Cardiovascular Disease

## 2017-11-15 NOTE — Telephone Encounter (Signed)
Faxed request for medical records to [facility] [phone] 11/15/17  LM

## 2017-11-20 ENCOUNTER — Encounter: Payer: Self-pay | Admitting: Adult Health

## 2017-11-21 ENCOUNTER — Encounter: Payer: Self-pay | Admitting: Cardiovascular Disease

## 2017-11-21 ENCOUNTER — Ambulatory Visit (INDEPENDENT_AMBULATORY_CARE_PROVIDER_SITE_OTHER): Payer: Medicare Other | Admitting: Cardiovascular Disease

## 2017-11-21 VITALS — BP 128/70 | HR 56 | Ht 72.0 in | Wt 207.8 lb

## 2017-11-21 DIAGNOSIS — I671 Cerebral aneurysm, nonruptured: Secondary | ICD-10-CM

## 2017-11-21 DIAGNOSIS — R0683 Snoring: Secondary | ICD-10-CM

## 2017-11-21 DIAGNOSIS — R400 Somnolence: Secondary | ICD-10-CM

## 2017-11-21 DIAGNOSIS — R55 Syncope and collapse: Secondary | ICD-10-CM | POA: Diagnosis not present

## 2017-11-21 NOTE — Patient Instructions (Addendum)
Medication Instructions:  Your physician recommends that you continue on your current medications as directed. Please refer to the Current Medication list given to you today.  If you need a refill on your cardiac medications before your next appointment, please call your pharmacy.   Testing/Procedures: Your physician has requested that you have an echocardiogram. Echocardiography is a painless test that uses sound waves to create images of your heart. It provides your doctor with information about the size and shape of your heart and how well your heart's chambers and valves are working. This procedure takes approximately one hour. There are no restrictions for this procedure.  This will be done at our [facility] location:  [place] has recommended that you wear an event monitor. Event monitors are medical devices that record the heart's electrical activity. Doctors most often [place] these monitors to diagnose arrhythmias. Arrhythmias are problems with the speed or rhythm of the heartbeat. The monitor is a small, portable device. You can wear one while you do your normal daily activities. This is usually used to diagnose what is causing palpitations/syncope (passing out).  Your physician has recommended that you have a sleep study. This test records several body functions during sleep, including: brain activity, eye movement, oxygen and carbon dioxide blood levels, heart rate and rhythm, breathing rate and rhythm, the flow of air through your mouth and nose, snoring, body muscle movements, and chest and belly movement.  Follow-Up: At [facility], you and your health needs are our priority.  As part of our continuing mission to provide you with exceptional heart care, we have created designated Provider Care Teams.  These Care Teams include your primary Cardiologist (physician) and Advanced Practice Providers (APPs -  Physician Assistants and Nurse Practitioners)  who all work together to provide you with the care you need, when you need it. You will need a follow up appointment in 6-8 weeks (ok to use hold).  Please call our office 2 months in advance to schedule this appointment.  You may see Dr. Claiborne Billings or one of the following Advanced Practice Providers on your designated Care Team: [place], [place] . Fabian Sharp, PA-C    You have been referred to Dr. Leonie Man with [facility]

## 2017-11-21 NOTE — Progress Notes (Signed)
Cardiology Office Note    Date:  11/22/2017   ID:  James Myers, DOB March 21, 1950, MRN [id number]  PCP:  Dorothyann Peng, NP  Cardiologist:  Shelva Majestic, MD   Cardiology evaluation referred through the courtesy of Dorothyann Peng, NP  History of Present Illness:  James Myers is a 67 y.o. male who is referred for cardiology evaluation of recurrent syncope through the courtesy of Sallee Provencal, NP.  James Myers is a retired gentleman who has remote history of a left frontotemporal craniotomy with clipping of a left parasellar aneurysm at [facility] in [place] in 1991.  He has a history of arthritis, GERD, and denies any awareness of cardiac issues.  Since February/March 2019 he has had recurrent episodes where he has had overt syncope which typically has occurred while sitting down.  He passed out at least 4 times.  He is unaware of any obvious seizure activity but his wife on one occasion did note with his eyes rolling back he was gasping for breath snoring and his arms were flailing above his head.  He denies any urinary or stool incontinence.  His last episode occurred approximately 3-1/2 weeks ago.  He states oftentimes he senses prior to this happening a wave coming from the back of his head to the front and he also notices a loss of vision from the top to the bottom.  These episodes are of short duration.  He has not been driving recently.  He was seen by Sallee Provencal, PA and he is referred for cardiology evaluation.  He underwent carotid duplex imaging on November 06, 2017 which showed small echogenic plaque at the right carotid bulb and left carotid bulb with estimated degree of stenosis in the internal carotid arteries less than 50% bilaterally.  A head CT did not reveal any acute intracranial abnormalities but showed changes of his prior left frontal temporal craniotomy with clipping of a left parasellar aneurysm.  He is now referred for cardiology evaluation.  He denies any  prodrome of chest pain.  He is unaware of any prodrome of either slow heart rate or significant palpitations.  The patient's wife states that he snores very loudly.  She has witnessed him gasping for breath.  He often can only sleep in 5-hour blocks.  He has nocturia at least 2-3 times per night.  He typically takes a daytime nap.  He presents for evaluation.   Past Medical History:  Diagnosis Date  . Arthritis   . Brain aneurysm   . Chicken pox   . GERD (gastroesophageal reflux disease)   . Melanoma ([place])   . Stroke [facility])     Past Surgical History:  Procedure Laterality Date  . BRAIN SURGERY  1991  . TONSILLECTOMY AND ADENOIDECTOMY      Current Medications: Outpatient Medications Prior to Visit  Medication Sig Dispense Refill  . Multiple Vitamins-Iron (MULTIVITAMINS WITH IRON) TABS tablet Take 1 tablet by mouth daily.    . naproxen (NAPROSYN) 500 MG tablet Take 1 tablet (500 mg total) by mouth 2 (two) times daily with a meal. 60 tablet 0  . Omega-3 Fatty Acids (FISH OIL) 1200 MG CAPS Take 1 capsule by mouth daily.    . Turmeric 500 MG CAPS Take 1 capsule by mouth 2 (two) times daily.    . calcium elemental as carbonate (BARIATRIC TUMS ULTRA) 400 MG chewable tablet Chew 1,000 mg by mouth daily.    . Glucosamine-Chondroitin (MOVE FREE PO) Take by mouth.    Marland Kitchen  Glucosamine-Chondroitin (OSTEO BI-FLEX REGULAR STRENGTH PO) Take by mouth 2 (two) times daily.     . naproxen sodium (ALEVE) 220 MG tablet Take 220 mg by mouth daily as needed.    Marland Kitchen PEG-KCl-NaCl-NaSulf-Na Asc-C (PLENVU) 140 g SOLR Take 140 g by mouth as directed. 1 each 0  . sildenafil (REVATIO) 20 MG tablet Take 1 tablet (20 mg total) by mouth daily as needed (for erectile dysfunction). 10 tablet 0  . Turmeric 450 MG CAPS Take 1 capsule by mouth 2 (two) times daily.     No facility-administered medications prior to visit.      Allergies:   Patient has no known allergies.   Social History   Socioeconomic History  .  Marital status: Married    Spouse name: Not on file  . Number of children: 1  . Years of education: 30  . Highest education level: Not on file  Occupational History  . Occupation: Retired     Comment: Geophysicist/field seismologist for [employer]  . Financial resource strain: Not on file  . Food insecurity:    Worry: Not on file    Inability: Not on file  . Transportation needs:    Medical: Not on file    Non-medical: Not on file  Tobacco Use  . Smoking status: Never Smoker  . Smokeless tobacco: Never Used  Substance and Sexual Activity  . Alcohol use: Yes    Comment: 4 oz daily  . Drug use: No  . Sexual activity: Yes  Lifestyle  . Physical activity:    Days per week: Not on file    Minutes per session: Not on file  . Stress: Not on file  Relationships  . Social connections:    Talks on phone: Not on file    Gets together: Not on file    Attends religious service: Not on file    Active member of club or organization: Not on file    Attends meetings of clubs or organizations: Not on file    Relationship status: Not on file  Other Topics Concern  . Not on file  Social History Narrative   Born and raised in [place]. Currently resided in a private residence with his wife. Denies pets.    Fun: Golf and likes to swim / yard-work   Denies religious beliefs effecting healthcare.   One Child    Two grandchildren        He moved to [facility] in 2013 to be near his grandchildren.  Family History:  The patient's family history includes Aneurysm in his mother; Arthritis in his father, maternal grandmother, mother, and paternal grandmother; Diabetes in his maternal grandfather; Parkinson's disease in his mother; Stroke in his father; Syncope episode in his father; Tremor in his brother.   ROS General: Negative; No fevers, chills, or night sweats;  HEENT: Negative; No changes in vision or hearing, sinus congestion, difficulty swallowing Pulmonary: Negative; No cough, wheezing, shortness  of breath, hemoptysis Cardiovascular:No chest pain, presyncope, syncope, palpitations GI: Negative; No nausea, vomiting, diarrhea, or abdominal pain GU: Negative; No dysuria, hematuria, or difficulty voiding Musculoskeletal: History of osteoarthritis involving his shoulders and hips Hematologic/Oncology: Negative; no easy bruising, bleeding Endocrine: Negative; no heat/cold intolerance; no diabetes Neuro: See HPI. Skin: Negative; No rashes or skin lesions Psychiatric: Negative; No behavioral problems, depression Sleep: Negative; No snoring, daytime sleepiness, hypersomnolence, bruxism, restless legs, hypnogognic hallucinations, no cataplexy Other comprehensive 14 point system review is negative.   PHYSICAL EXAM:  VS:  BP 128/70   Pulse (!) 56   Ht 6' (1.829 m)   Wt 207 lb 12.8 oz (94.3 kg)   BMI 28.18 kg/m     Repeat blood pressure by me was 122/70 supine and 120/70 standing  Wt Readings from Last 3 Encounters:  11/21/17 207 lb 12.8 oz (94.3 kg)  10/28/17 207 lb (93.9 kg)  09/24/17 205 lb 9.6 oz (93.3 kg)    General: Alert, oriented, no distress.  Skin: normal turgor, no rashes, warm and dry HEENT: Normocephalic, atraumatic. Pupils equal round and reactive to light; sclera anicteric; extraocular muscles intact; Fundi no hemorrhages or exudates.  Discs flat. Nose without nasal septal hypertrophy Mouth/Parynx benign; Mallinpatti scale 3 Neck: No JVD, no carotid bruits; normal carotid upstroke Lungs: clear to ausculatation and percussion; no wheezing or rales Chest wall: without tenderness to palpitation Heart: PMI not displaced, RRR, s1 s2 normal, 1/6 systolic murmur, no diastolic murmur, no rubs, gallops, thrills, or heaves Abdomen: soft, nontender; no hepatosplenomehaly, BS+; abdominal aorta nontender and not dilated by palpation. Back: no CVA tenderness Pulses 2+ Musculoskeletal: full range of motion, normal strength, no joint deformities Extremities: no clubbing cyanosis  or edema, Homan's sign negative  Neurologic: grossly nonfocal; Cranial nerves grossly wnl Psychologic: Normal mood and affect   Studies/Labs Reviewed:   EKG:  EKG is  ordered today.  ECG (independently read by me): Sinus bradycardia 56 bpm.  No ectopy.  Normal intervals.  Recent Labs: BMP Latest Ref Rng & Units 10/28/2017 07/22/2017 11/25/2013  Glucose 70 - 99 mg/dL 99 106(H) 101(H)  BUN 6 - 23 mg/dL 25(H) 23 21  Creatinine 0.40 - 1.50 mg/dL 1.08 1.12 1.2  Sodium 135 - 145 mEq/L 140 139 139  Potassium 3.5 - 5.1 mEq/L 4.3 4.3 4.3  Chloride 96 - 112 mEq/L 103 103 104  CO2 19 - 32 mEq/L '30 30 23  ' Calcium 8.4 - 10.5 mg/dL 9.2 9.9 9.1     Hepatic Function Latest Ref Rng & Units 07/22/2017  Total Protein 6.0 - 8.3 g/dL 6.8  Albumin 3.5 - 5.2 g/dL 4.4  AST 0 - 37 U/L 22  ALT 0 - 53 U/L 21  Alk Phosphatase 39 - 117 U/L 50  Total Bilirubin 0.2 - 1.2 mg/dL 0.8  Bilirubin, Direct 0.0 - 0.3 mg/dL 0.1    CBC Latest Ref Rng & Units 10/28/2017 07/22/2017 12/01/2013  WBC 4.0 - 10.5 K/uL 4.6 4.8 4.7  Hemoglobin 13.0 - 17.0 g/dL 14.7 15.6 13.5  Hematocrit 39.0 - 52.0 % 42.8 46.1 41.9  Platelets 150.0 - 400.0 K/uL 216.0 229.0 278.0   Lab Results  Component Value Date   MCV 93.7 10/28/2017   MCV 94.8 07/22/2017   MCV 82.9 12/01/2013   Lab Results  Component Value Date   TSH 3.19 07/22/2017   No results found for: HGBA1C   BNP No results found for: BNP  ProBNP No results found for: PROBNP   Lipid Panel     Component Value Date/Time   CHOL 187 07/22/2017 0947   TRIG 99.0 07/22/2017 0947   HDL 56.50 07/22/2017 0947   CHOLHDL 3 07/22/2017 0947   VLDL 19.8 07/22/2017 0947   LDLCALC 110 (H) 07/22/2017 0947     RADIOLOGY: Ct Head Wo Contrast  Result Date: 10/28/2017 CLINICAL DATA:  Several episodes of Near syncope for 6-7 months. 1 episode of syncope last month and again this am. Lac to top of head. Hx of aneurysm rupture and repair in 1991  in [place]: CT HEAD  WITHOUT CONTRAST TECHNIQUE: Contiguous axial images were obtained from the base of the skull through the vertex without intravenous contrast. COMPARISON:  None. FINDINGS: Brain: No acute infarction, hemorrhage or hydrocephalus. No masses or mass effect. There is encephalomalacia of the anterior left temporal lobe and a left parasellar aneurysm clip reflecting the previous aneurysm surgery reported history. Vascular: No hyperdense vessel or unexpected calcification. Skull: Status post left frontotemporal craniotomy. No fracture or acute finding. No bone lesion. Sinuses/Orbits: Visualize globes and orbits are unremarkable. The visualized sinuses and mastoid air cells are clear. Other: None. IMPRESSION: 1. No acute intracranial abnormalities. 2. Chronic changes from a prior left frontotemporal craniotomy with clipping of a left parasellar aneurysm. Electronically Signed   By: Lajean Manes M.D.   On: 10/28/2017 10:59   US Carotid Duplex Bilateral  Result Date: 11/06/2017 CLINICAL DATA:  Syncope and left facial aching. History of brain aneurysm. EXAM: BILATERAL CAROTID DUPLEX ULTRASOUND TECHNIQUE: Pearline Cables scale imaging, color Doppler and duplex ultrasound were performed of bilateral carotid and vertebral arteries in the neck. COMPARISON:  None. FINDINGS: Criteria: Quantification of carotid stenosis is based on velocity parameters that correlate the residual internal carotid diameter with NASCET-based stenosis levels, using the diameter of the distal internal carotid lumen as the denominator for stenosis measurement. The following velocity measurements were obtained: RIGHT ICA: 94 cm/sec CCA: 580 cm/sec SYSTOLIC ICA/CCA RATIO:  0.8 ECA: 139 cm/sec LEFT ICA: 89 cm/sec CCA: 998 cm/sec SYSTOLIC ICA/CCA RATIO:  0.7 ECA: 102 cm/sec RIGHT CAROTID ARTERY: Small amount of echogenic plaque at the right carotid bulb. External carotid artery is patent with normal waveform. Normal waveforms and velocities in the internal carotid  artery. RIGHT VERTEBRAL ARTERY: Antegrade flow and normal waveform in the right vertebral artery. LEFT CAROTID ARTERY: Small amount of plaque at the left carotid bulb. External carotid artery is patent with normal waveform. Normal waveforms and velocities in the internal carotid artery. LEFT VERTEBRAL ARTERY: Antegrade flow and normal waveform in the left vertebral artery. Upper extremity blood pressures: RIGHT: 137/72 LEFT: 132/82 IMPRESSION: Small amount of plaque at the carotid bulbs. Estimated degree of stenosis in the internal carotid arteries is less than 50% bilaterally. Patent vertebral arteries with antegrade flow. Electronically Signed   By: Markus Daft M.D.   On: 11/06/2017 17:19     Additional studies/ records that were reviewed today include:  I reviewed the office rest periods of James Myers navigator, NP.  I reviewed the carotid Doppler study and CT of his head.   ASSESSMENT:    1. Syncope, unspecified syncope type   2. Brain aneurysm   3. Loud snoring   4. Daytime sleepiness     PLAN:  James Myers is a 67 year old male who is unaware of any known cardiac history.  He has a remote history of clipping of aneurysm in 1991 and is status post left frontal temporal craniotomy at that time.  Over the past 6 months, he has had several episodes where he would no dizziness and on 4 occasions had frank syncope.  He denies any prodrome of arrhythmia but does admit to an aura where he would feel a wave coming from the back of his head to the front of his head and then he would lose vision from top downward.  These episodes have been short-lived but have been witnessed by his wife on one occasion where she did hear significant snoring and  and witnessed flailing of his arms.  From a cardiac perspective, I have recommended he undergo an echo Doppler study to assess for systolic and diastolic function and valvular architecture.  I am scheduling him for 30-day event monitor to assess cardiac rhythm  including bradycardia or tachycardia dysrhythmias.  He also has symptoms suggestive of sleep apnea and I am recommending he undergo a sleep study.  I have recommended he undergo a neurologic evaluation.  He may very well require an MR angiogram of his head as well as an EEG but I will defer scheduling of these to neurology.  I will see him back in follow-up of the above studies and further recommendations were made at that time.  Medication Adjustments/Labs and Tests Ordered: Current medicines are reviewed at length with the patient today.  Concerns regarding medicines are outlined above.  Medication changes, Labs and Tests ordered today are listed in the Patient Instructions below. Patient Instructions  Medication Instructions:  Your physician recommends that you continue on your current medications as directed. Please refer to the Current Medication list given to you today.  If you need a refill on your cardiac medications before your next appointment, please call your pharmacy.   Testing/Procedures: Your physician has requested that you have an echocardiogram. Echocardiography is a painless test that uses sound waves to create images of your heart. It provides your doctor with information about the size and shape of your heart and how well your heart's chambers and valves are working. This procedure takes approximately one hour. There are no restrictions for this procedure.  This will be done at our [facility] location:  [place] has recommended that you wear an event monitor. Event monitors are medical devices that record the heart's electrical activity. Doctors most often [place] these monitors to diagnose arrhythmias. Arrhythmias are problems with the speed or rhythm of the heartbeat. The monitor is a small, portable device. You can wear one while you do your normal daily activities. This is usually used to diagnose what is causing palpitations/syncope  (passing out).  Your physician has recommended that you have a sleep study. This test records several body functions during sleep, including: brain activity, eye movement, oxygen and carbon dioxide blood levels, heart rate and rhythm, breathing rate and rhythm, the flow of air through your mouth and nose, snoring, body muscle movements, and chest and belly movement.  Follow-Up: At [facility], you and your health needs are our priority.  As part of our continuing mission to provide you with exceptional heart care, we have created designated Provider Care Teams.  These Care Teams include your primary Cardiologist (physician) and Advanced Practice Providers (APPs -  Physician Assistants and Nurse Practitioners) who all work together to provide you with the care you need, when you need it. You will need a follow up appointment in 6-8 weeks (ok to use hold).  Please call our office 2 months in advance to schedule this appointment.  You may see Dr. Claiborne Billings or one of the following Advanced Practice Providers on your designated Care Team: [place], [place] . Fabian Sharp, PA-C    You have been referred to Dr. Leonie Man with [facility]        Signed, Shelva Majestic, MD  11/22/2017 6:44 PM    [place] [address], [place], [place], [place]  [address] Phone: [phone]

## 2017-11-22 ENCOUNTER — Encounter: Payer: Self-pay | Admitting: Adult Health

## 2017-11-22 ENCOUNTER — Encounter: Payer: Self-pay | Admitting: Cardiovascular Disease

## 2017-11-26 ENCOUNTER — Ambulatory Visit (HOSPITAL_COMMUNITY): Payer: Medicare Other | Attending: Cardiovascular Disease

## 2017-11-26 ENCOUNTER — Other Ambulatory Visit: Payer: Self-pay

## 2017-11-26 ENCOUNTER — Encounter: Payer: Self-pay | Admitting: Adult Health

## 2017-11-26 DIAGNOSIS — R55 Syncope and collapse: Secondary | ICD-10-CM | POA: Insufficient documentation

## 2017-11-26 NOTE — Telephone Encounter (Signed)
Noted  

## 2017-11-29 ENCOUNTER — Telehealth: Payer: Self-pay | Admitting: Cardiovascular Disease

## 2017-11-29 NOTE — Telephone Encounter (Signed)
Dr. Claiborne Billings reviewed-Responded to patient via Mychart

## 2017-11-29 NOTE — Telephone Encounter (Signed)
Study done 11/26/17 - waiting on review by provider. Routed to primary RN

## 2017-11-29 NOTE — Telephone Encounter (Signed)
New message ° ° °Patient is calling for echo results. °

## 2017-12-02 ENCOUNTER — Ambulatory Visit (INDEPENDENT_AMBULATORY_CARE_PROVIDER_SITE_OTHER): Payer: Medicare Other | Admitting: Neurology

## 2017-12-02 ENCOUNTER — Encounter: Payer: Self-pay | Admitting: Neurology

## 2017-12-02 VITALS — BP 131/58 | HR 48 | Ht 72.0 in | Wt 206.0 lb

## 2017-12-02 DIAGNOSIS — I671 Cerebral aneurysm, nonruptured: Secondary | ICD-10-CM | POA: Diagnosis not present

## 2017-12-02 DIAGNOSIS — G40109 Localization-related (focal) (partial) symptomatic epilepsy and epileptic syndromes with simple partial seizures, not intractable, without status epilepticus: Secondary | ICD-10-CM | POA: Diagnosis not present

## 2017-12-02 MED ORDER — LEVETIRACETAM ER 500 MG PO TB24: 1000.0000 mg | ORAL_TABLET | Freq: Every day | ORAL | Status: DC

## 2017-12-02 NOTE — Progress Notes (Signed)
[facility] [address] [place]. [place] [address] [phone]       OFFICE CONSULT NOTE  James. James Myers Date of Birth:  [birth year] Medical Record Number:  [id number]   Referring MD: Shelva Majestic Reason for Referral:  Passing out episodes HPI: James Myers is a pleasant 67 year old Caucasian male who has had multiple episodes of brief loss of consciousness since April 2017.  History is obtained from the James Myers and review of electronic medical records.  I personally reviewed imaging films.  He actually states that the few months prior to his first episode of passing out he used to have brief episodes of particular smell or taste and he would manage to control it and prevent it from getting worse.  The first episode he passed out while he was brushing teeth he had a feeling that something were to happen before he could control it he fell down got up quickly he was out for only few seconds.  Second episode occurred when he was outside all of a sudden his wife who was standing behind him noticed that he was leaning to one side and started snorting when his wife began to wake him up he started jerking his arms and then woke up and was quite disoriented for a few minutes.  The third episode he fell in the kitchen floor on the hardwood floor and woke up in a minute.  He is felt he did not have a warning.  The wife also reports other episodes like a staring episode she weakness in the gym about 4 weeks ago when the James Myers was staring and was briefly unresponsive and not talking within 30 seconds he started speaking.  James Myers has no prior history of known seizures significant head injury with loss of consciousness or intracranial issues except that he had a history of ruptured subarachnoid hemorrhage in 1991 for which he underwent craniotomy and surgical clipping in [place].  He apparently did quite well from that and had no significant physical deficits or cognitive issues.  He has been  seen by cardiology who have ordered an echocardiogram which was unremarkable.  Carotid ultrasound done on 11/06/2017 showed no significant extracranial stenosis.  CT scan of the head on 10/28/2017 showed old left craniotomy defect and aneurysm clip on the left side anteriorly.  No acute abnormality.  James Myers has not had an EEG.  He has not followed up with a neurologist.   It is unclear if he can have an MRI because his aneurysm clip was in [place] and I am not sure it is MRI compatible ROS:   14 system review of systems is positive for ringing in the ears, spinning sensation, skin moles, blurred vision, loss of vision, snoring, importance, anemia, joint pain and swelling, confusion, dizziness, seizure, passing out, snoring and all other systems negative  PMH:  Past Medical History:  Diagnosis Date  . Arthritis   . Brain aneurysm   . Chicken pox   . GERD (gastroesophageal reflux disease)   . Melanoma ([place])   . Stroke [facility])     Social History:  Social History   Socioeconomic History  . Marital status: Married    Spouse name: Not on file  . Number of children: 1  . Years of education: 74  . Highest education level: Not on file  Occupational History  . Occupation: Retired     Comment: Geophysicist/field seismologist for [employer]  . Financial resource strain: Not on file  . Food  insecurity:    Worry: Not on file    Inability: Not on file  . Transportation needs:    Medical: Not on file    Non-medical: Not on file  Tobacco Use  . Smoking status: Never Smoker  . Smokeless tobacco: Never Used  Substance and Sexual Activity  . Alcohol use: Yes    Comment: 4 oz daily  . Drug use: No  . Sexual activity: Yes  Lifestyle  . Physical activity:    Days per week: Not on file    Minutes per session: Not on file  . Stress: Not on file  Relationships  . Social connections:    Talks on phone: Not on file    Gets together: Not on file    Attends religious service: Not on file    Active member of  club or organization: Not on file    Attends meetings of clubs or organizations: Not on file    Relationship status: Not on file  . Intimate partner violence:    Fear of current or ex partner: Not on file    Emotionally abused: Not on file    Physically abused: Not on file    Forced sexual activity: Not on file  Other Topics Concern  . Not on file  Social History Narrative   Born and raised in [place]. Currently resided in a private residence with his wife. Denies pets.    Fun: Golf and likes to swim / yard-work   Denies religious beliefs effecting healthcare.   One Child    Two grandchildren        Medications:   Current Outpatient Medications on File Prior to Visit  Medication Sig Dispense Refill  . Calcium Carbonate Antacid (TUMS PO) Take by mouth.    . Multiple Vitamins-Iron (MULTIVITAMINS WITH IRON) TABS tablet Take 1 tablet by mouth daily.    . naproxen (NAPROSYN) 500 MG tablet Take 1 tablet (500 mg total) by mouth 2 (two) times daily with a meal. 60 tablet 0  . Omega-3 Fatty Acids (FISH OIL) 1200 MG CAPS Take 1 capsule by mouth daily.    . Turmeric 500 MG CAPS Take 1 capsule by mouth 2 (two) times daily.     No current facility-administered medications on file prior to visit.     Allergies:  No Known Allergies  Physical Exam General: well developed, well nourished middle-aged Caucasian male, seated, in no evident distress Head: head normocephalic and atraumatic.   Neck: supple with no carotid or supraclavicular bruits Cardiovascular: regular rate and rhythm, no murmurs Musculoskeletal: no deformity. Old craniotomy scar left frontal Skin:  no rash/petichiae Vascular:  Normal pulses all extremities  Neurologic Exam Mental Status: Awake and fully alert. Oriented to place and time. Recent and remote memory intact. Attention span, concentration and fund of knowledge appropriate. Mood and affect appropriate.  Diminished recall 2/3.  Animal naming test 13. Cranial Nerves:  Fundoscopic exam reveals sharp disc margins. Pupils equal, briskly reactive to light. Extraocular movements full without nystagmus. Visual fields full to confrontation. Hearing intact. Facial sensation intact. Face, tongue, palate moves normally and symmetrically.  Motor: Normal bulk and tone. Normal strength in all tested extremity muscles. Sensory.: intact to touch , pinprick , position and vibratory sensation.  Coordination: Rapid alternating movements normal in all extremities. Finger-to-nose and heel-to-shin performed accurately bilaterally. Gait and Station: Arises from chair without difficulty. Stance is normal. Gait demonstrates normal stride length and balance . Able to heel, toe and tandem walk  without difficulty.  Reflexes: 1+ and symmetric. Toes downgoing.   NIHSS 0 Modified [place]  0   ASSESSMENT: 67 year male with recurrent episodes of brief loss of consciousness as well as brief episodes of aura possibly complex partial seizures with and without secondary generalization.  Remote history of subarachnoid hemorrhage status post surgical aneurysm clipping  In 1991and seizures may be symptomatic late effect  from this.     PLAN: I had a long discussion with the James Myers and his wife regarding his recurrent stereotypical episodes of aura with and without brief loss of consciousness likely representing complex partial seizures and symptomatic epilepsy from his remote subarachnoid hemorrhage.  I recommend trial of Keppra XR 500 mg daily for 1 week to be increased to thousand milligrams daily if tolerated.  I discussed side effects with James Myers and wife and advised him to call me if needed.  Check EEG.  Check CT angiogram to look for aneurysm stability.  We will not check MRI since I cannot verify the MRI compatibility of his surgical clip.  He was advised not to drive for 6 months since his last episode of loss of consciousness as per [facility].  . Greater than 50% time during this  45-minute consultation visit was spent on counseling and coordination of care about seizures and cerebral aneurysm and answering questions he will return for follow-up after the tests in 2 months or call earlier if necessary. Antony Contras, MD  [facility] [address] [place], [place] [address]  Phone [phone] Fax [phone] Note: This document was prepared with digital dictation and possible smart phrase technology. Any transcriptional errors that result from this process are unintentional.

## 2017-12-02 NOTE — Patient Instructions (Signed)
I had a long discussion with the patient and his wife regarding his recurrent stereotypical episodes of aura with and without brief loss of consciousness likely representing complex partial seizures and symptomatic epilepsy from his remote subarachnoid hemorrhage.  I recommend trial of Keppra XR 500 mg daily for 1 week to be increased to thousand milligrams daily if tolerated.  I discussed side effects with patient and wife and advised him to call me if needed.  Check EEG.  Check CT angiogram to look for aneurysm stability.  We will not check MRI since I cannot verify the MRI compatibility of his surgical clip.  He was advised not to drive for 6 months since his last episode of loss of consciousness as per [facility].  He will return for follow-up after the tests in 2 months or call earlier if necessary.

## 2017-12-03 ENCOUNTER — Ambulatory Visit (INDEPENDENT_AMBULATORY_CARE_PROVIDER_SITE_OTHER): Payer: Medicare Other

## 2017-12-03 ENCOUNTER — Telehealth: Payer: Self-pay | Admitting: Neurology

## 2017-12-03 DIAGNOSIS — R55 Syncope and collapse: Secondary | ICD-10-CM | POA: Diagnosis not present

## 2017-12-03 NOTE — Telephone Encounter (Signed)
Patient is aware of this. He also has their number and will contact them if he has not heard .

## 2017-12-03 NOTE — Telephone Encounter (Signed)
Medicare/BCBS auth: [employer] via [employer] wesbite order sent to [facility]. They will reach out to the pt to schedule.

## 2017-12-04 ENCOUNTER — Telehealth: Payer: Self-pay | Admitting: Neurology

## 2017-12-04 ENCOUNTER — Other Ambulatory Visit: Payer: Self-pay

## 2017-12-04 MED ORDER — LEVETIRACETAM ER 500 MG PO TB24: 500.0000 mg | ORAL_TABLET | Freq: Every day | ORAL | 1 refills | Status: DC

## 2017-12-04 NOTE — Telephone Encounter (Signed)
Medication sent via epic.

## 2017-12-04 NOTE — Telephone Encounter (Signed)
Pt requesting rx for levETIRAcetam (KEPPRA XR) 24 hr tablet 1,000 mg sent to [facility] has not received rx

## 2017-12-04 NOTE — Telephone Encounter (Signed)
Verbal order from Dr Leonie Man for review and than will send to the pharmacy.

## 2017-12-11 ENCOUNTER — Ambulatory Visit
Admission: RE | Admit: 2017-12-11 | Discharge: 2017-12-11 | Disposition: A | Discharge status: Home / Self Care | Payer: Medicare Other | Source: Ambulatory Visit | Attending: Neurology | Admitting: Neurology

## 2017-12-11 DIAGNOSIS — I671 Cerebral aneurysm, nonruptured: Secondary | ICD-10-CM

## 2017-12-11 MED ORDER — IOPAMIDOL (ISOVUE-370) INJECTION 76%
75.0000 mL | Freq: Once | INTRAVENOUS | Status: AC | PRN
  Administered 2017-12-11: 75 mL via INTRAVENOUS

## 2017-12-16 ENCOUNTER — Telehealth: Payer: Self-pay

## 2017-12-16 NOTE — Telephone Encounter (Signed)
RN call patient that the Ct angio shows successfully coiled aneurysm without any recurrence.PT verbalized understanding. ------

## 2017-12-16 NOTE — Telephone Encounter (Signed)
-----   Message from Garvin Fila, MD sent at 12/14/2017 10:53 AM EST ----- Kindly inform patient that CT angio shows successfully coiled aneurysm without any recurrence

## 2017-12-23 ENCOUNTER — Telehealth: Payer: Self-pay | Admitting: Neurology

## 2017-12-23 NOTE — Telephone Encounter (Signed)
Rn call patient about if she should continue the [place]. Rn stated he should continue the keppra even if the eeg results are normal. RN recommend he continue the medication until he sees Dr.Sethi in the office. PT verbalized understanding.

## 2017-12-23 NOTE — Telephone Encounter (Signed)
Patient called and wants to know if he should take the [place] before his EEG like normal. Please call and advise.

## 2017-12-24 ENCOUNTER — Ambulatory Visit (INDEPENDENT_AMBULATORY_CARE_PROVIDER_SITE_OTHER): Payer: Medicare Other

## 2017-12-24 ENCOUNTER — Encounter

## 2017-12-24 DIAGNOSIS — G40109 Localization-related (focal) (partial) symptomatic epilepsy and epileptic syndromes with simple partial seizures, not intractable, without status epilepticus: Secondary | ICD-10-CM | POA: Diagnosis not present

## 2018-01-02 DIAGNOSIS — R55 Syncope and collapse: Secondary | ICD-10-CM | POA: Diagnosis not present

## 2018-01-07 DIAGNOSIS — L812 Freckles: Secondary | ICD-10-CM | POA: Diagnosis not present

## 2018-01-07 DIAGNOSIS — L821 Other seborrheic keratosis: Secondary | ICD-10-CM | POA: Diagnosis not present

## 2018-01-07 DIAGNOSIS — D1801 Hemangioma of skin and subcutaneous tissue: Secondary | ICD-10-CM | POA: Diagnosis not present

## 2018-01-07 DIAGNOSIS — Z85828 Personal history of other malignant neoplasm of skin: Secondary | ICD-10-CM | POA: Diagnosis not present

## 2018-01-07 DIAGNOSIS — L853 Xerosis cutis: Secondary | ICD-10-CM | POA: Diagnosis not present

## 2018-01-07 DIAGNOSIS — L570 Actinic keratosis: Secondary | ICD-10-CM | POA: Diagnosis not present

## 2018-01-07 DIAGNOSIS — Z85820 Personal history of malignant melanoma of skin: Secondary | ICD-10-CM | POA: Diagnosis not present

## 2018-01-07 NOTE — Telephone Encounter (Signed)
Records Received.

## 2018-01-16 ENCOUNTER — Encounter: Payer: Self-pay | Admitting: Cardiovascular Disease

## 2018-01-16 ENCOUNTER — Ambulatory Visit (INDEPENDENT_AMBULATORY_CARE_PROVIDER_SITE_OTHER): Payer: Medicare Other | Admitting: Cardiovascular Disease

## 2018-01-16 VITALS — BP 124/72 | HR 54 | Ht 72.0 in | Wt 209.6 lb

## 2018-01-16 DIAGNOSIS — I472 Ventricular tachycardia: Secondary | ICD-10-CM | POA: Diagnosis not present

## 2018-01-16 DIAGNOSIS — E785 Hyperlipidemia, unspecified: Secondary | ICD-10-CM

## 2018-01-16 DIAGNOSIS — R55 Syncope and collapse: Secondary | ICD-10-CM | POA: Diagnosis not present

## 2018-01-16 DIAGNOSIS — I4729 Other ventricular tachycardia: Secondary | ICD-10-CM

## 2018-01-16 DIAGNOSIS — G40209 Localization-related (focal) (partial) symptomatic epilepsy and epileptic syndromes with complex partial seizures, not intractable, without status epilepticus: Secondary | ICD-10-CM | POA: Diagnosis not present

## 2018-01-16 DIAGNOSIS — Z79899 Other long term (current) drug therapy: Secondary | ICD-10-CM

## 2018-01-16 MED ORDER — ROSUVASTATIN CALCIUM 10 MG PO TABS: 10.0000 mg | ORAL_TABLET | Freq: Every day | ORAL | 1 refills | Status: DC

## 2018-01-16 NOTE — Progress Notes (Signed)
Cardiology Office Note    Date:  01/18/2018   ID:  James Myers, DOB 12/09/1950, MRN [id number]  PCP:  Dorothyann Peng, James Myers  Cardiologist:  Shelva Majestic, MD   F/U cardiology evaluation initially referred through the courtesy of Dorothyann Peng, James Myers  History of Present Illness:  James Myers is a 67 y.o. male who was referred for cardiology evaluation of recurrent syncope through the courtesy of Sallee Provencal, James Myers.  I saw him for initial evaluation on November 21, 2017.  He presents for 63-monthfollow-up evaluation.  James Myers a retired gentleman who has remote history of a left [place]  He has a history of arthritis, GERD, and denies any awareness of cardiac issues.  Since February/March 2019 he has had recurrent episodes where he has had overt syncope which typically has occurred while sitting down.  He passed out at least 4 times.  He is unaware of any obvious seizure activity but his wife on one occasion did note with his eyes rolling back he was gasping for breath snoring and his arms were flailing above his head.  He denies any urinary or stool incontinence.  His last episode occurred approximately 3-1/2 weeks ago.  He states oftentimes he senses prior to this happening a wave coming from the back of his head to the front and he also notices a loss of vision from the top to the bottom.  These episodes are of short duration.  He has not been driving recently.  He was seen by CSallee Provencal PA and he is referred for cardiology evaluation.  He underwent carotid duplex imaging on November 06, 2017 which showed small echogenic plaque at the right carotid bulb and left carotid bulb with estimated degree of stenosis in the internal carotid arteries less than 50% bilaterally.  A head CT did not reveal any acute intracranial abnormalities but showed changes of his prior left frontal temporal  craniotomy with clipping of a left parasellar aneurysm.  He is now referred for cardiology evaluation.  When I initially saw him he denied any prodrome of chest pain.  He was unaware of any prodrome of either slow heart rate or significant palpitations.  The patient's wife stated that he snores very loudly.  She had witnessed him gasping for breath.  He often can only sleep in 5-hour blocks.  He was having nocturia at least 2-3 times per night and  typically takes a daytime nap.    I scheduled for a 2D echo Doppler study which was done on November 26, 2017 and was normal.  He had normal systolic and diastolic function, EF 60 to 65%.  There were no wall motion abnormalities.  He had normal valvular architecture.  I was concerned about potential seizure activity causing his symptoms and for this reason referred him to Dr. SLeonie Manfor neurologic evaluation.  He was evaluated on December 02, 2017.  There was some uncertainty if his aneurysm clip was MRI compatible and as result MRI imaging was not done.  However he underwent CT angio of his head which showed an aneurysm clip in the region of the left lateral cavernous carotid.  There was no recurrent aneurysm.  It was negative for any intracranial stenosis and negative for cerebral aneurysm elsewhere.  Dr. 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  He was started on Keppra XR 500 mg daily.  There was plans for an  EEG.  The plan was for him to titrate Keppra up to 1000 mg but the patient preferred to stay on his present dose of 500 and has remained stable and has not had any recurrent symptomatology.  He wore a 30-day event monitor from December 03, 2017 through January 01, 2018.  His predominant rhythm was sinus with an average at 68 bpm.  The slowest heart rate was sinus bradycardia at 47 bpm  which occurred while sleeping and the fastest heart rate was sinus tachycardia at 129 bpm during a period of exercise.  There was one brief episode of nonsustained ventricular tachycardia of 9 beats which occurred on December 04, 2017 at a rate of 123 bpm.  He was asymptomatic and this was automaticly triggered.  He is unaware of any palpitations.  He denies episodes of chest pain.  He feels better on Keppra.  He presents for follow-up Cardiologic evaluation.  Past Medical History:  Diagnosis Date  . Arthritis   . Brain aneurysm   . Chicken pox   . GERD (gastroesophageal reflux disease)   . Melanoma ([place])   . [facility]     Past Surgical History:  Procedure Laterality Date  . BRAIN SURGERY  1991  . TONSILLECTOMY AND ADENOIDECTOMY      Current Medications: Outpatient Medications Prior to Visit  Medication Sig Dispense Refill  . Calcium Carbonate Antacid (TUMS PO) Take by mouth.    . levETIRAcetam (KEPPRA XR) 500 MG 24 hr tablet Take 1 tablet (500 mg total) by mouth daily. Take 519m one tablet  daily for one week, than take 2 tablets daily ongoing 60 tablet 1  . Multiple Vitamins-Iron (MULTIVITAMINS WITH IRON) TABS tablet Take 1 tablet by mouth daily.    . naproxen (NAPROSYN) 500 MG tablet Take 1 tablet (500 mg total) by mouth 2 (two) times daily with a meal. 60 tablet 0  . Omega-3 Fatty Acids (FISH OIL) 1200 MG CAPS Take 1 capsule by mouth daily.    . Turmeric 500 MG CAPS Take 1 capsule by mouth 2 (two) times daily.     Facility-Administered Medications Prior to Visit  Medication Dose Route Frequency Provider Last Rate Last Dose  . levETIRAcetam (KEPPRA XR) 24 hr tablet 1,000 mg  1,000 mg Oral Daily SGarvin Fila MD         Allergies:   Patient has no known allergies.   Social History   Socioeconomic History  . Marital status: Married    Spouse name: Not on file  . Number of children: 1  . Years of education: 147 . Highest education level: Not on file  Occupational  History  . Occupation: Retired     Comment: LGeophysicist/field seismologistfor [employer] . Financial resource strain: Not on file  . Food insecurity:    Worry: Not on file    Inability: Not on file  . Transportation needs:    Medical: Not on file    Non-medical: Not on file  Tobacco Use  . Smoking status: Never Smoker  . Smokeless tobacco: Never Used  Substance and Sexual Activity  . Alcohol use: Yes    Comment: 4 oz daily  . Drug use: No  . Sexual activity: Yes  Lifestyle  . Physical activity:    Days per week: Not on file  Minutes per session: Not on file  . Stress: Not on file  Relationships  . Social connections:    Talks on phone: Not on file    Gets together: Not on file    Attends religious service: Not on file    Active member of club or organization: Not on file    Attends meetings of clubs or organizations: Not on file    Relationship status: Not on file  Other Topics Concern  . Not on file  Social History Narrative   Born and raised in [place]. Currently resided in a private residence with his wife. Denies pets.    Fun: Golf and likes to swim / yard-work   Denies religious beliefs effecting healthcare.   One Child    Two grandchildren        He moved to [facility] in 2013 to be near his grandchildren.  Family History:  The patient's family history includes Aneurysm in his mother; Arthritis in his father, maternal grandmother, mother, and paternal grandmother; Diabetes in his maternal grandfather; Parkinson's disease in his mother; Stroke in his father; Syncope episode in his father; Tremor in his brother.   ROS General: Negative; No fevers, chills, or night sweats;  HEENT: Negative; No changes in vision or hearing, sinus congestion, difficulty swallowing Pulmonary: Negative; No cough, wheezing, shortness of breath, hemoptysis Cardiovascular:No chest pain, presyncope, syncope, palpitations GI: Negative; No nausea, vomiting, diarrhea, or abdominal pain GU:  Negative; No dysuria, hematuria, or difficulty voiding Musculoskeletal: History of osteoarthritis involving his shoulders and hips Hematologic/Oncology: Negative; no easy bruising, bleeding Endocrine: Negative; no heat/cold intolerance; no diabetes Neuro: See HPI. Skin: Negative; No rashes or skin lesions Psychiatric: Negative; No behavioral problems, depression Sleep: Negative; No snoring, daytime sleepiness, hypersomnolence, bruxism, restless legs, hypnogognic hallucinations, no cataplexy Other comprehensive 14 point system review is negative.   PHYSICAL EXAM:   VS:  BP 124/72   Pulse (!) 54   Ht 6' (1.829 m)   Wt 209 lb 9.6 oz (95.1 kg)   BMI 28.43 kg/m     Repeat blood pressure by me was 122/70 supine and 120/70 standing  Wt Readings from Last 3 Encounters:  01/16/18 209 lb 9.6 oz (95.1 kg)  12/02/17 206 lb (93.4 kg)  11/21/17 207 lb 12.8 oz (94.3 kg)    General: Alert, oriented, no distress.  Skin: normal turgor, no rashes, warm and dry HEENT: Normocephalic, atraumatic. Pupils equal round and reactive to light; sclera anicteric; extraocular muscles intact;  Nose without nasal septal hypertrophy Mouth/Parynx benign; Mallinpatti scale 3 Neck: No JVD, no carotid bruits; normal carotid upstroke Lungs: clear to ausculatation and percussion; no wheezing or rales Chest wall: without tenderness to palpitation Heart: PMI not displaced, RRR, s1 s2 normal, trace systolic murmur, no diastolic murmur, no rubs, gallops, thrills, or heaves Abdomen: soft, nontender; no hepatosplenomehaly, BS+; abdominal aorta nontender and not dilated by palpation. Back: no CVA tenderness Pulses 2+ Musculoskeletal: full range of motion, normal strength, no joint deformities Extremities: no clubbing cyanosis or edema, Homan's sign negative  Neurologic: grossly nonfocal; Cranial nerves grossly wnl Psychologic: Normal mood and affect   Studies/Labs Reviewed:   EKG:  EKG is  ordered today.  ECG  (independently read by me): Sinus bradycardia 54 bpm.  No ectopy.  November 21, 2017 ECG (independently read by me): Sinus bradycardia 56 bpm.  No ectopy.  Normal intervals.  Recent Labs: BMP Latest Ref Rng & Units 10/28/2017 07/22/2017 11/25/2013  Glucose 70 - 99 mg/dL 99 106(H)  101(H)  BUN 6 - 23 mg/dL 25(H) 23 21  Creatinine 0.40 - 1.50 mg/dL 1.08 1.12 1.2  Sodium 135 - 145 mEq/L 140 139 139  Potassium 3.5 - 5.1 mEq/L 4.3 4.3 4.3  Chloride 96 - 112 mEq/L 103 103 104  CO2 19 - 32 mEq/L '30 30 23  ' Calcium 8.4 - 10.5 mg/dL 9.2 9.9 9.1     Hepatic Function Latest Ref Rng & Units 07/22/2017  Total Protein 6.0 - 8.3 g/dL 6.8  Albumin 3.5 - 5.2 g/dL 4.4  AST 0 - 37 U/L 22  ALT 0 - 53 U/L 21  Alk Phosphatase 39 - 117 U/L 50  Total Bilirubin 0.2 - 1.2 mg/dL 0.8  Bilirubin, Direct 0.0 - 0.3 mg/dL 0.1    CBC Latest Ref Rng & Units 10/28/2017 07/22/2017 12/01/2013  WBC 4.0 - 10.5 K/uL 4.6 4.8 4.7  Hemoglobin 13.0 - 17.0 g/dL 14.7 15.6 13.5  Hematocrit 39.0 - 52.0 % 42.8 46.1 41.9  Platelets 150.0 - 400.0 K/uL 216.0 229.0 278.0   Lab Results  Component Value Date   MCV 93.7 10/28/2017   MCV 94.8 07/22/2017   MCV 82.9 12/01/2013   Lab Results  Component Value Date   TSH 3.19 07/22/2017   No results found for: HGBA1C   BNP No results found for: BNP  ProBNP No results found for: PROBNP   Lipid Panel     Component Value Date/Time   CHOL 187 07/22/2017 0947   TRIG 99.0 07/22/2017 0947   HDL 56.50 07/22/2017 0947   CHOLHDL 3 07/22/2017 0947   VLDL 19.8 07/22/2017 0947   LDLCALC 110 (H) 07/22/2017 0947     RADIOLOGY: No results found.   Additional studies/ records that were reviewed today include:  I reviewed the office rest periods of James Corrigan, James Myers.  I reviewed the carotid Doppler study and CT of his head.  I reviewed the records from Dr. Leonie Man, his CT head angiographic evaluation.  I reviewed the echo Doppler study as well as his 30-day event  monitor.  ASSESSMENT:    1. Syncope, unspecified syncope type   2. Hyperlipidemia, unspecified hyperlipidemia type   3. Medication management   4. Possible partial symptomatic epilepsy with complex partial seizures, not intractable, without status epilepticus ([place])   5. NSVT (nonsustained ventricular tachycardia) (HCC)     PLAN:  James Myers is a 67 year old male who is unaware of any known cardiac history.  He has a remote history of clipping of aneurysm in 1991 and is underwent left frontal temporal craniotomy at that time.  Over the past 6 months, he has had several episodes where he would experience dizziness and on 4 occasions had frank syncope.  He denies any prodrome of arrhythmia but does admit to an aura where he would feel a wave coming from the back of his head to the front of his head and then he would lose vision from top downward.  These episodes have been short-lived but have been witnessed by his wife on one occasion where she did hear significant snoring and  and witnessed flailing of his arms.  I reviewed his Cardiologic work-up to date.  His echo Doppler study was essentially normal.  EF was 60 to 65% without wall motion abnormalities or abnormal valvular architecture.  He was referred to Dr. Leonie Man who felt his symptoms were highly suggestive of complex partial seizures and symptomatic epilepsy resulting from his remote subarachnoid hemorrhage.  His symptoms have resolved with institution  of Keppra and he currently is at 500 mg daily.  I reviewed his 30-day event monitor which shows predominantly sinus rhythm with one episode of asymptomatic nonsustained ventricular tachycardia at 123 bpm.  I do not believe his symptoms are resulting from cardiac arrhythmia.  Structurally he has normal systolic and diastolic function.  He is bradycardic with heart rates in the 54 range at rest and as result I will not institute any beta-blocker therapy.  He is able to exercise appropriately and on  monitoring with exercise his heart rate increased to sinus tachycardia at 129 bpm.  He will be undergoing follow-up evaluation with Dr. Leonie Man.  Presently, he would prefer to defer an evaluation for sleep apnea which we discussed at his initial office visit with me.  I reviewed laboratory.  I have recommended initiation of rosuvastatin 10 mg for mild hyperlipidemia with his LDL at 110.  Repeat laboratory will be obtained in 3 months in the fasting state.  I will see him in 6 months for follow-up evaluation.   Medication Adjustments/Labs and Tests Ordered: Current medicines are reviewed at length with the patient today.  Concerns regarding medicines are outlined above.  Medication changes, Labs and Tests ordered today are listed in the Patient Instructions below. Patient Instructions  Medication Instructions:  Start Rosuvastatin 10 mg daily. If you need a refill on your cardiac medications before your next appointment, please call your pharmacy.   Lab work: Lipid, CMET 3 months. If you have labs (blood work) drawn today and your tests are completely normal, you will receive your results only by: Marland Kitchen MyChart Message (if you have MyChart) OR . A paper copy in the mail If you have any lab test that is abnormal or we need to change your treatment, we will call you to review the results.  Follow-Up: At [facility], you and your health needs are our priority.  As part of our continuing mission to provide you with exceptional heart care, we have created designated Provider Care Teams.  These Care Teams include your primary Cardiologist (physician) and Advanced Practice Providers (APPs -  Physician Assistants and Nurse Practitioners) who all work together to provide you with the care you need, when you need it. You will need a follow up appointment in 6 months.  Please call our office 2 months in advance to schedule this appointment.  You may see Dr.Melainie Krinsky or one of the following Advanced Practice Providers  on your designated Care Team: Almyra Deforest, [place] . Fabian Sharp, PA-C       Signed, Shelva Majestic, MD  01/18/2018 2:12 PM    [facility] [address] [place], [place], [place]  [address] Phone: [phone]

## 2018-01-16 NOTE — Patient Instructions (Addendum)
Medication Instructions:  Start Rosuvastatin 10 mg daily. If you need a refill on your cardiac medications before your next appointment, please call your pharmacy.   Lab work: Lipid, CMET 3 months. If you have labs (blood work) drawn today and your tests are completely normal, you will receive your results only by: Marland Kitchen MyChart Message (if you have MyChart) OR . A paper copy in the mail If you have any lab test that is abnormal or we need to change your treatment, we will call you to review the results.  Follow-Up: At [facility], you and your health needs are our priority.  As part of our continuing mission to provide you with exceptional heart care, we have created designated Provider Care Teams.  These Care Teams include your primary Cardiologist (physician) and Advanced Practice Providers (APPs -  Physician Assistants and Nurse Practitioners) who all work together to provide you with the care you need, when you need it. You will need a follow up appointment in 6 months.  Please call our office 2 months in advance to schedule this appointment.  You may see Dr.Kelly or one of the following Advanced Practice Providers on your designated Care Team: Almyra Deforest, [place] . Fabian Sharp, PA-C

## 2018-01-17 ENCOUNTER — Ambulatory Visit: Payer: Medicare Other | Admitting: Cardiovascular Disease

## 2018-01-18 ENCOUNTER — Encounter: Payer: Self-pay | Admitting: Cardiovascular Disease

## 2018-02-11 ENCOUNTER — Encounter: Payer: Self-pay | Admitting: Neurology

## 2018-02-11 ENCOUNTER — Ambulatory Visit (INDEPENDENT_AMBULATORY_CARE_PROVIDER_SITE_OTHER): Payer: Medicare Other | Admitting: Neurology

## 2018-02-11 VITALS — BP 119/72 | HR 64 | Ht 72.0 in | Wt 209.6 lb

## 2018-02-11 DIAGNOSIS — G40209 Localization-related (focal) (partial) symptomatic epilepsy and epileptic syndromes with complex partial seizures, not intractable, without status epilepticus: Secondary | ICD-10-CM | POA: Diagnosis not present

## 2018-02-11 MED ORDER — LEVETIRACETAM ER 500 MG PO TB24: 500.0000 mg | ORAL_TABLET | Freq: Every day | ORAL | 1 refills | Status: DC

## 2018-02-11 NOTE — Patient Instructions (Signed)
I had a long discussion with the patient regarding his symptoms of possible auto and partial complex seizures which appear to have responded to low-dose Keppra.  Continue Keppra XR in the current dose of 500 mg daily as patient is reluctant to increase the dose.  He was asked to avoid seizure provoking stimuli like sleep deprivation, irregular eating and sleeping habits and stimulants.  He will return for follow-up in 6 months with my nurse practitioner Janett Billow or call earlier if necessary.

## 2018-02-11 NOTE — Progress Notes (Signed)
[facility] [address] [place]. [place] [address] [phone]       OFFICE CONSULT NOTE  Mr. Haziel Molner Date of Birth:  [birth year] Medical Record Number:  [id number]   Referring MD: Shelva Majestic Reason for Referral:  Passing out episodes HPI: Initial visit 12/02/2017 ;Mr Laswell is a pleasant 68 year old Caucasian male who has had multiple episodes of brief loss of consciousness since April 2017.  History is obtained from the patient and review of electronic medical records.  I personally reviewed imaging films.  He actually states that the few months prior to his first episode of passing out he used to have brief episodes of particular smell or taste and he would manage to control it and prevent it from getting worse.  The first episode he passed out while he was brushing teeth he had a feeling that something were to happen before he could control it he fell down got up quickly he was out for only few seconds.  Second episode occurred when he was outside all of a sudden his wife who was standing behind him noticed that he was leaning to one side and started snorting when his wife began to wake him up he started jerking his arms and then woke up and was quite disoriented for a few minutes.  The third episode he fell in the kitchen floor on the hardwood floor and woke up in a minute.  He is felt he did not have a warning.  The wife also reports other episodes like a staring episode she weakness in the gym about 4 weeks ago when the patient was staring and was briefly unresponsive and not talking within 30 seconds he started speaking.  Patient has no prior history of known seizures significant head injury with loss of consciousness or intracranial issues except that he had a history of ruptured subarachnoid hemorrhage in 1991 for which he underwent craniotomy and surgical clipping in [place].  He apparently did quite well from that and had no significant physical deficits or  cognitive issues.  He has been seen by cardiology who have ordered an echocardiogram which was unremarkable.  Carotid ultrasound done on 11/06/2017 showed no significant extracranial stenosis.  CT scan of the head on 10/28/2017 showed old left craniotomy defect and aneurysm clip on the left side anteriorly.  No acute abnormality.  Patient has not had an EEG.  He has not followed up with a neurologist.   It is unclear if he can have an MRI because his aneurysm clip was in 1991 and I am not sure it is MRI compatible Update 02/11/2018 ; he returns for follow-up after last visit 2 months ago.  He states he has had no further episodes of aura or passing out or seizure-like episodes.  He is still on Keppra XR 500 mg daily and states that he felt tired and hence did not feel like he needed to increase the dose further.  He complains of occasional positional dizziness when he bends down.  He is otherwise doing well without any new symptoms.  He had EEG done on 12/31/2017 which I personally reviewed and was normal.  He had CT angiogram of the brain done on 12/11/2017 which also have reviewed shows no new aneurysms.  The previous surgical the clipped aneurysm is noted in the left cavernous distal carotid.  Patient has no new complaints today. ROS:   14 system review of systems is positive for ringing in the ears, spinning sensation,  dizziness,   and all other systems negative  PMH:  Past Medical History:  Diagnosis Date  . Arthritis   . Brain aneurysm   . Chicken pox   . GERD (gastroesophageal reflux disease)   . Melanoma ([facility])   . Stroke [facility])     Social History:  Social History   Socioeconomic History  . Marital status: Married    Spouse name: Not on file  . Number of children: 1  . Years of education: 59  . Highest education level: Not on file  Occupational History  . Occupation: Retired     Comment: Geophysicist/field seismologist for [employer]  . Financial resource strain: Not on file  . Food  insecurity:    Worry: Not on file    Inability: Not on file  . Transportation needs:    Medical: Not on file    Non-medical: Not on file  Tobacco Use  . Smoking status: Never Smoker  . Smokeless tobacco: Never Used  Substance and Sexual Activity  . Alcohol use: Yes    Comment: 4 oz daily  . Drug use: No  . Sexual activity: Yes  Lifestyle  . Physical activity:    Days per week: Not on file    Minutes per session: Not on file  . Stress: Not on file  Relationships  . Social connections:    Talks on phone: Not on file    Gets together: Not on file    Attends religious service: Not on file    Active member of club or organization: Not on file    Attends meetings of clubs or organizations: Not on file    Relationship status: Not on file  . Intimate partner violence:    Fear of current or ex partner: Not on file    Emotionally abused: Not on file    Physically abused: Not on file    Forced sexual activity: Not on file  Other Topics Concern  . Not on file  Social History Narrative   Born and raised in [place]. Currently resided in a private residence with his wife. Denies pets.    Fun: Golf and likes to swim / yard-work   Denies religious beliefs effecting healthcare.   One Child    Two grandchildren        Medications:   Current Outpatient Medications on File Prior to Visit  Medication Sig Dispense Refill  . Calcium Carbonate Antacid (TUMS PO) Take by mouth.    . Multiple Vitamins-Iron (MULTIVITAMINS WITH IRON) TABS tablet Take 1 tablet by mouth daily.    . rosuvastatin (CRESTOR) 10 MG tablet Take 1 tablet (10 mg total) by mouth daily. 90 tablet 1  . naproxen (NAPROSYN) 500 MG tablet Take 1 tablet (500 mg total) by mouth 2 (two) times daily with a meal. (Patient not taking: Reported on 02/11/2018) 60 tablet 0   Current Facility-Administered Medications on File Prior to Visit  Medication Dose Route Frequency Provider Last Rate Last Dose  . levETIRAcetam (KEPPRA XR) 24 hr  tablet 1,000 mg  1,000 mg Oral Daily Garvin Fila, MD        Allergies:  No Known Allergies  Physical Exam General: well developed, well nourished middle-aged Caucasian male, seated, in no evident distress Head: head normocephalic and atraumatic.   Neck: supple with no carotid or supraclavicular bruits Cardiovascular: regular rate and rhythm, no murmurs Musculoskeletal: no deformity. Old craniotomy scar left frontal Skin:  no rash/petichiae Vascular:  Normal  pulses all extremities  Neurologic Exam Mental Status: Awake and fully alert. Oriented to place and time. Recent and remote memory intact. Attention span, concentration and fund of knowledge appropriate. Mood and affect appropriate.  Diminished recall 2/3.  Animal naming test 13. Cranial Nerves: Fundoscopic exam reveals sharp disc margins. Pupils equal, briskly reactive to light. Extraocular movements full without nystagmus. Visual fields full to confrontation. Hearing intact. Facial sensation intact. Face, tongue, palate moves normally and symmetrically.  Motor: Normal bulk and tone. Normal strength in all tested extremity muscles. Sensory.: intact to touch , pinprick , position and vibratory sensation.  Coordination: Rapid alternating movements normal in all extremities. Finger-to-nose and heel-to-shin performed accurately bilaterally. Gait and Station: Arises from chair without difficulty. Stance is normal. Gait demonstrates normal stride length and balance . Able to heel, toe and tandem walk without difficulty.  Reflexes: 1+ and symmetric. Toes downgoing.      ASSESSMENT: 68 year male with recurrent episodes of brief loss of consciousness as well as brief episodes of aura possibly complex partial seizures with and without secondary generalization.  Remote history of subarachnoid hemorrhage status post surgical aneurysm clipping  In 1991and seizures may be symptomatic late effect  from this.     PLAN: I had a long discussion  with the patient regarding his symptoms of possible auto and partial complex seizures which appear to have responded to low-dose Keppra.  Continue Keppra XR in the current dose of 500 mg daily as patient is reluctant to increase the dose.  He was asked to avoid seizure provoking stimuli like sleep deprivation, irregular eating and sleeping habits and stimulants.  He will return for follow-up in 6 months with my nurse practitioner Janett Billow or call earlier if necessary. . Greater than 50% time during this 25-minute   visit was spent on counseling and coordination of care about seizures and cerebral aneurysm and answering questions  Antony Contras, MD  [facility] [address] [place], [place] [address]  Phone [phone] Fax [phone] Note: This document was prepared with digital dictation and possible smart phrase technology. Any transcriptional errors that result from this process are unintentional.

## 2018-02-25 ENCOUNTER — Encounter (HOSPITAL_BASED_OUTPATIENT_CLINIC_OR_DEPARTMENT_OTHER): Payer: Medicare Other

## 2018-04-03 ENCOUNTER — Other Ambulatory Visit: Payer: Self-pay | Admitting: Neurology

## 2018-07-08 DIAGNOSIS — D1801 Hemangioma of skin and subcutaneous tissue: Secondary | ICD-10-CM | POA: Diagnosis not present

## 2018-07-08 DIAGNOSIS — D225 Melanocytic nevi of trunk: Secondary | ICD-10-CM | POA: Diagnosis not present

## 2018-07-08 DIAGNOSIS — L570 Actinic keratosis: Secondary | ICD-10-CM | POA: Diagnosis not present

## 2018-07-08 DIAGNOSIS — Z85828 Personal history of other malignant neoplasm of skin: Secondary | ICD-10-CM | POA: Diagnosis not present

## 2018-07-08 DIAGNOSIS — D2262 Melanocytic nevi of left upper limb, including shoulder: Secondary | ICD-10-CM | POA: Diagnosis not present

## 2018-07-08 DIAGNOSIS — L821 Other seborrheic keratosis: Secondary | ICD-10-CM | POA: Diagnosis not present

## 2018-07-08 DIAGNOSIS — D2261 Melanocytic nevi of right upper limb, including shoulder: Secondary | ICD-10-CM | POA: Diagnosis not present

## 2018-07-12 ENCOUNTER — Other Ambulatory Visit: Payer: Self-pay | Admitting: Cardiovascular Disease

## 2018-08-19 ENCOUNTER — Ambulatory Visit (INDEPENDENT_AMBULATORY_CARE_PROVIDER_SITE_OTHER): Payer: Medicare Other | Admitting: Adult Health

## 2018-08-19 ENCOUNTER — Encounter: Payer: Self-pay | Admitting: Adult Health

## 2018-08-19 ENCOUNTER — Other Ambulatory Visit: Payer: Self-pay

## 2018-08-19 VITALS — BP 127/72 | HR 52 | Temp 97.7°F | Ht 72.0 in | Wt 195.2 lb

## 2018-08-19 DIAGNOSIS — G40209 Localization-related (focal) (partial) symptomatic epilepsy and epileptic syndromes with complex partial seizures, not intractable, without status epilepticus: Secondary | ICD-10-CM | POA: Diagnosis not present

## 2018-08-19 MED ORDER — LEVETIRACETAM ER 500 MG PO TB24: 1000.0000 mg | ORAL_TABLET | Freq: Every day | ORAL | 3 refills | Status: DC

## 2018-08-19 NOTE — Patient Instructions (Signed)
Your Plan:  Continue keppra XR 1000mg  daily (take 2 capsules at night). If you continues to experience dizziness/balance sensation after 1 week or symptoms worsen, please notify office.   Follow up in 4 months     Thank you for coming to see [place] at [facility]. I hope we have been able to provide you high quality care today.  You may receive a patient satisfaction survey over the next few weeks. We would appreciate your feedback and comments so that we may continue to improve ourselves and the health of our patients.

## 2018-08-19 NOTE — Progress Notes (Addendum)
[facility] [address] [place]. [place] [address] [phone]       OFFICE FOLLOW UP NOTE  James. James Myers Date of Birth:  22-Mar-1950 Medical Record Number:  [id number]   Referring MD: Shelva Majestic Reason for Referral:  Passing out episodes  HPI: Initial visit 12/02/2017 ;James Myers is a pleasant 68 year old Caucasian male who has had multiple episodes of brief loss of consciousness since April 2017.  History is obtained from the patient and review of electronic medical records.  I personally reviewed imaging films.  He actually states that the few months prior to his first episode of passing out he used to have brief episodes of particular smell or taste and he would manage to control it and prevent it from getting worse.  The first episode he passed out while he was brushing teeth he had a feeling that something were to happen before he could control it he fell down got up quickly he was out for only few seconds.  Second episode occurred when he was outside all of a sudden his wife who was standing behind him noticed that he was leaning to one side and started snorting when his wife began to wake him up he started jerking his arms and then woke up and was quite disoriented for a few minutes.  The third episode he fell in the kitchen floor on the hardwood floor and woke up in a minute.  He is felt he did not have a warning.  The wife also reports other episodes like a staring episode she weakness in the gym about 4 weeks ago when the patient was staring and was briefly unresponsive and not talking within 30 seconds he started speaking.  Patient has no prior history of known seizures significant head injury with loss of consciousness or intracranial issues except that he had a history of ruptured subarachnoid hemorrhage in 1991 for which he underwent craniotomy and surgical clipping in [place].  He apparently did quite well from that and had no significant physical deficits or  cognitive issues.  He has been seen by cardiology who have ordered an echocardiogram which was unremarkable.  Carotid ultrasound done on 11/06/2017 showed no significant extracranial stenosis.  CT scan of the head on 10/28/2017 showed old left craniotomy defect and aneurysm clip on the left side anteriorly.  No acute abnormality.  Patient has not had an EEG.  He has not followed up with a neurologist.   It is unclear if he can have an MRI because his aneurysm clip was in 1991 and I am not sure it is MRI compatible  Update 02/11/2018 ; he returns for follow-up after last visit 2 months ago.  He states he has had no further episodes of aura or passing out or seizure-like episodes.  He is still on Keppra XR 500 mg daily and states that he felt tired and hence did not feel like he needed to increase the dose further.  He complains of occasional positional dizziness when he bends down.  He is otherwise doing well without any new symptoms.  He had EEG done on 12/31/2017 which I personally reviewed and was normal.  He had CT angiogram of the brain done on 12/11/2017 which also have reviewed shows no new aneurysms.  The previous surgical the clipped aneurysm is noted in the left cavernous distal carotid.  Patient has no new complaints today.  08/19/18 visit: James Myers is a 68 year old male who is being seen today for follow-up  regarding seizures.  He reports approximately 6 times daily experiences sensation of nausea, increased elevation, balance difficulties and fatigue which lasts for approximately 1 minute.  Denies loss of consciousness, dizziness, SOB or palpitations.  Unable to associate activity with the symptoms as he can be sitting still while he experiences these.  Prior seizure episodes consist of auras and passing out but he feels as though his most recent symptoms are different from his possible seizure-like episodes.  These have been present for approximately 1 month.  He does endorse increasing Keppra XR dose  as recommended at prior visit from 500 mg daily to 500 mg twice daily approximately 1 month ago.  He denies any recurrent seizure-like episodes.  Blood pressure stable 127/72.  Heart rate 52.  He does not check vitals during or after these episodes.     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[employer] resource strain: Not on file   Food insecurity    Worry: Not on file    Inability: Not on Lexicographer needs    Medical: Not on file    Non-medical: Not on file  Tobacco Use   Smoking status: Never Smoker   Smokeless tobacco: Never Used  Substance and Sexual Activity   Alcohol use: Yes    Comment: 4 oz daily   Drug use: No   Sexual activity: Yes  Lifestyle   Physical activity    Days per week: Not on file    Minutes per session: Not on file   Stress: Not on file  Relationships   Social connections    Talks on phone: Not on file    Gets together: Not on file    Attends religious service: Not on file    Active member of club or organization: Not on file    Attends meetings of clubs or organizations: Not on file    Relationship status: Not on file   Intimate partner violence    Fear of current or ex partner: Not on file    Emotionally abused: Not on file    Physically abused: Not on file    Forced sexual activity: Not on file  Other Topics Concern   Not on file  Social History Narrative   Born and raised in  [place]. Currently resided in a private residence with his wife. Denies pets.    Fun: Golf and likes to swim / yard-work   Denies religious beliefs effecting healthcare.   One Child    Two grandchildren        Medications:   Current Outpatient Medications on File Prior to Visit  Medication Sig Dispense Refill   Calcium Carbonate Antacid (TUMS PO) Take by mouth.     levETIRAcetam (KEPPRA XR) 500 MG 24 hr tablet TAKE 1 TABLET BY MOUTH EVERY DAY  FOR 7 DAYS THEN INCREASE TO 2 TABLETS DAILY THEREAFTER 180 tablet 1   Multiple Vitamins-Iron (MULTIVITAMINS WITH IRON) TABS tablet Take 1 tablet by mouth daily.     rosuvastatin (CRESTOR) 10 MG tablet TAKE ONE TABLET BY MOUTH DAILY 30 tablet 6   naproxen (NAPROSYN) 500 MG tablet Take 1 tablet (500 mg total) by mouth 2 (two) times daily with a meal. (Patient not taking: Reported on 02/11/2018) 60 tablet 0   Current Facility-Administered Medications on File Prior to Visit  Medication Dose Route Frequency Provider Last Rate Last Dose   levETIRAcetam (KEPPRA XR) 24 hr tablet 1,000 mg  1,000 mg Oral Daily Garvin Fila, MD        Allergies:  No Known Allergies  Physical Exam General: well developed, well nourished middle-aged Caucasian male, seated, in no evident distress Head: head normocephalic and atraumatic.   Neck: supple with no carotid or supraclavicular bruits Cardiovascular: regular rate and rhythm, no murmurs Musculoskeletal: no deformity. Old craniotomy scar left frontal Skin:  no rash/petichiae Vascular:  Normal pulses all extremities  Neurologic Exam Mental Status: Awake and fully alert. Oriented to place and time. Recent and remote memory intact. Attention span, concentration and fund of knowledge appropriate. Mood and affect appropriate.  Cranial Nerves: Pupils equal, briskly reactive to light. Extraocular movements full without nystagmus. Visual fields full to confrontation. Hearing intact. Facial sensation intact. Face,  tongue, palate moves normally and symmetrically.  Motor: Normal bulk and tone. Normal strength in all tested extremity muscles. Sensory.: intact to touch , pinprick , position and vibratory sensation.  Coordination: Rapid alternating movements normal in all extremities. Finger-to-nose and heel-to-shin performed accurately bilaterally. Gait and Station: Arises from chair without difficulty. Stance is normal. Gait demonstrates normal stride length and balance . Able to heel, toe and tandem walk without difficulty.  Reflexes: 1+ and symmetric. Toes downgoing.      ASSESSMENT: 68 year male who is being seen today for follow-up regarding likely seizure-like episodes consisting of brief loss of consciousness as well as brief episodes of aura possibly complex partial seizures with and without secondary generalization.  Remote history of subarachnoid hemorrhage status post surgical aneurysm clipping  In 1991and seizures may be symptomatic late effect  from this.  Since increasing Keppra XR dose approximately 1 month ago, he has been experiencing nausea, increased salivation, balance difficulties and fatigue lasting approximately 1 minute occurring approximately 6 times daily.  He denies additional episodes of prior seizure-like activity.  Recent episodes possibly medication side effect     PLAN: -Recommend taking Keppra XR 2 tablets of 500 mg nightly for total of 1000mg . He will call office after 1 week if he continues to experience symptoms and may consider change in therapy or he will call earlier with any worsening symptoms.  Also advised him to monitor blood pressure during these new episodes to ensure not cardiac related as they are different from his strokelike episodes which he was previously evaluated by cardiology for -avoid seizure provoking stimuli like sleep deprivation, irregular eating and sleeping habits and stimulants.    Follow-up in 4 months or call earlier if needed   Greater than 50%  time during this 25-minute  visit was spent on counseling and coordination of care about seizures and recent onset of new episodes answering all questions to patient satisfaction.   Venancio Poisson, AGNP-BC  [facility] [address] [place], [place] [address]  Phone [phone] Fax [phone] Note: This document was prepared with  digital dictation and possible smart Company secretary. Any transcriptional errors that result from this process are unintentional.

## 2018-08-20 ENCOUNTER — Telehealth: Payer: Self-pay | Admitting: Adult Health

## 2018-08-20 NOTE — Progress Notes (Signed)
I agree with the above plan 

## 2018-08-20 NOTE — Telephone Encounter (Signed)
Pt has called to inform that he is at the pharmacy.  He states during the appointment he was told by NP Janett Billow that she wanted him on fast release Keppra. While at the pharmacy he is being told the script they have is for slow release.  Please call pt

## 2018-08-20 NOTE — Telephone Encounter (Signed)
Spoke to pt.  We discussed that JV/NP wanted to have him take 2 tab of 500mg  XR keppra for about a week and see if symptoms of dizziness/ balance improve and if not will proceed with other option.  Pt verbalized understanding.

## 2018-08-29 ENCOUNTER — Encounter: Payer: Self-pay | Admitting: Adult Health

## 2018-09-09 ENCOUNTER — Ambulatory Visit (INDEPENDENT_AMBULATORY_CARE_PROVIDER_SITE_OTHER): Payer: Medicare Other | Admitting: Adult Health

## 2018-09-09 ENCOUNTER — Encounter: Payer: Self-pay | Admitting: Adult Health

## 2018-09-09 ENCOUNTER — Other Ambulatory Visit: Payer: Self-pay

## 2018-09-09 VITALS — BP 104/68 | Temp 98.1°F | Wt 190.0 lb

## 2018-09-09 DIAGNOSIS — N401 Enlarged prostate with lower urinary tract symptoms: Secondary | ICD-10-CM

## 2018-09-09 DIAGNOSIS — R351 Nocturia: Secondary | ICD-10-CM | POA: Diagnosis not present

## 2018-09-09 LAB — POCT URINALYSIS DIPSTICK
Bilirubin, UA: NEGATIVE
Blood, UA: NEGATIVE
Glucose, UA: NEGATIVE
Ketones, UA: NEGATIVE
Leukocytes, UA: NEGATIVE
Nitrite, UA: NEGATIVE
Odor: NEGATIVE
Protein, UA: POSITIVE — AB
Spec Grav, UA: >=1.03 — AB (ref 1.010–1.025)
Urobilinogen, UA: 0.2 E.U./dL
pH, UA: 5.5 (ref 5.0–8.0)

## 2018-09-09 LAB — PSA: PSA: 0.41 ng/mL (ref 0.10–4.00)

## 2018-09-09 NOTE — Progress Notes (Signed)
Subjective:    Patient ID: James Myers, male    DOB: [birth year], 68 y.o.   MRN: [id number]  HPI  68 year old male who  has a past medical history of Arthritis, Brain aneurysm, Chicken pox, GERD (gastroesophageal reflux disease), Melanoma ([place]), and Stroke ([place]).  He presents to the office today for an acute issue. He reports that over the last year he has had urinary dribbling. Over the last two months he has been experiencing nocturia, incomplete bladder emptying, decreased stream, and urinary urgency.   He denies painful defecation, fevers, or chills.   Review of Systems See HPI   Past Medical History:  Diagnosis Date  . Arthritis   . Brain aneurysm   . Chicken pox   . GERD (gastroesophageal reflux disease)   . Melanoma ([place])   . Stroke [facility])     Social History   Socioeconomic History  . Marital status: Married    Spouse name: Not on file  . Number of children: 1  . Years of education: 73  . Highest education level: Not on file  Occupational History  . Occupation: Retired     Comment: Geophysicist/field seismologist for [employer]  . Financial resource strain: Not on file  . Food insecurity    Worry: Not on file    Inability: Not on file  . Transportation needs    Medical: Not on file    Non-medical: Not on file  Tobacco Use  . Smoking status: Never Smoker  . Smokeless tobacco: Never Used  Substance and Sexual Activity  . Alcohol use: Yes    Comment: 4 oz daily  . Drug use: No  . Sexual activity: Yes  Lifestyle  . Physical activity    Days per week: Not on file    Minutes per session: Not on file  . Stress: Not on file  Relationships  . Social Herbalist on phone: Not on file    Gets together: Not on file    Attends religious service: Not on file    Active member of club or organization: Not on file    Attends meetings of clubs or organizations: Not on file    Relationship status: Not on file  . Intimate partner violence    Fear of current or ex  partner: Not on file    Emotionally abused: Not on file    Physically abused: Not on file    Forced sexual activity: Not on file  Other Topics Concern  . Not on file  Social History Narrative   Born and raised in [place]. Currently resided in a private residence with his wife. Denies pets.    Fun: Golf and likes to swim / yard-work   Denies religious beliefs effecting healthcare.   One Child    Two grandchildren        Past Surgical History:  Procedure Laterality Date  . BRAIN SURGERY  1991  . TONSILLECTOMY AND ADENOIDECTOMY      Family History  Problem Relation Age of Onset  . Aneurysm Mother        brain   . Arthritis Mother   . Parkinson's disease Mother   . Stroke Father   . Arthritis Father   . Syncope episode Father   . Arthritis Maternal Grandmother   . Diabetes Maternal Grandfather   . Arthritis Paternal Grandmother   . Tremor Brother     No Known Allergies  Current Outpatient Medications on  File Prior to Visit  Medication Sig Dispense Refill  . Calcium Carbonate Antacid (TUMS PO) Take by mouth.    . levETIRAcetam (KEPPRA) 1000 MG tablet Take 2,000 mg by mouth at bedtime.    . Multiple Vitamins-Iron (MULTIVITAMINS WITH IRON) TABS tablet Take 1 tablet by mouth daily.    . rosuvastatin (CRESTOR) 10 MG tablet TAKE ONE TABLET BY MOUTH DAILY 30 tablet 6   Current Facility-Administered Medications on File Prior to Visit  Medication Dose Route Frequency Provider Last Rate Last Dose  . levETIRAcetam (KEPPRA XR) 24 hr tablet 1,000 mg  1,000 mg Oral Daily Sethi, Pramod S, MD        BP 104/68   Temp 98.1 F (36.7 C)   Wt 190 lb (86.2 kg)   BMI 25.77 kg/m       Objective:   Physical Exam Vitals signs reviewed.  Constitutional:      Appearance: Normal appearance.  Genitourinary:    Prostate: Enlarged. Not tender and no nodules present.     Rectum: Guaiac result negative.  Skin:    General: Skin is warm and dry.     Capillary Refill: Capillary refill  takes less than 2 seconds.  Neurological:     General: No focal deficit present.     Mental Status: He is alert and oriented to person, place, and time.  Psychiatric:        Mood and Affect: Mood normal.        Behavior: Behavior normal.        Thought Content: Thought content normal.        Judgment: Judgment normal.        Assessment & Plan:  1. Benign prostatic hyperplasia with nocturia - Consider flomax or Proscar - If everything is ok, he would like to do watchful waiting - PSA - POC Urinalysis Dipstick  Dorothyann Peng, NP

## 2018-09-18 ENCOUNTER — Encounter: Payer: Self-pay | Admitting: Adult Health

## 2018-09-18 ENCOUNTER — Other Ambulatory Visit: Payer: Self-pay | Admitting: Adult Health

## 2018-09-18 MED ORDER — LAMOTRIGINE 25 MG PO TABS: 25.0000 mg | ORAL_TABLET | Freq: Every day | ORAL | 0 refills | Status: DC

## 2018-09-18 NOTE — Addendum Note (Signed)
Addended by: Mal Misty on: 09/18/2018 05:18 PM   Modules accepted: Orders

## 2018-09-18 NOTE — Progress Notes (Deleted)
la 

## 2018-09-23 ENCOUNTER — Other Ambulatory Visit: Payer: Self-pay | Admitting: Adult Health

## 2018-09-23 MED ORDER — LEVETIRACETAM 500 MG PO TABS: 500.0000 mg | ORAL_TABLET | Freq: Every day | ORAL | 3 refills | Status: DC

## 2018-09-23 NOTE — Progress Notes (Signed)
Due to potential Keppra side effects, recommended decreasing Keppra dosage from 1000 mg nightly to 500 mg nightly.  He will continue on Lamictal 25 mg daily for an additional week with possible increase at that time.

## 2018-09-29 ENCOUNTER — Encounter: Payer: Self-pay | Admitting: Adult Health

## 2018-09-30 ENCOUNTER — Encounter: Payer: Self-pay | Admitting: Adult Health

## 2018-09-30 ENCOUNTER — Other Ambulatory Visit: Payer: Self-pay | Admitting: Adult Health

## 2018-09-30 MED ORDER — LACOSAMIDE 50 MG PO TABS: 50.0000 mg | ORAL_TABLET | Freq: Two times a day (BID) | ORAL | 3 refills | Status: DC

## 2018-10-02 ENCOUNTER — Telehealth: Payer: Self-pay | Admitting: Adult Health

## 2018-10-02 NOTE — Telephone Encounter (Signed)
The patient has been sending MyChart messages back and fourth to his neurologist about his seizure medication and havening diarrhea. He was wondering if Tommi Rumps can see his messages to respond to them.   Please advise

## 2018-10-02 NOTE — Telephone Encounter (Signed)
Called and spoke to the pt.  He has been having 5 loose stools per day.  He denies abdominal pain, nausea and vomiting.  Does have discomfort before the movement.  No blood seen.  Has not noticed any abdominal tenderness.  Has not introduced any new food or started a new diet.  Also denies fever and chills.  Goes through his day in a normal fashion.  Has been treating with Pepto Bismol.  Advised pt that he will have a virtual visit with [facility] on 10/03/2018 but he should be prepared to come to the lab if Tommi Rumps thinks blood work or a sample is warranted.  Pt has agreed to this.  Nothing further needed.

## 2018-10-03 ENCOUNTER — Other Ambulatory Visit: Payer: Self-pay

## 2018-10-03 ENCOUNTER — Telehealth (INDEPENDENT_AMBULATORY_CARE_PROVIDER_SITE_OTHER): Payer: Medicare Other | Admitting: Adult Health

## 2018-10-03 ENCOUNTER — Other Ambulatory Visit: Payer: Medicare Other

## 2018-10-03 DIAGNOSIS — R197 Diarrhea, unspecified: Secondary | ICD-10-CM

## 2018-10-03 NOTE — Addendum Note (Signed)
Addended by: Raliegh Ip on: 10/03/2018 03:22 PM   Modules accepted: Orders

## 2018-10-03 NOTE — Progress Notes (Signed)
Virtual Visit via Video Note  I connected with James Myers on 10/03/18 at  2:30 PM EDT by a video enabled telemedicine application and verified that I am speaking with the correct person using two identifiers.  Location patient: home Location provider:work or home office Persons participating in the virtual visit: patient, provider  I discussed the limitations of evaluation and management by telemedicine and the availability of in person appointments. The patient expressed understanding and agreed to proceed.   HPI: 68 year old male who is being evaluated today for diarrhea.    His seizure medications were switched 09/18/2018 from [place] to Lamictal due to side effects versus partial seizure symptoms.  Started on Lamictal 25 mg nightly and was continued on Keppra to taper him down.  Since starting this medication he has been experiencing diarrhea, he reports watery stool and has not had a formed bowel movement since August 20.  He denies nausea, vomiting, diarrhea, blood in stool, fevers, chills.  He has not had any changes in his diet.  He also reports no abdominal pain but does have a slight discomfort right before he is ready to have a bowel movement.  His first episode of diarrhea usually is at about 5:15 in the morning at which time he is awoken out of sleep and they continue throughout the day.  He may have about 5 bowel movements a day.  On 09/30/2018 he was switched from Lamictal to Vimpat due to potential side effect of diarrhea from Lamictal.  He was continued on Keppra at this time.  Was advised by neurology that if he continued to have diarrhea then to follow-up with his PCP.  He reports that since starting the new regimen of Vimpat and Keppra he continues to have diarrhea.  He has been using Pepto since these issues started and notices some mild improvement for a few hours.  He has not traveled nor has he been prescribed any antibiotics recently   ROS: See pertinent positives and  negatives per HPI.  Past Medical History:  Diagnosis Date  . Arthritis   . Brain aneurysm   . Chicken pox   . GERD (gastroesophageal reflux disease)   . Melanoma ([place])   . Stroke [facility])     Past Surgical History:  Procedure Laterality Date  . BRAIN SURGERY  1991  . TONSILLECTOMY AND ADENOIDECTOMY      Family History  Problem Relation Age of Onset  . Aneurysm Mother        brain   . Arthritis Mother   . Parkinson's disease Mother   . Stroke Father   . Arthritis Father   . Syncope episode Father   . Arthritis Maternal Grandmother   . Diabetes Maternal Grandfather   . Arthritis Paternal Grandmother   . Tremor Brother      Current Outpatient Medications:  .  Calcium Carbonate Antacid (TUMS PO), Take by mouth., Disp: , Rfl:  .  lacosamide (VIMPAT) 50 MG TABS tablet, Take 1 tablet (50 mg total) by mouth 2 (two) times daily., Disp: 60 tablet, Rfl: 3 .  levETIRAcetam (KEPPRA) 500 MG tablet, Take 1 tablet (500 mg total) by mouth at bedtime., Disp: 30 tablet, Rfl: 3 .  Multiple Vitamins-Iron (MULTIVITAMINS WITH IRON) TABS tablet, Take 1 tablet by mouth daily., Disp: , Rfl:  .  rosuvastatin (CRESTOR) 10 MG tablet, TAKE ONE TABLET BY MOUTH DAILY, Disp: 30 tablet, Rfl: 6  EXAM:  VITALS per patient if applicable:  GENERAL: alert, oriented, appears well and  in no acute distress  HEENT: atraumatic, conjunttiva clear, no obvious abnormalities on inspection of external nose and ears  NECK: normal movements of the head and neck  LUNGS: on inspection no signs of respiratory distress, breathing rate appears normal, no obvious gross SOB, gasping or wheezing  CV: no obvious cyanosis  MS: moves all visible extremities without noticeable abnormality  PSYCH/NEURO: pleasant and cooperative, no obvious depression or anxiety, speech and thought processing grossly intact  ASSESSMENT AND PLAN:  Discussed the following assessment and plan:  1. Diarrhea, unspecified type -We will check  CBC and BMP as well as a stool culture although I expect this to be negative. -Was advised over the weekend to use one half tab Imodium every 6 hours until he has a formed bowel movement.  Also encouraged brat diet over the weekend -We will follow-up with him early next week to see how he is doing - CBC with Differential/Platelet; Future - Basic Metabolic Panel; Future - Stool culture; Future     I discussed the assessment and treatment plan with the patient. The patient was provided an opportunity to ask questions and all were answered. The patient agreed with the plan and demonstrated an understanding of the instructions.   The patient was advised to call back or seek an in-person evaluation if the symptoms worsen or if the condition fails to improve as anticipated.   Dorothyann Peng, NP

## 2018-10-04 LAB — CBC WITH DIFFERENTIAL/PLATELET
Absolute Monocytes: 663 cells/uL (ref 200–950)
Basophils Absolute: 37 cells/uL (ref 0–200)
Basophils Relative: 0.6 %
Eosinophils Absolute: 99 cells/uL (ref 15–500)
Eosinophils Relative: 1.6 %
HCT: 43.5 % (ref 38.5–50.0)
Hemoglobin: 14.7 g/dL (ref 13.2–17.1)
Lymphs Abs: 1327 cells/uL (ref 850–3900)
MCH: 31.7 pg (ref 27.0–33.0)
MCHC: 33.8 g/dL (ref 32.0–36.0)
MCV: 94 fL (ref 80.0–100.0)
MPV: 9.8 fL (ref 7.5–12.5)
Monocytes Relative: 10.7 %
Neutro Abs: 4073 cells/uL (ref 1500–7800)
Neutrophils Relative %: 65.7 %
Platelets: 247 10*3/uL (ref 140–400)
RBC: 4.63 10*6/uL (ref 4.20–5.80)
RDW: 12.9 % (ref 11.0–15.0)
Total Lymphocyte: 21.4 %
WBC: 6.2 10*3/uL (ref 3.8–10.8)

## 2018-10-04 LAB — BASIC METABOLIC PANEL
BUN: 21 mg/dL (ref 7–25)
CO2: 27 mmol/L (ref 20–32)
Calcium: 9.3 mg/dL (ref 8.6–10.3)
Chloride: 104 mmol/L (ref 98–110)
Creat: 1.11 mg/dL (ref 0.70–1.25)
Glucose, Bld: 93 mg/dL (ref 65–99)
Potassium: 4.1 mmol/L (ref 3.5–5.3)
Sodium: 140 mmol/L (ref 135–146)

## 2018-10-07 ENCOUNTER — Encounter: Payer: Self-pay | Admitting: Adult Health

## 2018-10-07 DIAGNOSIS — R197 Diarrhea, unspecified: Secondary | ICD-10-CM | POA: Diagnosis not present

## 2018-10-07 NOTE — Addendum Note (Signed)
Addended by: Elmer Picker on: 10/07/2018 01:19 PM   Modules accepted: Orders

## 2018-10-08 ENCOUNTER — Other Ambulatory Visit: Payer: Self-pay | Admitting: Adult Health

## 2018-10-08 MED ORDER — LEVETIRACETAM 250 MG PO TABS: 250.0000 mg | ORAL_TABLET | Freq: Every day | ORAL | 2 refills | Status: DC

## 2018-10-08 MED ORDER — LEVETIRACETAM 250 MG PO TABS: ORAL_TABLET | ORAL | 2 refills | Status: DC

## 2018-10-08 NOTE — Addendum Note (Signed)
Addended by: Mal Misty on: 10/08/2018 03:42 PM   Modules accepted: Orders

## 2018-10-11 LAB — STOOL CULTURE
MICRO NUMBER:: 856822
MICRO NUMBER:: 856823
MICRO NUMBER:: 856824
SHIGA RESULT:: NOT DETECTED
SPECIMEN QUALITY:: ADEQUATE
SPECIMEN QUALITY:: ADEQUATE
SPECIMEN QUALITY:: ADEQUATE

## 2018-10-13 ENCOUNTER — Encounter: Payer: Self-pay | Admitting: Adult Health

## 2018-10-14 ENCOUNTER — Encounter: Payer: Self-pay | Admitting: Adult Health

## 2018-10-15 ENCOUNTER — Other Ambulatory Visit: Payer: Self-pay | Admitting: Adult Health

## 2018-10-18 ENCOUNTER — Encounter: Payer: Self-pay | Admitting: Adult Health

## 2018-10-20 ENCOUNTER — Other Ambulatory Visit: Payer: Self-pay | Admitting: Adult Health

## 2018-10-20 MED ORDER — LACOSAMIDE 50 MG PO TABS: 100.0000 mg | ORAL_TABLET | Freq: Two times a day (BID) | ORAL | 3 refills | Status: DC

## 2018-10-20 NOTE — Progress Notes (Signed)
Due to potential side effects of Keppra, Lamictal initially started but started to experience diarrhea therefore discontinue Lamictal and started on Vimpat.  Vimpat slowly titrated up and Keppra slowly taper down.  At this time, he will increase Vimpat to 100mg  twice daily and can discontinue Keppra  250 mg daily at this time.  After 1 week, if tolerating well, will increase Vimpat further to 150 mg twice daily for possible maintenance dosage.

## 2018-11-03 ENCOUNTER — Encounter: Payer: Self-pay | Admitting: Adult Health

## 2018-11-06 ENCOUNTER — Other Ambulatory Visit: Payer: Self-pay

## 2018-11-06 DIAGNOSIS — Z20822 Contact with and (suspected) exposure to covid-19: Secondary | ICD-10-CM

## 2018-11-06 DIAGNOSIS — Z20828 Contact with and (suspected) exposure to other viral communicable diseases: Secondary | ICD-10-CM | POA: Diagnosis not present

## 2018-11-07 LAB — NOVEL CORONAVIRUS, NAA: SARS-CoV-2, NAA: NOT DETECTED

## 2018-12-24 ENCOUNTER — Other Ambulatory Visit: Payer: Self-pay

## 2018-12-31 ENCOUNTER — Other Ambulatory Visit: Payer: Self-pay

## 2018-12-31 ENCOUNTER — Encounter: Payer: Self-pay | Admitting: Adult Health

## 2018-12-31 ENCOUNTER — Ambulatory Visit (INDEPENDENT_AMBULATORY_CARE_PROVIDER_SITE_OTHER): Payer: Medicare Other | Admitting: Adult Health

## 2018-12-31 VITALS — BP 118/64 | HR 61 | Temp 97.2°F | Ht 72.0 in | Wt 185.6 lb

## 2018-12-31 DIAGNOSIS — G40209 Localization-related (focal) (partial) symptomatic epilepsy and epileptic syndromes with complex partial seizures, not intractable, without status epilepticus: Secondary | ICD-10-CM

## 2018-12-31 MED ORDER — LACOSAMIDE 100 MG PO TABS: 100.0000 mg | ORAL_TABLET | Freq: Two times a day (BID) | ORAL | 5 refills | Status: DC

## 2018-12-31 NOTE — Progress Notes (Signed)
I agree with the above plan 

## 2018-12-31 NOTE — Progress Notes (Signed)
[facility] [address] [place]. [place] [address] [phone]       OFFICE FOLLOW UP NOTE  James. James Myers Date of Birth:  [birth year] Medical Record Number:  [place]:[id number]   Referring MD: Shelva Majestic Reason for Referral:  Passing out episodes  HPI: Initial visit 12/02/2017 ;James Myers is a pleasant 68 year old Caucasian male who has had multiple episodes of brief loss of consciousness since April 2017.  History is obtained from the patient and review of electronic medical records.  I personally reviewed imaging films.  He actually states that the few months prior to his first episode of passing out he used to have brief episodes of particular smell or taste and he would manage to control it and prevent it from getting worse.  The first episode he passed out while he was brushing teeth he had a feeling that something were to happen before he could control it he fell down got up quickly he was out for only few seconds.  Second episode occurred when he was outside all of a sudden his wife who was standing behind him noticed that he was leaning to one side and started snorting when his wife began to wake him up he started jerking his arms and then woke up and was quite disoriented for a few minutes.  The third episode he fell in the kitchen floor on the hardwood floor and woke up in a minute.  He is felt he did not have a warning.  The wife also reports other episodes like a staring episode she weakness in the gym about 4 weeks ago when the patient was staring and was briefly unresponsive and not talking within 30 seconds he started speaking.  Patient has no prior history of known seizures significant head injury with loss of consciousness or intracranial issues except that he had a history of ruptured subarachnoid hemorrhage in 1991 for which he underwent craniotomy and surgical clipping in [place].  He apparently did quite well from that and had no significant physical deficits or  cognitive issues.  He has been seen by cardiology who have ordered an echocardiogram which was unremarkable.  Carotid ultrasound done on 11/06/2017 showed no significant extracranial stenosis.  CT scan of the head on 10/28/2017 showed old left craniotomy defect and aneurysm clip on the left side anteriorly.  No acute abnormality.  Patient has not had an EEG.  He has not followed up with a neurologist.   It is unclear if he can have an MRI because his aneurysm clip was in 1991 and I am not sure it is MRI compatible  Update 02/11/2018 ; he returns for follow-up after last visit 2 months ago.  He states he has had no further episodes of aura or passing out or seizure-like episodes.  He is still on Keppra XR 500 mg daily and states that he felt tired and hence did not feel like he needed to increase the dose further.  He complains of occasional positional dizziness when he bends down.  He is otherwise doing well without any new symptoms.  He had EEG done on 12/31/2017 which I personally reviewed and was normal.  He had CT angiogram of the brain done on 12/11/2017 which also have reviewed shows no new aneurysms.  The previous surgical the clipped aneurysm is noted in the left cavernous distal carotid.  Patient has no new complaints today.  Update 08/19/2018: James Myers is a 68 year old male who is being seen today for follow-up  regarding seizures.  He reports approximately 6 times daily experiences sensation of nausea, increased elevation, balance difficulties and fatigue which lasts for approximately 1 minute.  Denies loss of consciousness, dizziness, SOB or palpitations.  Unable to associate activity with the symptoms as he can be sitting still while he experiences these.  Prior seizure episodes consist of auras and passing out but he feels as though his most recent symptoms are different from his possible seizure-like episodes.  These have been present for approximately 1 month.  He does endorse increasing Keppra XR  dose as recommended at prior visit from 500 mg daily to 500 mg twice daily approximately 1 month ago.  He denies any recurrent seizure-like episodes.  Blood pressure stable 127/72.  Heart rate 52.  He does not check vitals during or after these episodes.  Update 12/31/2018: James Myers is a 68 year old male who is being seen today for seizure follow-up.  Due to possible continued seizure activity, Keppra dosage increased after prior visit.  Initially doing well with increasing dose but then started experiencing more frequent episodes as described above therefore recommend transitioning to Lamictal.  He unfortunately experienced frequent episodes of diarrhea therefore transitioned to Vimpat.  He is currently on 100 mg twice daily which he has been tolerating well without any side effects or reoccurring seizure or seizure type activity.  He does report yesterday having a 30 second episode of vertigo but has not experienced vertigo symptoms since and denies any recent vertigo prior to yesterday.  He did check blood pressure at that time and was elevated for him at 133/25.  Blood pressure at today's visit 118/64.  He does report recent weight loss with daily exercise and eating healthy.  No further concerns at this time.    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[employer] resource strain: Not on file   Food insecurity    Worry: Not on file    Inability: Not on Energy manager needs    Medical: Not on file    Non-medical: Not on file  Tobacco Use   Smoking status: Never Smoker   Smokeless tobacco: Never Used  Substance and Sexual Activity   Alcohol use: Yes    Comment: 4 oz daily   Drug use: No   Sexual activity: Yes  Lifestyle   Physical activity    Days per week: Not on file    Minutes per session: Not on file   Stress: Not on file  Relationships   Social connections    Talks on phone: Not on file    Gets together: Not on file    Attends religious service: Not on file    Active member of club or organization: Not on file    Attends meetings of clubs or organizations: Not on file  Relationship status: Not on file   Intimate partner violence    Fear of current or ex partner: Not on file    Emotionally abused: Not on file    Physically abused: Not on file    Forced sexual activity: Not on file  Other Topics Concern   Not on file  Social History Narrative   Born and raised in [place]. Currently resided in a private residence with his wife. Denies pets.    Fun: Golf and likes to swim / yard-work   Denies religious beliefs effecting healthcare.   One Child    Two grandchildren        Medications:   Current Outpatient Medications on File Prior to Visit  Medication Sig Dispense Refill   Calcium Carbonate Antacid (TUMS PO) Take by mouth.     lacosamide (VIMPAT) 50 MG TABS tablet Take 2 tablets (100 mg total) by mouth 2 (two) times daily. 120 tablet 3   Multiple Vitamins-Iron (MULTIVITAMINS WITH IRON) TABS tablet Take 1 tablet by mouth daily.     rosuvastatin (CRESTOR) 10 MG tablet TAKE ONE TABLET BY MOUTH DAILY 30 tablet 6   No current facility-administered medications on file prior to visit.     Allergies:  No Known Allergies  Physical Exam  Today's Vitals   12/31/18 0844  BP: 118/64  Pulse: 61  Temp: (!) 97.2 F (36.2 C)  Weight: 185 lb 9.6 oz (84.2 kg)  Height: 6' (1.829 m)   Body mass index is  25.17 kg/m.  General: well developed, well nourished middle-aged Caucasian male, seated, in no evident distress Head: head normocephalic and atraumatic.   Neck: supple with no carotid or supraclavicular bruits Cardiovascular: regular rate and rhythm, no murmurs Musculoskeletal: no deformity. Old craniotomy scar left frontal Skin:  no rash/petichiae Vascular:  Normal pulses all extremities  Neurologic Exam Mental Status: Awake and fully alert. Oriented to place and time. Recent and remote memory intact. Attention span, concentration and fund of knowledge appropriate. Mood and affect appropriate.  Cranial Nerves: Pupils equal, briskly reactive to light. Extraocular movements full without nystagmus. Visual fields full to confrontation. Hearing intact. Facial sensation intact. Face, tongue, palate moves normally and symmetrically.  Motor: Normal bulk and tone. Normal strength in all tested extremity muscles. Sensory.: intact to touch , pinprick , position and vibratory sensation.  Coordination: Rapid alternating movements normal in all extremities. Finger-to-nose and heel-to-shin performed accurately bilaterally. Gait and Station: Arises from chair without difficulty. Stance is normal. Gait demonstrates normal stride length and balance . Able to heel, toe and tandem walk without difficulty.  Reflexes: 1+ and symmetric. Toes downgoing.      ASSESSMENT: 68 year male who is being seen today for follow-up regarding likely seizure-like episodes consisting of brief loss of consciousness as well as brief episodes of aura possibly complex partial seizures with and without secondary generalization.  Remote history of subarachnoid hemorrhage status post surgical aneurysm clipping  In 1991and seizures may be symptomatic late effect  from this.  Possible worsening of focal seizures/symptoms with increasing Keppra dosage therefore transition to Lamictal but unfortunately experienced side effects therefore  transitioned to Vimpat.  He has been stable on Vimpat 100 mg twice daily.  He did have one episode of vertigo yesterday lasting 30 seconds but no other reported seizure symptoms or activity.      PLAN: -Continuation of Vimpat 100 mg twice daily for seizure prevention.  Refill provided -Advised to monitor vertigo type symptoms and if he continues  to experience frequent episodes or any additional seizure episodes or symptoms to notify office -Advised to continue to monitor blood pressure at home and ongoing follow-up with PCP for HTN and HLD management -avoid seizure provoking stimuli like sleep deprivation, irregular eating and sleeping habits and stimulants.    Follow-up in 6 months or call earlier if needed   Greater than 50% time during this 25-minute  visit was spent on counseling and coordination of care about seizures and discussion regarding transitioning to Vimpat and recent vertigo episode and answering all questions to patient satisfaction.   Frann Rider, AGNP-BC  [facility] [address] [place], [place] [address]  Phone [phone] Fax [phone] Note: This document was prepared with digital dictation and possible smart phrase technology. Any transcriptional errors that result from this process are unintentional.

## 2018-12-31 NOTE — Patient Instructions (Signed)
Your Plan:  Continue Vimpat 100 mg twice daily  Continue to monitor vertigo symptoms and please keep me updated with any reoccurring vertigo type symptoms or any worsening    Follow-up in 6 months or call earlier if needed     Thank you for coming to see [place] at [facility]. I hope we have been able to provide you high quality care today.  You may receive a patient satisfaction survey over the next few weeks. We would appreciate your feedback and comments so that we may continue to improve ourselves and the health of our patients.

## 2019-01-13 DIAGNOSIS — I788 Other diseases of capillaries: Secondary | ICD-10-CM | POA: Diagnosis not present

## 2019-01-13 DIAGNOSIS — D1801 Hemangioma of skin and subcutaneous tissue: Secondary | ICD-10-CM | POA: Diagnosis not present

## 2019-01-13 DIAGNOSIS — L821 Other seborrheic keratosis: Secondary | ICD-10-CM | POA: Diagnosis not present

## 2019-01-13 DIAGNOSIS — L858 Other specified epidermal thickening: Secondary | ICD-10-CM | POA: Diagnosis not present

## 2019-01-13 DIAGNOSIS — Z85828 Personal history of other malignant neoplasm of skin: Secondary | ICD-10-CM | POA: Diagnosis not present

## 2019-01-13 DIAGNOSIS — L570 Actinic keratosis: Secondary | ICD-10-CM | POA: Diagnosis not present

## 2019-01-13 DIAGNOSIS — L812 Freckles: Secondary | ICD-10-CM | POA: Diagnosis not present

## 2019-01-13 DIAGNOSIS — L578 Other skin changes due to chronic exposure to nonionizing radiation: Secondary | ICD-10-CM | POA: Diagnosis not present

## 2019-02-09 ENCOUNTER — Other Ambulatory Visit: Payer: Self-pay | Admitting: Cardiovascular Disease

## 2019-02-18 ENCOUNTER — Ambulatory Visit: Payer: Medicare Other | Attending: Adult Health

## 2019-02-18 DIAGNOSIS — Z23 Encounter for immunization: Secondary | ICD-10-CM | POA: Diagnosis not present

## 2019-02-18 NOTE — Progress Notes (Signed)
   Covid-19 Vaccination Clinic  Name:  James Myers    MRN: [place]:[id number] DOB: February 07, 1950  02/18/2019  [name] was observed post Covid-19 immunization for 15 minutes without incidence. He was provided with Vaccine Information Sheet and instruction to access the V-Safe system.   [name] was instructed to call 911 with any severe reactions post vaccine: Marland Kitchen Difficulty breathing  . Swelling of your face and throat  . A fast heartbeat  . A bad rash all over your body  . Dizziness and weakness    Immunizations Administered    Name Date Dose VIS Date Route   Pfizer COVID-19 Vaccine 02/18/2019 12:54 PM 0.3 mL 01/09/2019 Intramuscular   Manufacturer: [place]   Lot: [id number]   [place]: [id number]

## 2019-02-24 NOTE — Progress Notes (Signed)
Cardiology Office Note:    Date:  02/26/2019   ID:  Lawrene Bowcutt, DOB [birth year], MRN [id number]  PCP:  Dorothyann Peng, NP  Cardiologist:  Shelva Majestic, MD   Referring MD: Dorothyann Peng, NP   Chief Complaint  Patient presents with   Follow-up  hyperlipidemia   History of Present Illness:    Harlo Hammers is a 69 y.o. male with a hx of syncope, brain aneurysm, and stroke. He follows with neurology for syncope and is taking keppra 1000 mg daily. He is felt to have complex partial seizures and symptomatic epilepsy from his remote subarachnoid hemorrhage. He has a history of left frontotemporal craniotomy with clipping of the left parasellar aneurysm (1991). Carotid ultrasound 11/06/17 with less than 50% stenosis in ICAs bilaterally. CT head did not show acute intracranial abnormalities. He was referred to cardiology evaluation. Echo with normal EF and no wall motion abnormality, no valvular disease. Heart monitor showed predominately sinus rhythm, sinus bradycardia during sleep and sinus tachycardia during exercise. One NSVT 9-beat run, asymptomatic. He was last seen by Dr. Claiborne Billings on 01/16/18 and was doing well at that time, but suspected OSA. Pt deferred sleep evaluation at that time. Dr. Claiborne Billings also started 10 mg crestor.   He returns for annual follow up. He brings a copy of his CTA head from neurology which shows a clip lateral to the left cavernous carotid.  Unfortunately, he has "stable encephalomalacia in the left inferior lobe related to prior surgery."  He has also been changed from keppra to Vimpat 100 mg tablet BID. No seizure or syncopal episodes. He is using voltaren gel for arthritis. He denies CP, SOB, DOE, orthorpnea, and lower extremity swelling. He does snore. He does not want a sleep study right now. He has completely stopped drinking and this seems to have helped his seizures.  His pressure at home is near 120/60. First pressure here today was elevated 152/76, but was  130/78 on  recheck.  Overall he is doing well from a cardiac perspective with no complaints.   Past Medical History:  Diagnosis Date   Arthritis    Brain aneurysm    Chicken pox    GERD (gastroesophageal reflux disease)    Melanoma (HCC)    Stroke [facility])     Past Surgical History:  Procedure Laterality Date   BRAIN SURGERY  1991   TONSILLECTOMY AND ADENOIDECTOMY      Current Medications: Current Meds  Medication Sig   Calcium Carbonate Antacid (TUMS PO) Take by mouth.   diclofenac Sodium (VOLTAREN) 1 % GEL Apply topically 4 (four) times daily as needed.   Lacosamide (VIMPAT) 100 MG TABS Take 100 mg by mouth 2 (two) times daily.   Multiple Vitamins-Iron (MULTIVITAMINS WITH IRON) TABS tablet Take 1 tablet by mouth daily.   rosuvastatin (CRESTOR) 10 MG tablet TAKE ONE TABLET BY MOUTH DAILY     Allergies:   Patient has no known allergies.   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 Currently resided in a private residence with his wife. Denies pets.    Fun: Golf and likes to swim / yard-work   Denies religious beliefs effecting healthcare.   One Child    Two grandchildren       Social Determinants of Radio broadcast assistant Strain:    Difficulty of Paying Living Expenses: Not on file  Food Insecurity:    Worried About Charity fundraiser in the Last Year: Not on file   [employer] of Food in the Last Year: Not on file  Transportation Needs:    Lack  of Transportation (Medical): Not on file   Lack of Transportation (Non-Medical): Not on file  Physical Activity:    Days of Exercise per Week: Not on file   Minutes of Exercise per Session: Not on file  Stress:    Feeling of Stress : Not on file  Social Connections:    Frequency of Communication with Friends and Family: Not on file   Frequency of Social Gatherings with Friends and Family: Not on file   Attends Religious Services: Not on file   Active Member of Clubs or Organizations: Not on file   Attends Archivist Meetings: Not on file   Marital Status: Not on file     Family History: The patient's family history includes Aneurysm in his mother; Arthritis in his father, maternal grandmother, mother, and paternal grandmother; Diabetes in his maternal grandfather; Parkinson's disease in his mother; Stroke in his father; Syncope episode in his father; Tremor in his brother.  ROS:   Please see the history of present illness.    All other systems reviewed and are negative.  EKGs/Labs/Other Studies Reviewed:    The following studies were reviewed today:  Holter monitor 12/03/17: The patient was monitored from November 5 through January 01, 2018.  The predominant rhythm was sinus rhythm with an average rate at 68 bpm.  The slowest heart rate was sinus bradycardia at 6:40 AM while the patient was sleeping.  The fastest heart rate was sinus tachycardia at 129 bpm which occurred while the patient was exercising.  There was one episode of asymptomatic 9 beat nonsustained ventricular tachycardia at a rate of 123.  There were no episodes of atrial fibrillation.  There were no significant pauses.  There were no episodes of SVT.  EKG:  EKG is  ordered today.  The ekg ordered today demonstrates sinus bradycardia HR 54 bpm  Recent Labs: 10/03/2018: BUN 21; Creat 1.11; Hemoglobin 14.7; Platelets 247; Potassium 4.1; Sodium 140  Recent Lipid Panel    Component Value Date/Time    CHOL 187 07/22/2017 0947   TRIG 99.0 07/22/2017 0947   HDL 56.50 07/22/2017 0947   CHOLHDL 3 07/22/2017 0947   VLDL 19.8 07/22/2017 0947   LDLCALC 110 (H) 07/22/2017 0947    Physical Exam:    VS:  BP (!) 152/76    Pulse (!) 54    Ht 6' (1.829 m)    Wt 189 lb 3.2 oz (85.8 kg)    BMI 25.66 kg/m     Wt Readings from Last 3 Encounters:  02/26/19 189 lb 3.2 oz (85.8 kg)  12/31/18 185 lb 9.6 oz (84.2 kg)  09/09/18 190 lb (86.2 kg)     GEN:  Well nourished, well developed in no acute distress HEENT: Normal NECK:  No JVD; No carotid bruits LYMPHATICS: No lymphadenopathy CARDIAC: regular rhythm, bradycardic rate, no murmurs, rubs, gallops RESPIRATORY:  Clear to auscultation without rales, wheezing or rhonchi  ABDOMEN: Soft, non-tender, non-distended MUSCULOSKELETAL:  No edema; No deformity  SKIN: Warm and dry NEUROLOGIC:  Alert and oriented x 3 PSYCHIATRIC:  Normal affect   ASSESSMENT:    1. Hyperlipidemia, unspecified hyperlipidemia type   2. Essential hypertension   3. Suspected sleep apnea   4. Alcohol use    PLAN:    In order of problems listed above:  Hyperlipidemia - 10 mg crestor - will collect fasting lipid panel and LFTs   Suspected sleep apnea Snoring - needs sleep study   Alcohol use - has completely stopped drinking   Hypertension - was elevated initially, but normal on recheck was 130/78   Follow up in 1 year, sooner if needed.    Medication Adjustments/Labs and Tests Ordered: Current medicines are reviewed at length with the patient today.  Concerns regarding medicines are outlined above.  Orders Placed This Encounter  Procedures   Lipid panel   Hepatic function panel   EKG 12-Lead   No orders of the defined types were placed in this encounter.   Signed, Ledora Bottcher, [place]  02/26/2019 9:36 AM    [facility]

## 2019-02-25 ENCOUNTER — Telehealth: Payer: Self-pay | Admitting: Physician Assistant

## 2019-02-25 NOTE — Telephone Encounter (Signed)
Spoke to pt and informed that in the event the office has a delayed opening, Angie would like for him to come into the office at 11:00 AM if he is able to do that. If we do not have a delay, pt can come at normal time. Informed pt I will call him in the morning to let him know.  Pt verbalized thanks and was agreeable to come in at 11 if the office delays opening.

## 2019-02-26 ENCOUNTER — Ambulatory Visit (INDEPENDENT_AMBULATORY_CARE_PROVIDER_SITE_OTHER): Payer: Medicare Other | Admitting: Physician Assistant

## 2019-02-26 ENCOUNTER — Other Ambulatory Visit: Payer: Self-pay

## 2019-02-26 ENCOUNTER — Encounter: Payer: Self-pay | Admitting: Physician Assistant

## 2019-02-26 VITALS — BP 152/76 | HR 54 | Ht 72.0 in | Wt 189.2 lb

## 2019-02-26 DIAGNOSIS — Z7289 Other problems related to lifestyle: Secondary | ICD-10-CM | POA: Diagnosis not present

## 2019-02-26 DIAGNOSIS — I10 Essential (primary) hypertension: Secondary | ICD-10-CM | POA: Diagnosis not present

## 2019-02-26 DIAGNOSIS — Z789 Other specified health status: Secondary | ICD-10-CM

## 2019-02-26 DIAGNOSIS — E785 Hyperlipidemia, unspecified: Secondary | ICD-10-CM

## 2019-02-26 DIAGNOSIS — F1090 Alcohol use, unspecified, uncomplicated: Secondary | ICD-10-CM

## 2019-02-26 DIAGNOSIS — R29818 Other symptoms and signs involving the nervous system: Secondary | ICD-10-CM | POA: Diagnosis not present

## 2019-02-26 NOTE — Patient Instructions (Signed)
Medication Instructions:  Your physician recommends that you continue on your current medications as directed. Please refer to the Current Medication list given to you today.  *If you need a refill on your cardiac medications before your next appointment, please call your pharmacy*  Lab Work: Your physician recommends that you return for a FASTING lipid profile and hepatic function panel at your earliest convenience.  If you have labs (blood work) drawn today and your tests are completely normal, you will receive your results only by: Marland Kitchen MyChart Message (if you have MyChart) OR . A paper copy in the mail If you have any lab test that is abnormal or we need to change your treatment, we will call you to review the results.   Follow-Up: At [facility], you and your health needs are our priority.  As part of our continuing mission to provide you with exceptional heart care, we have created designated Provider Care Teams.  These Care Teams include your primary Cardiologist (physician) and Advanced Practice Providers (APPs -  Physician Assistants and Nurse Practitioners) who all work together to provide you with the care you need, when you need it.  Your next appointment:   12 month(s)  The format for your next appointment:   In Person  Provider:   You may see Shelva Majestic, MD or one of the following Advanced Practice Providers on your designated Care Team:    Almyra Deforest, PA-C  Fabian Sharp, PA-C or   Roby Lofts, [place]   Other Instructions Please call two months ahead of time to schedule your 1 year follow-up appointment.

## 2019-03-03 DIAGNOSIS — I10 Essential (primary) hypertension: Secondary | ICD-10-CM | POA: Diagnosis not present

## 2019-03-03 DIAGNOSIS — E785 Hyperlipidemia, unspecified: Secondary | ICD-10-CM | POA: Diagnosis not present

## 2019-03-03 LAB — LIPID PANEL
Chol/HDL Ratio: 2.7 ratio (ref 0.0–5.0)
Cholesterol, Total: 135 mg/dL (ref 100–199)
HDL: 50 mg/dL (ref >39)
LDL Chol Calc (NIH): 71 mg/dL (ref 0–99)
Triglycerides: 68 mg/dL (ref 0–149)
VLDL Cholesterol Cal: 14 mg/dL (ref 5–40)

## 2019-03-03 LAB — HEPATIC FUNCTION PANEL
ALT: 39 IU/L (ref 0–44)
AST: 29 IU/L (ref 0–40)
Albumin: 4.3 g/dL (ref 3.8–4.8)
Alkaline Phosphatase: 57 IU/L (ref 39–117)
Bilirubin Total: 0.5 mg/dL (ref 0.0–1.2)
Bilirubin, Direct: 0.14 mg/dL (ref 0.00–0.40)
Total Protein: 6.3 g/dL (ref 6.0–8.5)

## 2019-03-11 ENCOUNTER — Ambulatory Visit: Payer: Medicare Other | Attending: Internal Medicine

## 2019-03-11 DIAGNOSIS — Z23 Encounter for immunization: Secondary | ICD-10-CM | POA: Insufficient documentation

## 2019-03-11 NOTE — Progress Notes (Signed)
   Covid-19 Vaccination Clinic  Name:  Elman Mccomiskey    MRN: [place]:[id number] DOB: December 16, 1950  03/11/2019  Mr. Haller was observed post Covid-19 immunization for 15 minutes without incidence. He was provided with Vaccine Information Sheet and instruction to access the V-Safe system.   Mr. Sams was instructed to call 911 with any severe reactions post vaccine: Marland Kitchen Difficulty breathing  . Swelling of your face and throat  . A fast heartbeat  . A bad rash all over your body  . Dizziness and weakness    Immunizations Administered    Name Date Dose VIS Date Route   Pfizer COVID-19 Vaccine 03/11/2019  8:26 AM 0.3 mL 01/09/2019 Intramuscular   Manufacturer: [place]   Lot: [id number]   [place]: [id number]

## 2019-03-23 ENCOUNTER — Other Ambulatory Visit: Payer: Self-pay | Admitting: Cardiovascular Disease

## 2019-07-06 ENCOUNTER — Encounter: Payer: Self-pay | Admitting: Adult Health

## 2019-07-07 ENCOUNTER — Encounter: Payer: Self-pay | Admitting: Adult Health

## 2019-07-07 ENCOUNTER — Ambulatory Visit (INDEPENDENT_AMBULATORY_CARE_PROVIDER_SITE_OTHER): Payer: Medicare Other | Admitting: Adult Health

## 2019-07-07 VITALS — BP 135/74 | HR 50 | Ht 72.0 in | Wt 193.0 lb

## 2019-07-07 DIAGNOSIS — G40209 Localization-related (focal) (partial) symptomatic epilepsy and epileptic syndromes with complex partial seizures, not intractable, without status epilepticus: Secondary | ICD-10-CM

## 2019-07-07 MED ORDER — VIMPAT 100 MG PO TABS: 100.0000 mg | ORAL_TABLET | Freq: Two times a day (BID) | ORAL | 1 refills | Status: DC

## 2019-07-07 NOTE — Progress Notes (Signed)
[facility] [address] [place]. [place] [address] [phone]       OFFICE FOLLOW UP NOTE  [name] Date of Birth:  December 14, 1950 Medical Record Number:  [id number]   Referring MD: Shelva Majestic Reason for Referral:  Passing out episodes  HPI: Initial visit 12/02/2017 ;[name] is a pleasant 69 year old Caucasian male who has had multiple episodes of brief loss of consciousness since April 2017.  History is obtained from the patient and review of electronic medical records.  I personally reviewed imaging films.  He actually states that the few months prior to his first episode of passing out he used to have brief episodes of particular smell or taste and he would manage to control it and prevent it from getting worse.  The first episode he passed out while he was brushing teeth he had a feeling that something were to happen before he could control it he fell down got up quickly he was out for only few seconds.  Second episode occurred when he was outside all of a sudden his wife who was standing behind him noticed that he was leaning to one side and started snorting when his wife began to wake him up he started jerking his arms and then woke up and was quite disoriented for a few minutes.  The third episode he fell in the kitchen floor on the hardwood floor and woke up in a minute.  He is felt he did not have a warning.  The wife also reports other episodes like a staring episode she weakness in the gym about 4 weeks ago when the patient was staring and was briefly unresponsive and not talking within 30 seconds he started speaking.  Patient has no prior history of known seizures significant head injury with loss of consciousness or intracranial issues except that he had a history of ruptured subarachnoid hemorrhage in 1991 for which he underwent craniotomy and surgical clipping in [place].  He apparently did quite well from that and had no significant physical deficits or  cognitive issues.  He has been seen by cardiology who have ordered an echocardiogram which was unremarkable.  Carotid ultrasound done on 11/06/2017 showed no significant extracranial stenosis.  CT scan of the head on 10/28/2017 showed old left craniotomy defect and aneurysm clip on the left side anteriorly.  No acute abnormality.  Patient has not had an EEG.  He has not followed up with a neurologist.   It is unclear if he can have an MRI because his aneurysm clip was in 1991 and I am not sure it is MRI compatible  Update 02/11/2018 ; he returns for follow-up after last visit 2 months ago.  He states he has had no further episodes of aura or passing out or seizure-like episodes.  He is still on Keppra XR 500 mg daily and states that he felt tired and hence did not feel like he needed to increase the dose further.  He complains of occasional positional dizziness when he bends down.  He is otherwise doing well without any new symptoms.  He had EEG done on 12/31/2017 which I personally reviewed and was normal.  He had CT angiogram of the brain done on 12/11/2017 which also have reviewed shows no new aneurysms.  The previous surgical the clipped aneurysm is noted in the left cavernous distal carotid.  Patient has no new complaints today.  Update 08/19/2018: [name] is a 69 year old male who is being seen today for follow-up  regarding seizures.  He reports approximately 6 times daily experiences sensation of nausea, increased elevation, balance difficulties and fatigue which lasts for approximately 1 minute.  Denies loss of consciousness, dizziness, SOB or palpitations.  Unable to associate activity with the symptoms as he can be sitting still while he experiences these.  Prior seizure episodes consist of auras and passing out but he feels as though his most recent symptoms are different from his possible seizure-like episodes.  These have been present for approximately 1 month.  He does endorse increasing Keppra XR  dose as recommended at prior visit from 500 mg daily to 500 mg twice daily approximately 1 month ago.  He denies any recurrent seizure-like episodes.  Blood pressure stable 127/72.  Heart rate 52.  He does not check vitals during or after these episodes.  Update 12/31/2018: [name] is a 69 year old male who is being seen today for seizure follow-up.  Due to possible continued seizure activity, Keppra dosage increased after prior visit.  Initially doing well with increasing dose but then started experiencing more frequent episodes as described above therefore recommend transitioning to Lamictal.  He unfortunately experienced frequent episodes of diarrhea therefore transitioned to Vimpat.  He is currently on 100 mg twice daily which he has been tolerating well without any side effects or reoccurring seizure or seizure type activity.  He does report yesterday having a 30 second episode of vertigo but has not experienced vertigo symptoms since and denies any recent vertigo prior to yesterday.  He did check blood pressure at that time and was elevated for him at 133/25.  Blood pressure at today's visit 118/64.  He does report recent weight loss with daily exercise and eating healthy.  No further concerns at this time.  Update 07/07/2019: Returns for seizure follow-up.  He has been doing well since prior visit 6 months ago without recurrent seizure activity remains on Vimpat 100 mg twice daily tolerating well. He does report cessation of all alcohol.  Blood pressure today 135/74.  He questions safety of arthritic pain medication such as naproxen in regards to seizures and use of Vimpat and possible interaction. He does use voltaren gel and aspercream as needed at night only. No other concerns at this time.       ROS:   14 system review of systems is positive for seizures, joint pain and all other systems negative  PMH:  Past Medical History:  Diagnosis Date  . Arthritis   . Brain aneurysm   . Chicken pox    . GERD (gastroesophageal reflux disease)   . Melanoma ([place])   . Stroke [facility])     Social History:  Social History   Socioeconomic History  . Marital status: Married    Spouse name: Not on file  . Number of children: 1  . Years of education: 55  . Highest education level: Not on file  Occupational History  . 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 Smoking status: Never Smoker  . Smokeless tobacco: Never Used  Substance and Sexual Activity  . Alcohol use: Yes    Comment: 4 oz daily  . Drug use: No  . Sexual activity: Yes  Other Topics Concern  . Not on file  Social History Narrative   Born and raised in [place]. Currently resided in a private residence with his wife. Denies pets.    Fun: Golf and likes to swim / yard-work   Denies religious  beliefs effecting healthcare.   One Child    Two grandchildren       Social Determinants of Radio broadcast assistant Strain:   . Difficulty of Paying Living Expenses:   Food Insecurity:   . Worried About Charity fundraiser in the Last Year:   . Arboriculturist in the Last Year:   Transportation Needs:   . Film/video editor (Medical):   Marland Kitchen Lack of Transportation (Non-Medical):   Physical Activity:   . Days of Exercise per Week:   . Minutes of Exercise per Session:   Stress:   . Feeling of Stress :   Social Connections:   . Frequency of Communication with Friends and Family:   . Frequency of Social Gatherings with Friends and Family:   . Attends Religious Services:   . Active Member of Clubs or Organizations:   . Attends Archivist Meetings:   Marland Kitchen Marital Status:   Intimate Partner Violence:   . Fear of Current or Ex-Partner:   . Emotionally Abused:   Marland Kitchen Physically Abused:   . Sexually Abused:     Medications:   Current Outpatient Medications on File Prior to Visit  Medication Sig Dispense Refill  . diclofenac Sodium (VOLTAREN) 1 % GEL Apply topically 4 (four) times  daily as needed.    . Multiple Vitamins-Iron (MULTIVITAMINS WITH IRON) TABS tablet Take 1 tablet by mouth daily.    . rosuvastatin (CRESTOR) 10 MG tablet TAKE ONE TABLET BY MOUTH DAILY 30 tablet 5   No current facility-administered medications on file prior to visit.    Allergies:  No Known Allergies  Physical Exam  Today's Vitals   07/07/19 0826  BP: 135/74  Pulse: (!) 50  Weight: 193 lb (87.5 kg)  Height: 6' (1.829 m)   Body mass index is 26.18 kg/m.  General: well developed, well nourished middle-aged Caucasian male, seated, in no evident distress Head: head normocephalic and atraumatic.   Neck: supple with no carotid or supraclavicular bruits Cardiovascular: regular rate and rhythm, no murmurs Musculoskeletal: no deformity. Old craniotomy scar left frontal Skin:  no rash/petichiae Vascular:  Normal pulses all extremities  Neurologic Exam Mental Status: Awake and fully alert. Oriented to place and time. Recent and remote memory intact. Attention span, concentration and fund of knowledge appropriate. Mood and affect appropriate.  Cranial Nerves: Pupils equal, briskly reactive to light. Extraocular movements full without nystagmus. Visual fields full to confrontation. Hearing intact. Facial sensation intact. Face, tongue, palate moves normally and symmetrically.  Motor: Normal bulk and tone. Normal strength in all tested extremity muscles. Sensory.: intact to touch , pinprick , position and vibratory sensation.  Coordination: Rapid alternating movements normal in all extremities. Finger-to-nose and heel-to-shin performed accurately bilaterally. Gait and Station: Arises from chair without difficulty. Stance is normal. Gait demonstrates normal stride length and balance . Able to heel, toe and tandem walk without difficulty.  Reflexes: 1+ and symmetric. Toes downgoing.      ASSESSMENT: 69 year male who is being seen today for follow-up regarding likely seizure-like episodes  consisting of brief loss of consciousness as well as brief episodes of aura possibly complex partial seizures with and without secondary generalization.  Remote history of subarachnoid hemorrhage status post surgical aneurysm clipping  In 1991and seizures may be symptomatic late effect  from this.  Initially placed on Keppra with complaints of worsening symptoms and unable to tolerate Lamictal therefore currently on Vimpat tolerating well without recurrent seizure activity.  PLAN: -Continuation of Vimpat 100 mg twice daily for seizure prevention.  Refill provided -Advised use of NSAIDs such as naproxen can increase risk of GI bleed as well as stroke and heart attack.  Would not recommend use on a long-term basis as these risks are increased.  Would recommend use of Tylenol as needed and topical pain creams as well as consideration of aquatic therapy visits may be beneficial.  Advised to continue to follow with PCP for ongoing monitoring and management -Advised to continue to monitor blood pressure at home and ongoing follow-up with PCP for HTN and HLD management    Follow-up in 6 months or call earlier if needed   I spent 23 minutes of face-to-face and non-face-to-face time with patient.  This included previsit chart review, lab review, study review, order entry, electronic health record documentation, patient education   Frann Rider, [facility]  [facility] [address] [place], [place] [address]  Phone [phone] Fax [phone] Note: This document was prepared with digital dictation and possible smart phrase technology. Any transcriptional errors that result from this process are unintentional.

## 2019-07-07 NOTE — Patient Instructions (Signed)
Continue vimpat 100mg  twice daily for seizure prevention - refill provided  Continue to follow up with PCP regarding cholesterol and blood pressure management   Continue to monitor blood pressure at home  Maintain strict control of hypertension with blood pressure goal below 130/90, diabetes with hemoglobin A1c goal below 6.5% and cholesterol with LDL cholesterol (bad cholesterol) goal below 70 mg/dL. I also advised the patient to eat a healthy diet with plenty of whole grains, cereals, fruits and vegetables, exercise regularly and maintain ideal body weight.  Followup in the future with me in 6 months or call earlier if needed       Thank you for coming to see [place] at [facility]. I hope we have been able to provide you high quality care today.  You may receive a patient satisfaction survey over the next few weeks. We would appreciate your feedback and comments so that we may continue to improve ourselves and the health of our patients.

## 2019-07-10 IMAGING — US US CAROTID DUPLEX BILAT
1 series · 13 of 24 positions shown · non-contrast
Comparison: None.

CLINICAL DATA: Syncope and left facial aching. History of brain
aneurysm.

EXAM:
BILATERAL CAROTID DUPLEX ULTRASOUND
TECHNIQUE: Gray scale imaging, color Doppler and duplex ultrasound were
performed of bilateral carotid and vertebral arteries in the neck.

[Series 1: us carotid duplex bilat · 0.06mm/px · 13 of 65 slices shown]
[im 1/65]
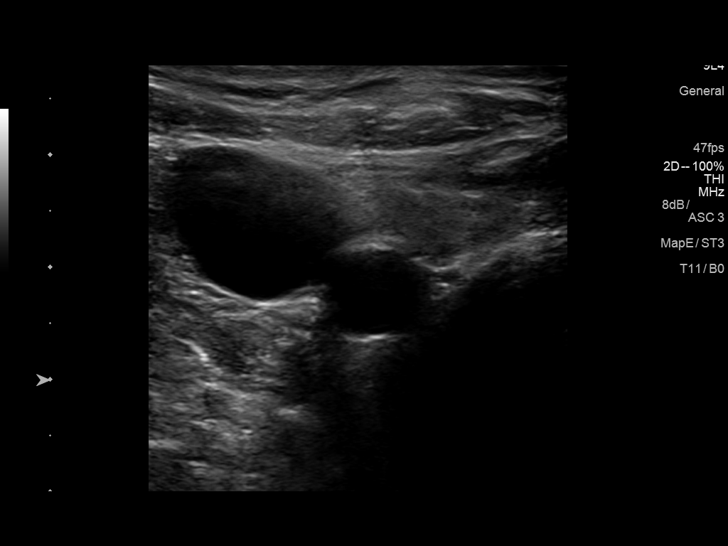
[im 6/65]
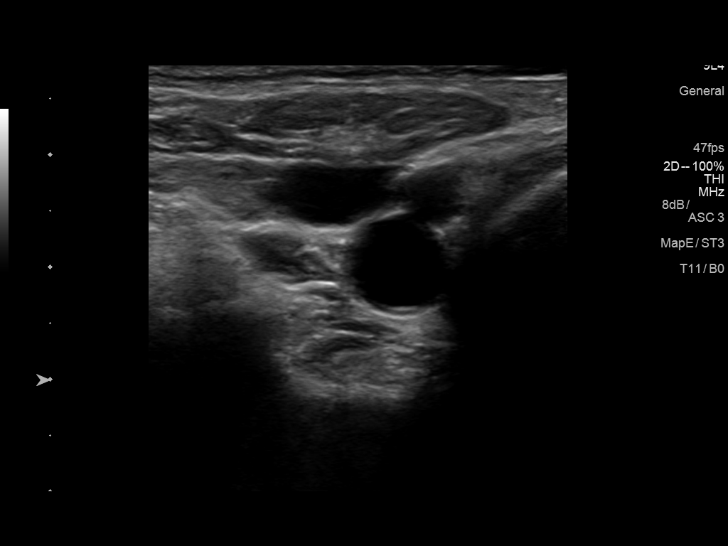
[im 12/65]
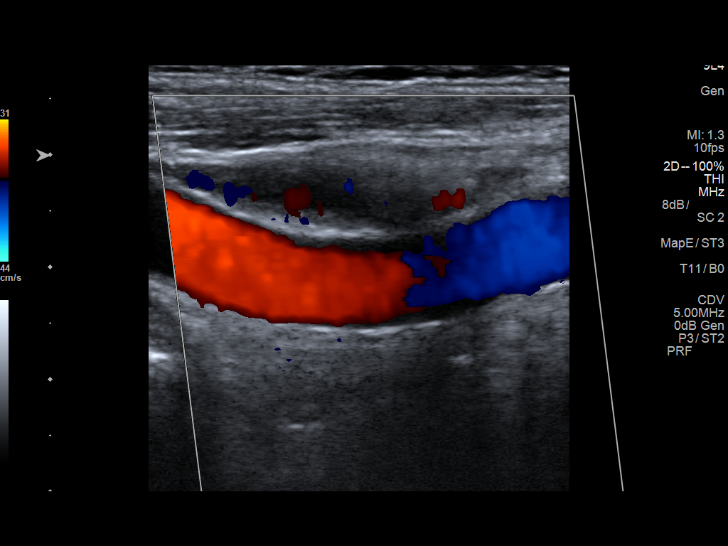
[im 17/65]
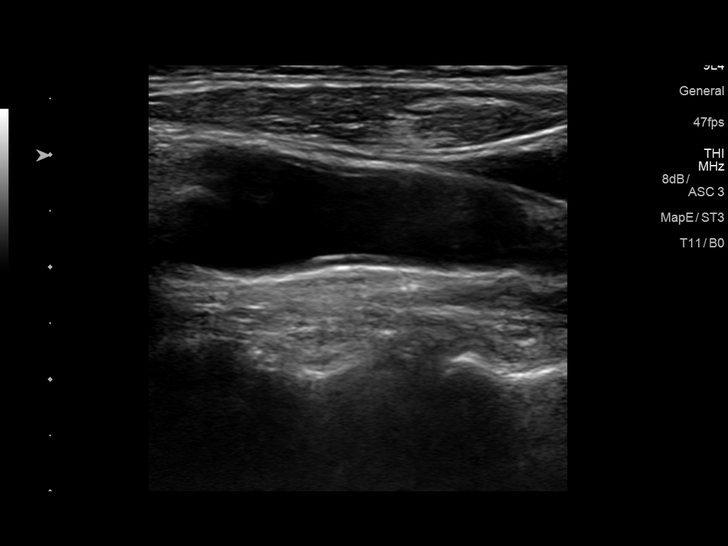
[im 23/65]
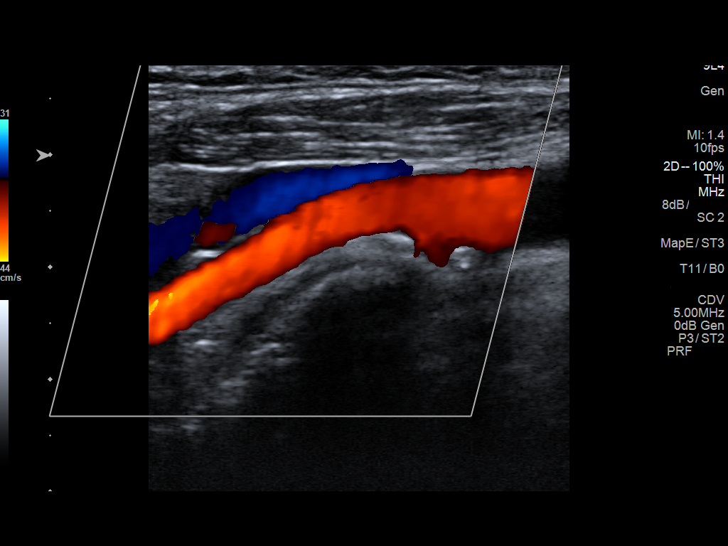
[im 28/65]
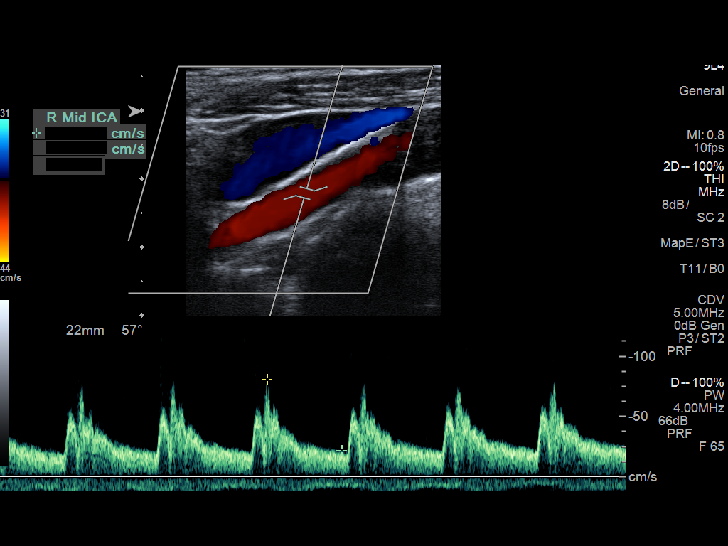
[im 34/65]
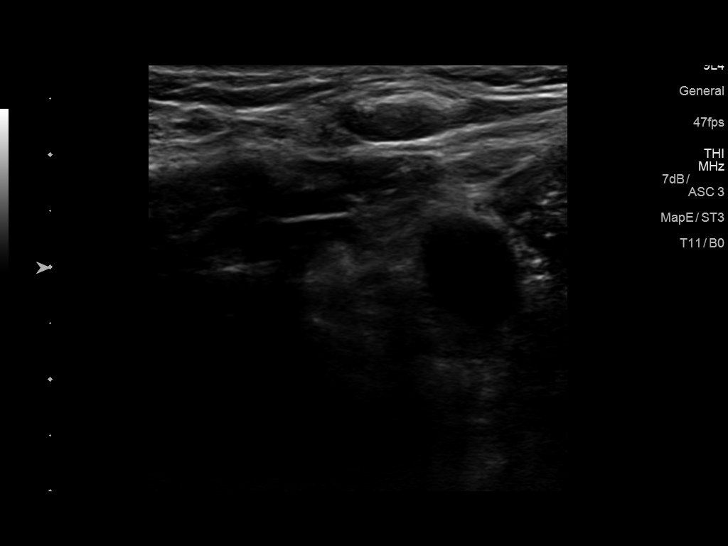
[im 37/65]
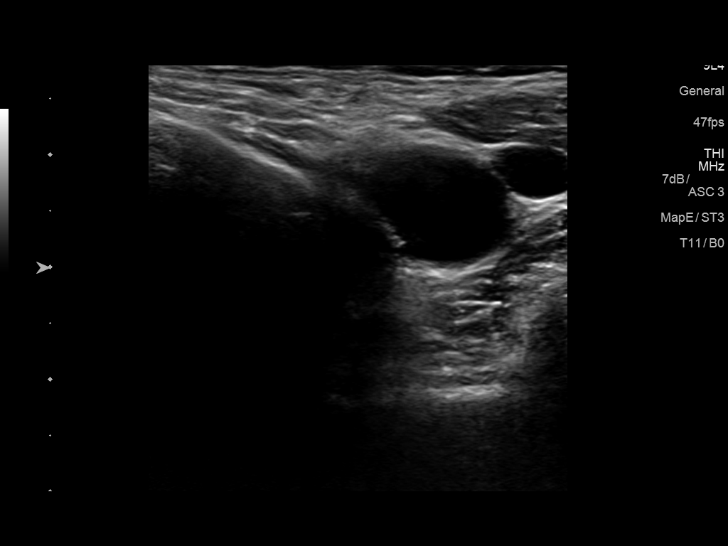
[im 42/65]
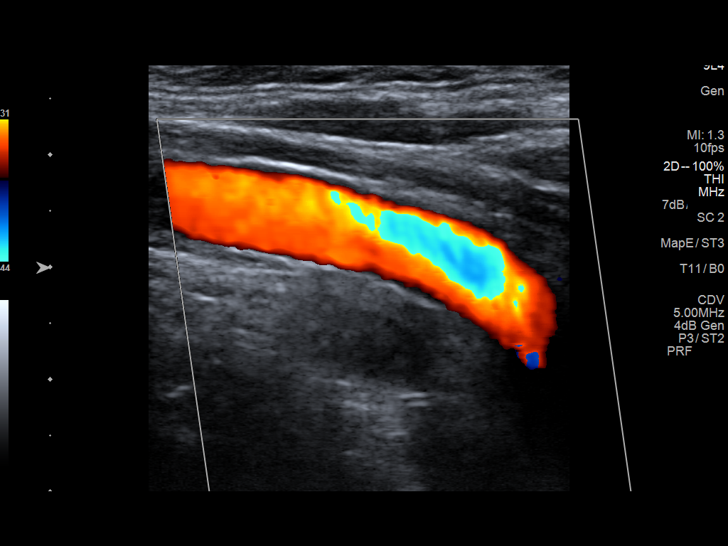
[im 48/65]
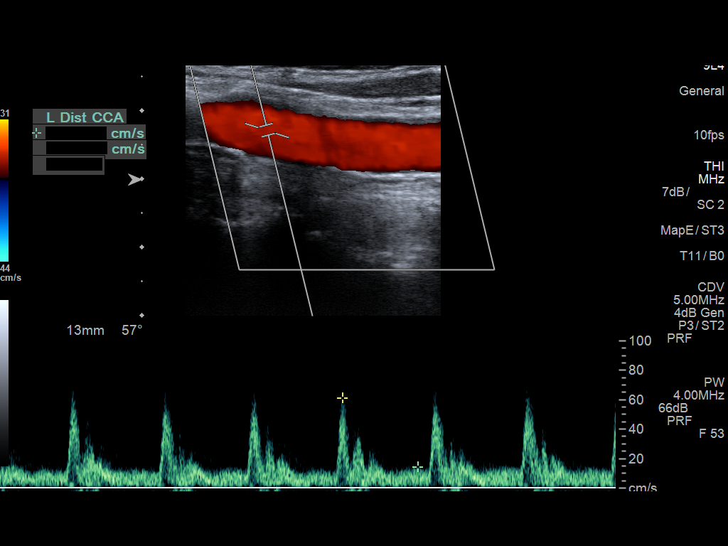
[im 53/65]
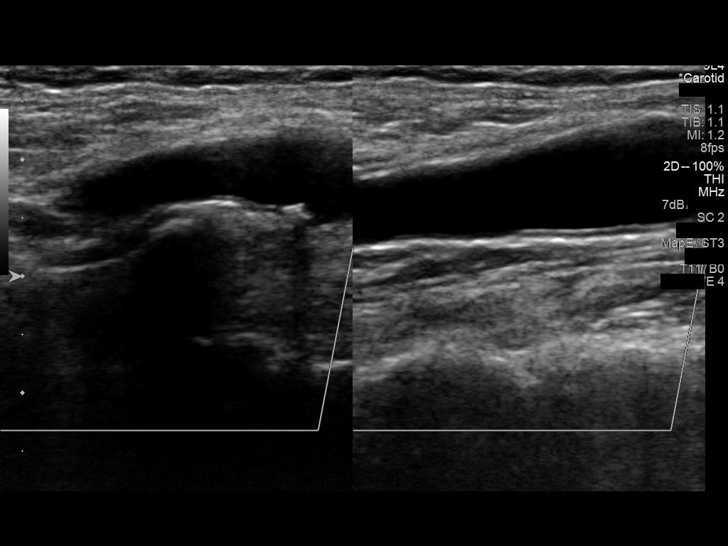
[im 59/65]
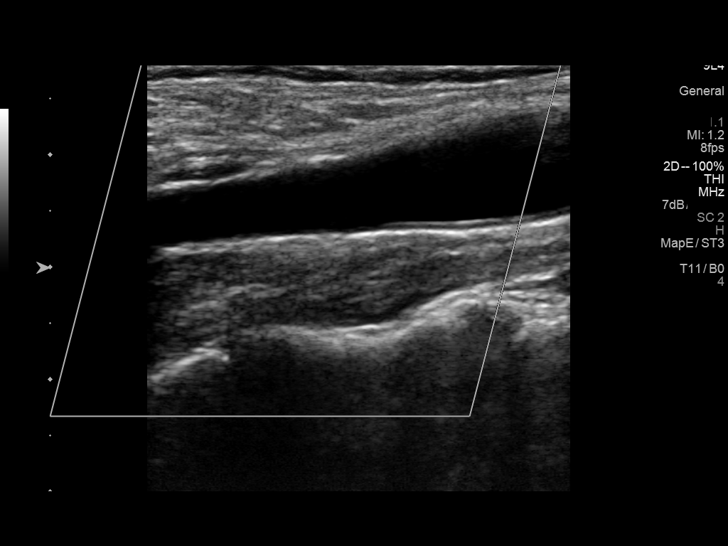
[im 65/65]
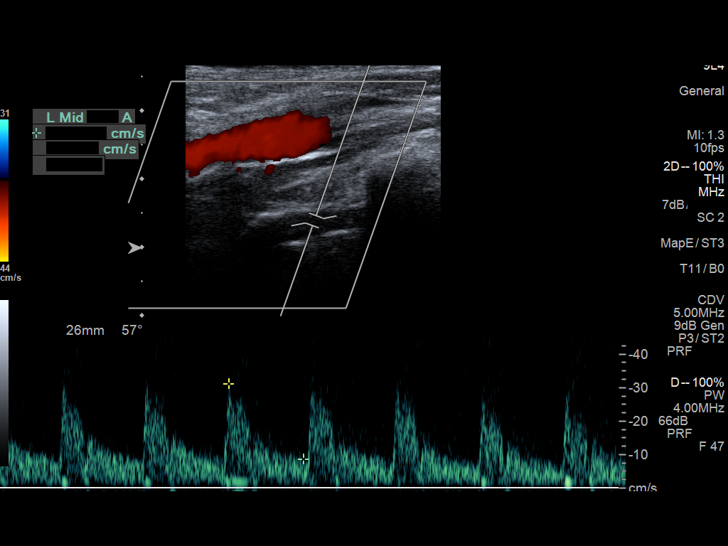

[13 of 24 positions shown; findings below may reference images not displayed]

FINDINGS: Criteria: Quantification of carotid stenosis is based on velocity
parameters that correlate the residual internal carotid diameter
with NASCET-based stenosis levels, using the diameter of the distal
internal carotid lumen as the denominator for stenosis measurement.

The following velocity measurements were obtained:

RIGHT

ICA: 94 cm/sec

CCA: 114 cm/sec

SYSTOLIC ICA/CCA RATIO:

ECA: 139 cm/sec

LEFT

ICA: 89 cm/sec

CCA: 134 cm/sec

SYSTOLIC ICA/CCA RATIO:

ECA: 102 cm/sec

RIGHT CAROTID ARTERY: Small amount of echogenic plaque at the right
carotid bulb. External carotid artery is patent with normal
waveform. Normal waveforms and velocities in the internal carotid
artery.

RIGHT VERTEBRAL ARTERY: Antegrade flow and normal waveform in the
right vertebral artery.

LEFT CAROTID ARTERY: Small amount of plaque at the left carotid
bulb. External carotid artery is patent with normal waveform. Normal
waveforms and velocities in the internal carotid artery.

LEFT VERTEBRAL ARTERY: Antegrade flow and normal waveform in the
left vertebral artery.

Upper extremity blood pressures: RIGHT: 137/72 LEFT: 132/82
IMPRESSION: Small amount of plaque at the carotid bulbs. Estimated degree of
stenosis in the internal carotid arteries is less than 50%
bilaterally.

Patent vertebral arteries with antegrade flow.

## 2019-07-13 NOTE — Progress Notes (Signed)
I agree with the above plan 

## 2019-07-14 DIAGNOSIS — D485 Neoplasm of uncertain behavior of skin: Secondary | ICD-10-CM | POA: Diagnosis not present

## 2019-07-14 DIAGNOSIS — L812 Freckles: Secondary | ICD-10-CM | POA: Diagnosis not present

## 2019-07-14 DIAGNOSIS — L820 Inflamed seborrheic keratosis: Secondary | ICD-10-CM | POA: Diagnosis not present

## 2019-07-14 DIAGNOSIS — L821 Other seborrheic keratosis: Secondary | ICD-10-CM | POA: Diagnosis not present

## 2019-07-14 DIAGNOSIS — Z85828 Personal history of other malignant neoplasm of skin: Secondary | ICD-10-CM | POA: Diagnosis not present

## 2019-07-14 DIAGNOSIS — L853 Xerosis cutis: Secondary | ICD-10-CM | POA: Diagnosis not present

## 2019-07-14 DIAGNOSIS — L570 Actinic keratosis: Secondary | ICD-10-CM | POA: Diagnosis not present

## 2019-09-23 ENCOUNTER — Other Ambulatory Visit: Payer: Self-pay | Admitting: Cardiovascular Disease

## 2019-09-24 IMAGING — CT CT HEAD W/O CM
3 series · 15 of 47 positions shown, 18 images · non-contrast
Comparison: None.

CLINICAL DATA: Several episodes of Near syncope for 6-7 months. 1
episode of syncope last month and again this am. Lac to top of head.
Hx of aneurysm rupture and repair in 8008 in [HOSPITAL] [HOSPITAL]

EXAM:
CT HEAD WITHOUT CONTRAST
TECHNIQUE: Contiguous axial images were obtained from the base of the skull
through the vertex without intravenous contrast.

[Series 2: head 5.0 h37s · axial · 0.46mm/px · z∈[+147,+272]mm · 9 of 30 slices shown, 12 images]
[im 3/30  brain]
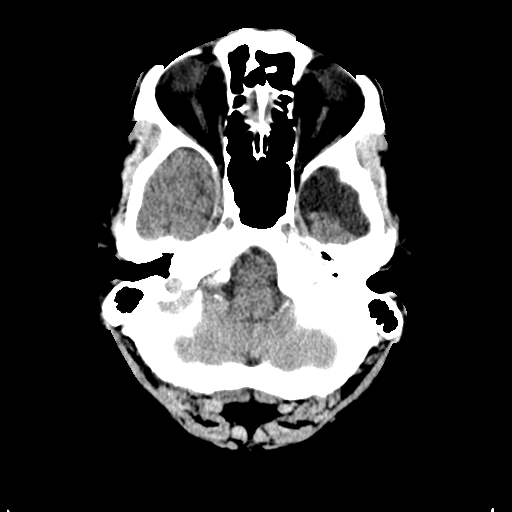
[im 3/30  bone]
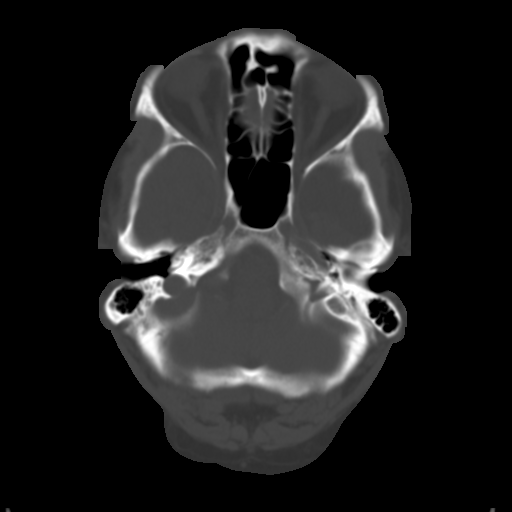
[im 6/30  brain]
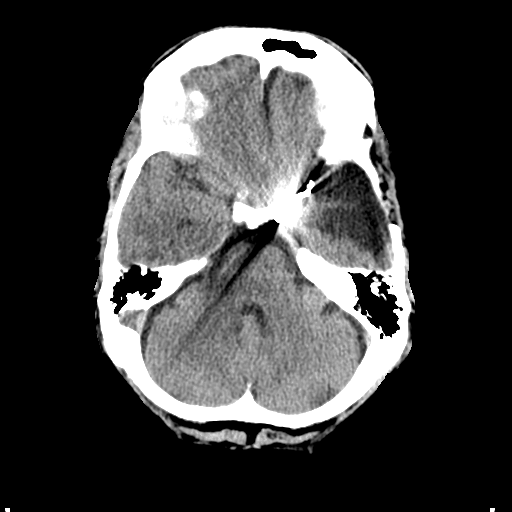
[im 9/30  brain]
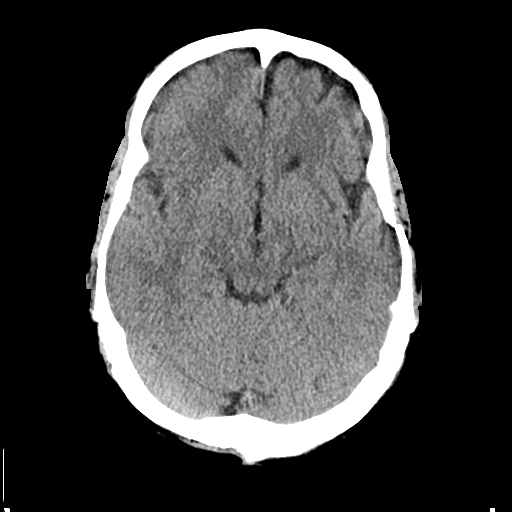
[im 12/30  brain]
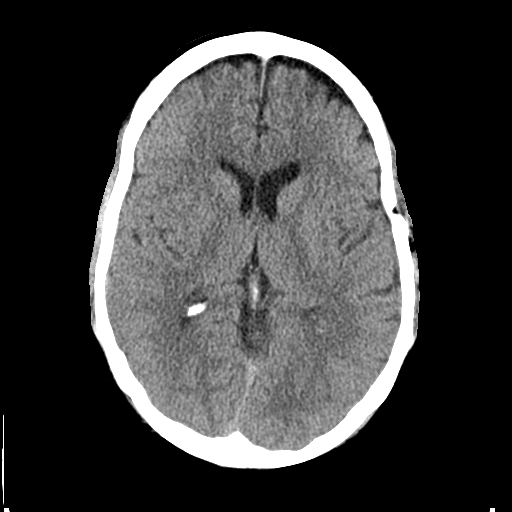
[im 16/30  brain]
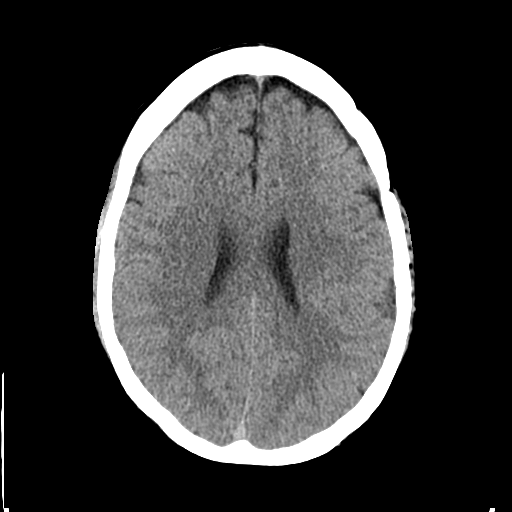
[im 16/30  bone]
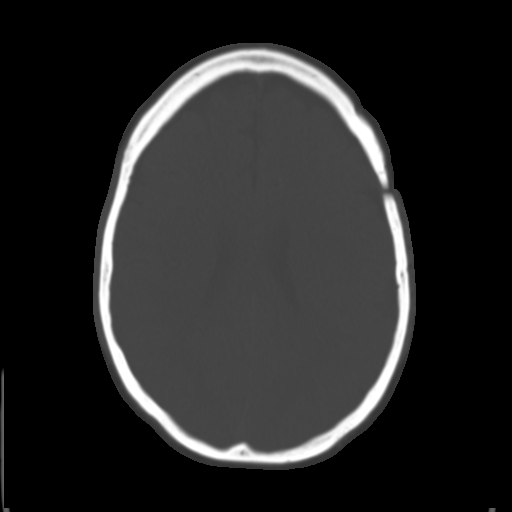
[im 19/30  brain]
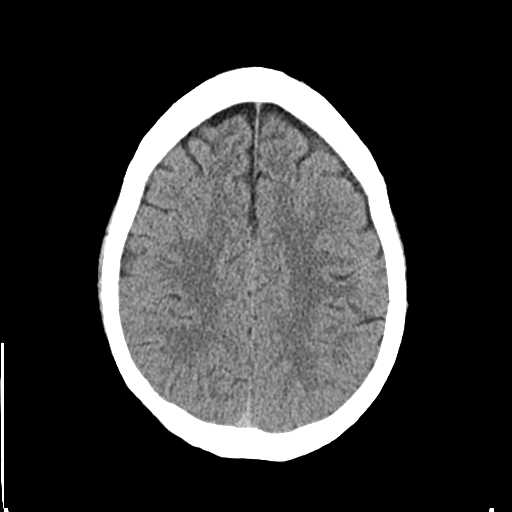
[im 22/30  brain]
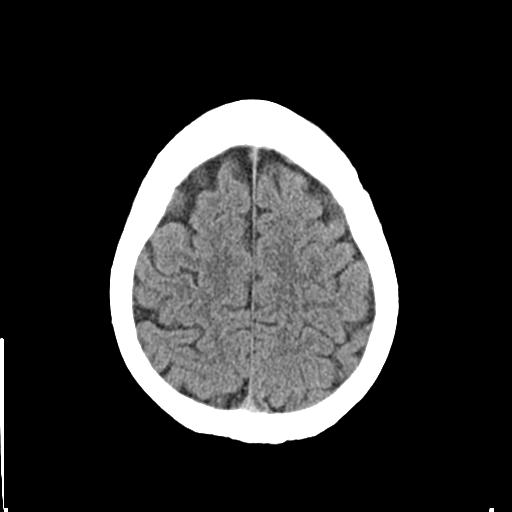
[im 25/30  brain]
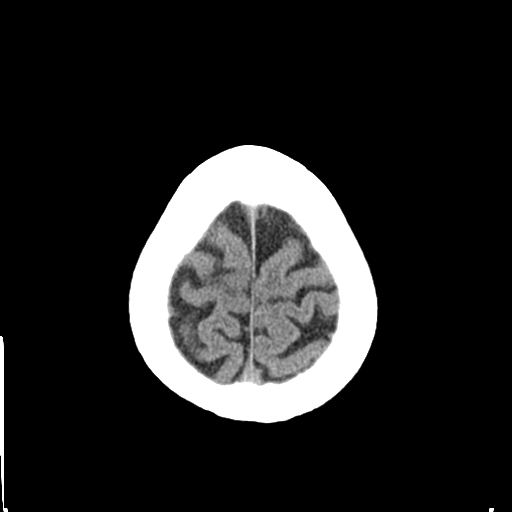
[im 28/30  brain]
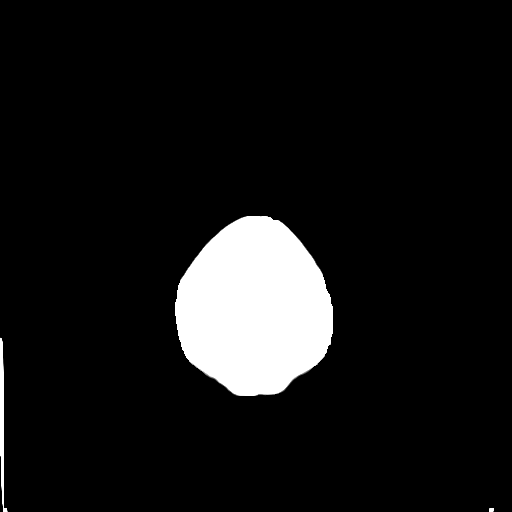
[im 28/30  bone]
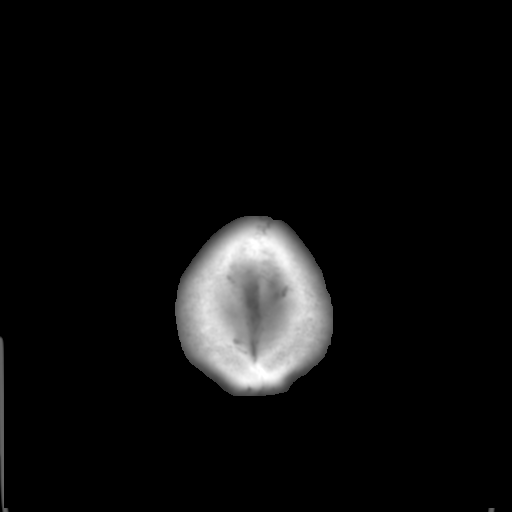

[Series 4: head 3.0 mpr cor · coronal · 0.29mm/px · 3 of 71 slices shown]
[im 24/71  brain]
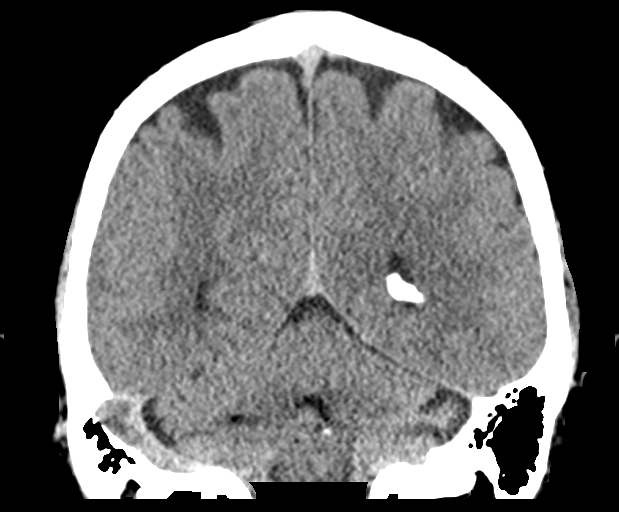
[im 32/71  brain]
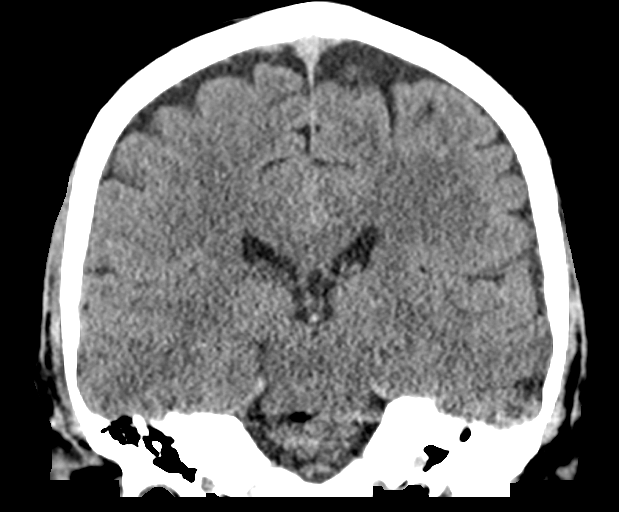
[im 39/71  brain]
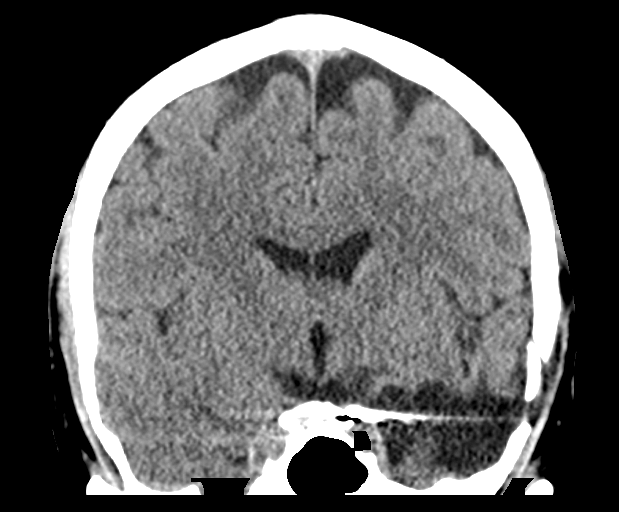

[Series 5: head 3.0 mpr sag · sagittal · 0.31mm/px · 3 of 56 slices shown]
[im 19/56  brain]
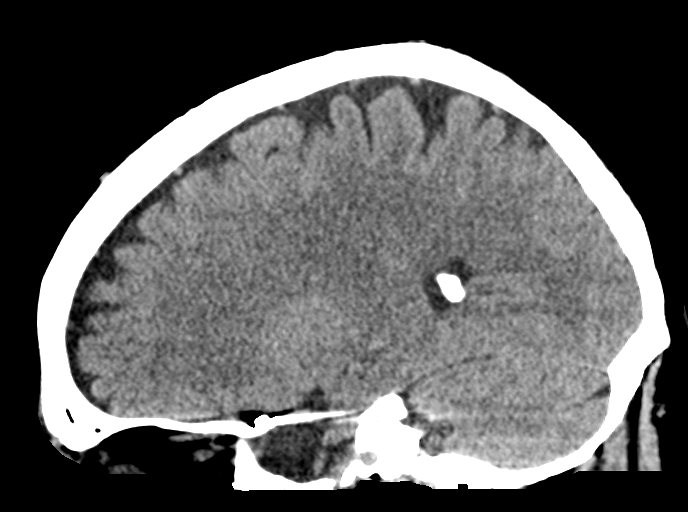
[im 28/56  brain]
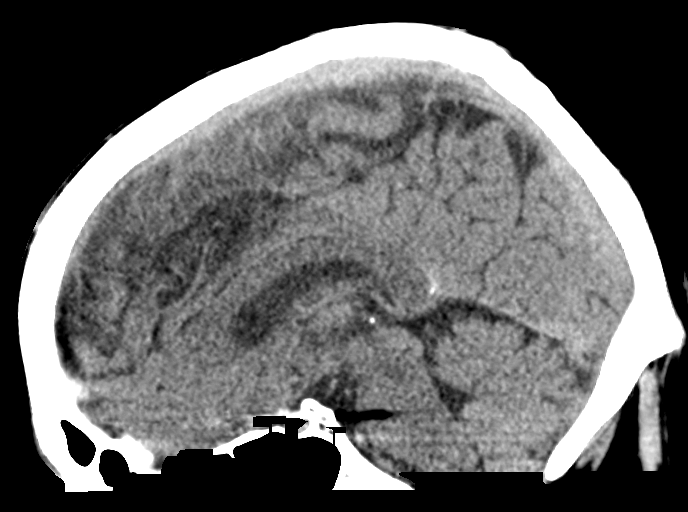
[im 37/56  brain]
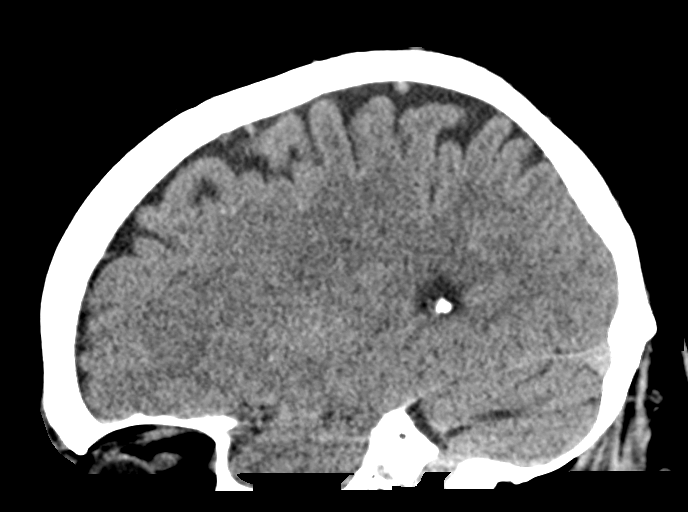

[15 of 47 positions shown; findings below may reference images not displayed]

FINDINGS: Brain: No acute infarction, hemorrhage or hydrocephalus. No masses
or mass effect.

There is encephalomalacia of the anterior left temporal lobe and a
left parasellar aneurysm clip reflecting the previous aneurysm
surgery reported history.

Vascular: No hyperdense vessel or unexpected calcification.

Skull: Status post left frontotemporal craniotomy. No fracture or
acute finding. No bone lesion.

Sinuses/Orbits: Visualize globes and orbits are unremarkable. The
visualized sinuses and mastoid air cells are clear.

Other: None.
IMPRESSION: 1. No acute intracranial abnormalities.
2. Chronic changes from a prior left frontotemporal craniotomy with
clipping of a left parasellar aneurysm.

## 2019-10-06 ENCOUNTER — Ambulatory Visit (INDEPENDENT_AMBULATORY_CARE_PROVIDER_SITE_OTHER): Payer: Medicare Other | Admitting: Adult Health

## 2019-10-06 ENCOUNTER — Encounter: Payer: Self-pay | Admitting: Adult Health

## 2019-10-06 ENCOUNTER — Other Ambulatory Visit: Payer: Self-pay

## 2019-10-06 VITALS — BP 110/70 | HR 65 | Temp 98.1°F | Wt 195.0 lb

## 2019-10-06 DIAGNOSIS — Z23 Encounter for immunization: Secondary | ICD-10-CM | POA: Diagnosis not present

## 2019-10-06 DIAGNOSIS — R0789 Other chest pain: Secondary | ICD-10-CM | POA: Diagnosis not present

## 2019-10-06 NOTE — Patient Instructions (Signed)
Your EKG was normal.   I believe that mild discomfort you are feeling is more muscular. You can take over the counter medications such as motrin, tylenol, and use a heating pad. Gentle stretching exercises will also be beneficial.

## 2019-10-06 NOTE — Addendum Note (Signed)
Addended by: Wyvonne Lenz on: 10/06/2019 10:37 AM   Modules accepted: Orders

## 2019-10-06 NOTE — Progress Notes (Signed)
Subjective:    Patient ID: James Myers, male    DOB: 1950-07-19, 69 y.o.   MRN: [id number]  HPI 69 year old male who  has a past medical history of Arthritis, Brain aneurysm, Chicken pox, GERD (gastroesophageal reflux disease), Melanoma ([place]), and Stroke ([place]).  He presents to the office today for an acute issue of left upper quadrant pain and left sided chest " discomfort" that started about 2 weeks ago. He describes the pain as" an ache"  and rates is the pain 1-2/10. Pain is constant. Alleviating factor is laying down in bed.   He denies SOB, palpitations, n/v/d/c  He has been working out more at [employer]   Review of Systems See HPI  Past Medical History:  Diagnosis Date  . Arthritis   . Brain aneurysm   . Chicken pox   . GERD (gastroesophageal reflux disease)   . Melanoma ([place])   . Stroke [facility])     Social History   Socioeconomic History  . Marital status: Married    Spouse name: Not on file  . Number of children: 1  . Years of education: 30  . Highest education level: Not on file  Occupational History  . 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 Smoking status: Never Smoker  . Smokeless tobacco: Never Used  Substance and Sexual Activity  . Alcohol use: Yes    Comment: 4 oz daily  . Drug use: No  . Sexual activity: Yes  Other Topics Concern  . Not on file  Social History Narrative   Born and raised in [place]. Currently resided in a private residence with his wife. Denies pets.    Fun: Golf and likes to swim / yard-work   Denies religious beliefs effecting healthcare.   One Child    Two grandchildren       Social Determinants of Radio broadcast assistant Strain:   . Difficulty of Paying Living Expenses: Not on file  Food Insecurity:   . Worried About Charity fundraiser in the Last Year: Not on file  . Ran Out of Food in the Last Year: Not on file  Transportation Needs:   . Lack of Transportation (Medical): Not on file   . Lack of Transportation (Non-Medical): Not on file  Physical Activity:   . Days of Exercise per Week: Not on file  . Minutes of Exercise per Session: Not on file  Stress:   . Feeling of Stress : Not on file  Social Connections:   . Frequency of Communication with Friends and Family: Not on file  . Frequency of Social Gatherings with Friends and Family: Not on file  . Attends Religious Services: Not on file  . Active Member of Clubs or Organizations: Not on file  . Attends Archivist Meetings: Not on file  . Marital Status: Not on file  Intimate Partner Violence:   . Fear of Current or Ex-Partner: Not on file  . Emotionally Abused: Not on file  . Physically Abused: Not on file  . Sexually Abused: Not on file    Past Surgical History:  Procedure Laterality Date  . BRAIN SURGERY  1991  . TONSILLECTOMY AND ADENOIDECTOMY      Family History  Problem Relation Age of Onset  . Aneurysm Mother        brain   . Arthritis Mother   . Parkinson's disease Mother   . Stroke  Father   . Arthritis Father   . Syncope episode Father   . Arthritis Maternal Grandmother   . Diabetes Maternal Grandfather   . Arthritis Paternal Grandmother   . Tremor Brother     No Known Allergies  Current Outpatient Medications on File Prior to Visit  Medication Sig Dispense Refill  . diclofenac Sodium (VOLTAREN) 1 % GEL Apply topically 4 (four) times daily as needed.    . Lacosamide (VIMPAT) 100 MG TABS Take 1 tablet (100 mg total) by mouth 2 (two) times daily. 180 tablet 1  . Multiple Vitamins-Iron (MULTIVITAMINS WITH IRON) TABS tablet Take 1 tablet by mouth daily.    . rosuvastatin (CRESTOR) 10 MG tablet TAKE ONE TABLET BY MOUTH DAILY 30 tablet 5   No current facility-administered medications on file prior to visit.    BP 110/70 (BP Location: Left Arm, Patient Position: Sitting, Cuff Size: Normal)   Pulse 65   Temp 98.1 F (36.7 C) (Oral)   Wt 195 lb (88.5 kg)   SpO2 97%   BMI 26.45  kg/m       Objective:   Physical Exam Vitals and nursing note reviewed.  Constitutional:      Appearance: Normal appearance.  Cardiovascular:     Rate and Rhythm: Normal rate and regular rhythm.     Pulses: Normal pulses.     Heart sounds: Normal heart sounds.  Pulmonary:     Effort: Pulmonary effort is normal.     Breath sounds: Normal breath sounds.  Abdominal:     General: Abdomen is flat. Bowel sounds are normal. There is no distension.     Palpations: Abdomen is soft.     Tenderness: There is no abdominal tenderness.  Musculoskeletal:        General: Tenderness present. Normal range of motion.     Comments: Very mild reproducible left sided chest pain with palpation    Skin:    General: Skin is warm and dry.  Neurological:     General: No focal deficit present.     Mental Status: He is oriented to person, place, and time.  Psychiatric:        Mood and Affect: Mood normal.        Behavior: Behavior normal.        Thought Content: Thought content normal.        Judgment: Judgment normal.       Assessment & Plan:  1. Atypical chest pain - EKG 12-Lead- Marked sinus  Bradycardia. Rate 49. - Consistent with previous EKG's with slightly lower pulse  - Likely muscular in origin  - Advised Tylenol, heating pad, and gentle stretching exercises - Follow up as needed  Dorothyann Peng, NP

## 2019-11-05 DIAGNOSIS — Z23 Encounter for immunization: Secondary | ICD-10-CM | POA: Diagnosis not present

## 2019-11-12 ENCOUNTER — Telehealth: Payer: Self-pay | Admitting: Adult Health

## 2019-11-18 ENCOUNTER — Other Ambulatory Visit: Payer: Self-pay

## 2019-11-18 ENCOUNTER — Ambulatory Visit (INDEPENDENT_AMBULATORY_CARE_PROVIDER_SITE_OTHER): Payer: Medicare Other

## 2019-11-18 VITALS — BP 120/62 | HR 55 | Temp 98.0°F | Ht 72.0 in | Wt 198.0 lb

## 2019-11-18 DIAGNOSIS — Z0000 Encounter for general adult medical examination without abnormal findings: Secondary | ICD-10-CM

## 2019-11-18 DIAGNOSIS — Z23 Encounter for immunization: Secondary | ICD-10-CM | POA: Diagnosis not present

## 2019-11-18 NOTE — Patient Instructions (Signed)
James Myers , Thank you for taking time to come for your Medicare Wellness Visit. I appreciate your ongoing commitment to your health goals. Please review the following plan we discussed and let me know if I can assist you in the future.   Screening recommendations/referrals: Colonoscopy: Up to date, next due 06/07/2020 Recommended yearly ophthalmology/optometry visit for glaucoma screening and checkup Recommended yearly dental visit for hygiene and checkup  Vaccinations: Influenza vaccine: Up to date, next due fall 2022 Pneumococcal vaccine: Completed series Tdap vaccine: Currently due, you may receive at your next office visit if you wish  Shingles vaccine: Completed series    Advanced directives: Next time in office we can give you the Advanced directive paperwork to complete  Conditions/risks identified: None   Next appointment: None   Preventive Care 80 Years and Older, Male Preventive care refers to lifestyle choices and visits with your health care provider that can promote health and wellness. What does preventive care include?  A yearly physical exam. This is also called an annual well check.  Dental exams once or twice a year.  Routine eye exams. Ask your health care provider how often you should have your eyes checked.  Personal lifestyle choices, including:  Daily care of your teeth and gums.  Regular physical activity.  Eating a healthy diet.  Avoiding tobacco and drug use.  Limiting alcohol use.  Practicing safe sex.  Taking low doses of aspirin every day.  Taking vitamin and mineral supplements as recommended by your health care provider. What happens during an annual well check? The services and screenings done by your health care provider during your annual well check will depend on your age, overall health, lifestyle risk factors, and family history of disease. Counseling  Your health care provider may ask you questions about your:  Alcohol  use.  Tobacco use.  Drug use.  Emotional well-being.  Home and relationship well-being.  Sexual activity.  Eating habits.  History of falls.  Memory and ability to understand (cognition).  Work and work Statistician. Screening  You may have the following tests or measurements:  Height, weight, and BMI.  Blood pressure.  Lipid and cholesterol levels. These may be checked every 5 years, or more frequently if you are over 56 years old.  Skin check.  Lung cancer screening. You may have this screening every year starting at age 68 if you have a 30-pack-year history of smoking and currently smoke or have quit within the past 15 years.  Fecal occult blood test (FOBT) of the stool. You may have this test every year starting at age 15.  Flexible sigmoidoscopy or colonoscopy. You may have a sigmoidoscopy every 5 years or a colonoscopy every 10 years starting at age 50.  Prostate cancer screening. Recommendations will vary depending on your family history and other risks.  Hepatitis C blood test.  Hepatitis B blood test.  Sexually transmitted disease (STD) testing.  Diabetes screening. This is done by checking your blood sugar (glucose) after you have not eaten for a while (fasting). You may have this done every 1-3 years.  Abdominal aortic aneurysm (AAA) screening. You may need this if you are a current or former smoker.  Osteoporosis. You may be screened starting at age 22 if you are at high risk. Talk with your health care provider about your test results, treatment options, and if necessary, the need for more tests. Vaccines  Your health care provider may recommend certain vaccines, such as:  Influenza vaccine.  This is recommended every year.  Tetanus, diphtheria, and acellular pertussis (Tdap, Td) vaccine. You may need a Td booster every 10 years.  Zoster vaccine. You may need this after age 50.  Pneumococcal 13-valent conjugate (PCV13) vaccine. One dose is  recommended after age 79.  Pneumococcal polysaccharide (PPSV23) vaccine. One dose is recommended after age 75. Talk to your health care provider about which screenings and vaccines you need and how often you need them. This information is not intended to replace advice given to you by your health care provider. Make sure you discuss any questions you have with your health care provider. Document Released: 02/11/2015 Document Revised: 10/05/2015 Document Reviewed: 11/16/2014 Elsevier Interactive Patient Education  2017 [employer] Prevention in the Home Falls can cause injuries. They can happen to people of all ages. There are many things you can do to make your home safe and to help prevent falls. What can I do on the outside of my home?  Regularly fix the edges of walkways and driveways and fix any cracks.  Remove anything that might make you trip as you walk through a door, such as a raised step or threshold.  Trim any bushes or trees on the path to your home.  Use bright outdoor lighting.  Clear any walking paths of anything that might make someone trip, such as rocks or tools.  Regularly check to see if handrails are loose or broken. Make sure that both sides of any steps have handrails.  Any raised decks and porches should have guardrails on the edges.  Have any leaves, snow, or ice cleared regularly.  Use sand or salt on walking paths during winter.  Clean up any spills in your garage right away. This includes oil or grease spills. What can I do in the bathroom?  Use night lights.  Install grab bars by the toilet and in the tub and shower. Do not use towel bars as grab bars.  Use non-skid mats or decals in the tub or shower.  If you need to sit down in the shower, use a plastic, non-slip stool.  Keep the floor dry. Clean up any water that spills on the floor as soon as it happens.  Remove soap buildup in the tub or shower regularly.  Attach bath mats  securely with double-sided non-slip rug tape.  Do not have throw rugs and other things on the floor that can make you trip. What can I do in the bedroom?  Use night lights.  Make sure that you have a light by your bed that is easy to reach.  Do not use any sheets or blankets that are too big for your bed. They should not hang down onto the floor.  Have a firm chair that has side arms. You can use this for support while you get dressed.  Do not have throw rugs and other things on the floor that can make you trip. What can I do in the kitchen?  Clean up any spills right away.  Avoid walking on wet floors.  Keep items that you use a lot in easy-to-reach places.  If you need to reach something above you, use a strong step stool that has a grab bar.  Keep electrical cords out of the way.  Do not use floor polish or wax that makes floors slippery. If you must use wax, use non-skid floor wax.  Do not have throw rugs and other things on the floor that can make  you trip. What can I do with my stairs?  Do not leave any items on the stairs.  Make sure that there are handrails on both sides of the stairs and use them. Fix handrails that are broken or loose. Make sure that handrails are as long as the stairways.  Check any carpeting to make sure that it is firmly attached to the stairs. Fix any carpet that is loose or worn.  Avoid having throw rugs at the top or bottom of the stairs. If you do have throw rugs, attach them to the floor with carpet tape.  Make sure that you have a light switch at the top of the stairs and the bottom of the stairs. If you do not have them, ask someone to add them for you. What else can I do to help prevent falls?  Wear shoes that:  Do not have high heels.  Have rubber bottoms.  Are comfortable and fit you well.  Are closed at the toe. Do not wear sandals.  If you use a stepladder:  Make sure that it is fully opened. Do not climb a closed  stepladder.  Make sure that both sides of the stepladder are locked into place.  Ask someone to hold it for you, if possible.  Clearly mark and make sure that you can see:  Any grab bars or handrails.  First and last steps.  Where the edge of each step is.  Use tools that help you move around (mobility aids) if they are needed. These include:  Canes.  Walkers.  Scooters.  Crutches.  Turn on the lights when you go into a dark area. Replace any light bulbs as soon as they burn out.  Set up your furniture so you have a clear path. Avoid moving your furniture around.  If any of your floors are uneven, fix them.  If there are any pets around you, be aware of where they are.  Review your medicines with your doctor. Some medicines can make you feel dizzy. This can increase your chance of falling. Ask your doctor what other things that you can do to help prevent falls. This information is not intended to replace advice given to you by your health care provider. Make sure you discuss any questions you have with your health care provider. Document Released: 11/11/2008 Document Revised: 06/23/2015 Document Reviewed: 02/19/2014 Elsevier Interactive Patient Education  2017 [employer].

## 2019-11-18 NOTE — Progress Notes (Signed)
Subjective:   James Myers is a 69 y.o. male who presents for an Initial Medicare Annual Wellness Visit.  Review of Systems    N/A  Cardiac Risk Factors include: advanced age (>39men, >73 women);male gender     Objective:    Today's Vitals   11/18/19 0946  BP: 120/62  Pulse: (!) 55  Temp: 98 F (36.7 C)  TempSrc: Oral  SpO2: 98%  Weight: 198 lb (89.8 kg)  Height: 6' (1.829 m)   Body mass index is 26.85 kg/m.  Advanced Directives 11/18/2019 12/28/2015  Does Patient Have a Medical Advance Directive? Yes Yes  Type of Advance Directive - [place];Living will  Does patient want to make changes to medical advance directive? Yes (MAU/Ambulatory/Procedural Areas - Information given) -    Current Medications (verified) Outpatient Encounter Medications as of 11/18/2019  Medication Sig  . diclofenac Sodium (VOLTAREN) 1 % GEL Apply topically 4 (four) times daily as needed.  . Lacosamide (VIMPAT) 100 MG TABS Take 1 tablet (100 mg total) by mouth 2 (two) times daily.  . Multiple Vitamins-Iron (MULTIVITAMINS WITH IRON) TABS tablet Take 1 tablet by mouth daily.  . rosuvastatin (CRESTOR) 10 MG tablet TAKE ONE TABLET BY MOUTH DAILY   No facility-administered encounter medications on file as of 11/18/2019.    Allergies (verified) Patient has no known allergies.   History: Past Medical History:  Diagnosis Date  . Arthritis   . Brain aneurysm   . Chicken pox   . GERD (gastroesophageal reflux disease)   . Melanoma ([place])   . Stroke [facility])    Past Surgical History:  Procedure Laterality Date  . BRAIN SURGERY  1991  . TONSILLECTOMY AND ADENOIDECTOMY     Family History  Problem Relation Age of Onset  . Aneurysm Mother        brain   . Arthritis Mother   . Parkinson's disease Mother   . Stroke Father   . Arthritis Father   . Syncope episode Father   . Arthritis Maternal Grandmother   . Diabetes Maternal Grandfather   . Arthritis Paternal Grandmother     . Tremor Brother    Social History   Socioeconomic History  . Marital status: Married    Spouse name: Not on file  . Number of children: 1  . Years of education: 65  . Highest education level: Not on file  Occupational History  . 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 Smoking status: Never Smoker  . Smokeless tobacco: Never Used  Substance and Sexual Activity  . Alcohol use: Yes    Comment: 4 oz daily  . Drug use: No  . Sexual activity: Yes  Other Topics Concern  . Not on file  Social History Narrative   Born and raised in [place]. Currently resided in a private residence with his wife. Denies pets.    Fun: Golf and likes to swim / yard-work   Denies religious beliefs effecting healthcare.   One Child    Two grandchildren       Social Determinants of Health   Financial Resource Strain: Low Risk   . Difficulty of Paying Living Expenses: Not hard at all  Food Insecurity: No Food Insecurity  . Worried About Charity fundraiser in the Last Year: Never true  . Ran Out of Food in the Last Year: Never true  Transportation Needs: No Transportation Needs  . Lack of Transportation (  Medical): No  . Lack of Transportation (Non-Medical): No  Physical Activity: Sufficiently Active  . Days of Exercise per Week: 4 days  . Minutes of Exercise per Session: 60 min  Stress: No Stress Concern Present  . Feeling of Stress : Not at all  Social Connections: Moderately Integrated  . Frequency of Communication with Friends and Family: More than three times a week  . Frequency of Social Gatherings with Friends and Family: More than three times a week  . Attends Religious Services: Never  . Active Member of Clubs or Organizations: Yes  . Attends Archivist Meetings: More than 4 times per year  . Marital Status: Married    Tobacco Counseling Counseling given: Not Answered   Clinical Intake:  Pre-visit preparation completed: Yes  Pain :  No/denies pain     Nutritional Risks: None Diabetes: No  How often do you need to have someone help you when you read instructions, pamphlets, or other written materials from your doctor or pharmacy?: 1 - Never What is the last grade level you completed in school?: Undergraduate degree  Diabetic?No  Interpreter Needed?: No  Information entered by :: [place] of Daily Living In your present state of health, do you have any difficulty performing the following activities: 11/18/2019  Hearing? Y  Comment Has tinnitus to bilateral ears, reports some hearing loss  Vision? Y  Comment sees a floaters in both eyes  Difficulty concentrating or making decisions? N  Walking or climbing stairs? N  Dressing or bathing? N  Doing errands, shopping? N  Preparing Food and eating ? N  Using the Toilet? N  In the past six months, have you accidently leaked urine? Y  Comment has some occassional dribble  Do you have problems with loss of bowel control? N  Managing your Medications? N  Housekeeping or managing your Housekeeping? N  Some recent data might be hidden    Patient Care Team: Dorothyann Peng, NP as PCP - General (Family Medicine) Troy Sine, MD as PCP - Cardiology (Cardiology)  Indicate any recent Medical Services you may have received from other than [facility] providers in the past year (date may be approximate).     Assessment:   This is a routine wellness examination for [facility].  Hearing/Vision screen  Hearing Screening   125Hz  250Hz  500Hz  1000Hz  2000Hz  3000Hz  4000Hz  6000Hz  8000Hz   Right ear:           Left ear:           Vision Screening Comments: Patient states has been 7 years since last eye exams. Has some floaters in vision   Dietary issues and exercise activities discussed: Current Exercise Habits: Home exercise routine, Type of exercise: walking;treadmill;stretching, Time (Minutes): 60, Frequency (Times/Week): 4, Weekly Exercise (Minutes/Week): 240,  Intensity: Moderate, Exercise limited by: neurologic condition(s)  Goals    . Exercise 150 min/wk Moderate Activity      Depression Screen PHQ 2/9 Scores 11/18/2019  PHQ - 2 Score 0  PHQ- 9 Score 0    Fall Risk Fall Risk  11/18/2019 12/24/2018 12/02/2017  Falls in the past year? 0 0 0  Comment - Emmi Telephone Survey: data to providers prior to load -  Number falls in past yr: 0 - -  Injury with Fall? 0 - -  Risk for fall due to : No Fall Risks - -    Any stairs in or around the home? Yes  If so, are there any  without handrails? No  Home free of loose throw rugs in walkways, pet beds, electrical cords, etc? Yes  Adequate lighting in your home to reduce risk of falls? Yes   ASSISTIVE DEVICES UTILIZED TO PREVENT FALLS:  Life alert? No  Use of a cane, walker or w/c? No  Grab bars in the bathroom? No  Shower chair or bench in shower? No  Elevated toilet seat or a handicapped toilet? Yes   TIMED UP AND GO:  Was the test performed? Yes .  Length of time to ambulate 10 feet: 5 sec.   Gait steady and fast without use of assistive device  Cognitive Function:     6CIT Screen 11/18/2019  What Year? 0 points  What month? 0 points  What time? 0 points  Count back from 20 0 points  Months in reverse 0 points  Repeat phrase 0 points  Total Score 0    Immunizations Immunization History  Administered Date(s) Administered  . Fluad Quad(high Dose 65+) 10/06/2019  . Influenza, High Dose Seasonal PF 12/09/2015, 10/24/2018  . PFIZER SARS-COV-2 Vaccination 02/18/2019, 03/11/2019  . Pneumococcal Conjugate-13 07/22/2017  . Pneumococcal Polysaccharide-23 12/01/2013  . Zoster Recombinat (Shingrix) 10/24/2018, 01/13/2019    TDAP status: Due, Education has been provided regarding the importance of this vaccine. Advised may receive this vaccine at local pharmacy or Health Dept. Aware to provide a copy of the vaccination record if obtained from local pharmacy or Health Dept.  Verbalized acceptance and understanding. Flu Vaccine status: Up to date Pneumococcal vaccine status: Up to date Covid-19 vaccine status: Completed vaccines  Qualifies for Shingles Vaccine? Yes   Zostavax completed No   Shingrix Completed?: Yes  Screening Tests Health Maintenance  Topic Date Due  . Hepatitis C Screening  Never done  . TETANUS/TDAP  Never done  . PNA vac Low Risk Adult (2 of 2 - PPSV23) 12/02/2018  . COLONOSCOPY  06/07/2020  . INFLUENZA VACCINE  Completed  . COVID-19 Vaccine  Completed    Health Maintenance  Health Maintenance Due  Topic Date Due  . Hepatitis C Screening  Never done  . TETANUS/TDAP  Never done  . PNA vac Low Risk Adult (2 of 2 - PPSV23) 12/02/2018    Colorectal cancer screening: Completed 06/07/2020. Repeat every 10 years  Lung Cancer Screening: (Low Dose CT Chest recommended if Age 59-80 years, 30 pack-year currently smoking OR have quit w/in 15years.) does not qualify.   Lung Cancer Screening Referral: N/A   Additional Screening:  Hepatitis C Screening: does qualify;  Vision Screening: Recommended annual ophthalmology exams for early detection of glaucoma and other disorders of the eye. Is the patient up to date with their annual eye exam?  No  Who is the provider or what is the name of the office in which the patient attends annual eye exams? Dr.Stoneburner If pt is not established with a provider, would they like to be referred to a provider to establish care? No .   Dental Screening: Recommended annual dental exams for proper oral hygiene  Community Resource Referral / Chronic Care Management: CRR required this visit?  No   CCM required this visit?  No      Plan:     I have personally reviewed and noted the following in the patient's chart:   . Medical and social history . Use of alcohol, tobacco or illicit drugs  . Current medications and supplements . Functional ability and status . Nutritional status . Physical  activity .  Advanced directives . List of other physicians . Hospitalizations, surgeries, and ER visits in previous 12 months . Vitals . Screenings to include cognitive, depression, and falls . Referrals and appointments  In addition, I have reviewed and discussed with patient certain preventive protocols, quality metrics, and best practice recommendations. A written personalized care plan for preventive services as well as general preventive health recommendations were provided to patient.     Ofilia Neas, LPN   [id number]   Nurse Notes: None

## 2019-11-19 NOTE — Telephone Encounter (Signed)
error 

## 2020-01-06 ENCOUNTER — Ambulatory Visit (INDEPENDENT_AMBULATORY_CARE_PROVIDER_SITE_OTHER): Payer: Medicare Other | Admitting: Adult Health

## 2020-01-06 ENCOUNTER — Encounter: Payer: Self-pay | Admitting: Adult Health

## 2020-01-06 ENCOUNTER — Other Ambulatory Visit: Payer: Self-pay

## 2020-01-06 VITALS — BP 137/75 | HR 60 | Ht 71.0 in | Wt 197.0 lb

## 2020-01-06 DIAGNOSIS — G40209 Localization-related (focal) (partial) symptomatic epilepsy and epileptic syndromes with complex partial seizures, not intractable, without status epilepticus: Secondary | ICD-10-CM | POA: Diagnosis not present

## 2020-01-06 MED ORDER — VIMPAT 100 MG PO TABS
100.0000 mg | ORAL_TABLET | Freq: Two times a day (BID) | ORAL | 1 refills | Status: DC
Start: 2020-01-06 — End: 2020-07-18

## 2020-01-06 MED ORDER — VIMPAT 100 MG PO TABS
100.0000 mg | ORAL_TABLET | Freq: Two times a day (BID) | ORAL | 1 refills | Status: DC
Start: 2020-01-06 — End: 2020-01-06

## 2020-01-06 NOTE — Patient Instructions (Addendum)
Your Plan:  Continue Vimpat 100mg  twice daily for seizure prevention - refill provided    Follow-up in 1 year or call earlier if needed     Thank you for coming to see [place] at [facility]. I hope we have been able to provide you high quality care today.  You may receive a patient satisfaction survey over the next few weeks. We would appreciate your feedback and comments so that we may continue to improve ourselves and the health of our patients.

## 2020-01-06 NOTE — Progress Notes (Signed)
I agree with the above plan 

## 2020-01-06 NOTE — Progress Notes (Signed)
[facility] [address] [place]. [place] [address] [phone]       OFFICE FOLLOW UP NOTE  James. James Myers Date of Birth:  08/22/1950 Medical Record Number:  [id number]   Referring MD: Shelva Majestic Reason for Referral:  Passing out episodes   Chief Complaint  Patient presents with  . Follow-up    [place] RM  . Seizures    pt said he has no new sx.       HPI:   Today, 01/06/2020, James Myers returns for 89-month seizure follow-up.  Remains on Vimpat 100 mg twice daily without side effects and denies any reoccurring seizure events or symptoms.  Maintains ADLs and IADLs independently as well as driving without difficulty.  No concerns at this time.   History provided for reference purposes only Update 07/07/2019: Returns for seizure follow-up.  He has been doing well since prior visit 6 months ago without recurrent seizure activity remains on Vimpat 100 mg twice daily tolerating well. He does report cessation of all alcohol.  Blood pressure today 135/74.  He questions safety of arthritic pain medication such as naproxen in regards to seizures and use of Vimpat and possible interaction. He does use voltaren gel and aspercream as needed at night only. No other concerns at this time.   Initial visit 12/02/2017 ;James Myers is a pleasant 69 year old Caucasian male who has had multiple episodes of brief loss of consciousness since April 2017.  History is obtained from the patient and review of electronic medical records.  I personally reviewed imaging films.  He actually states that the few months prior to his first episode of passing out he used to have brief episodes of particular smell or taste and he would manage to control it and prevent it from getting worse.  The first episode he passed out while he was brushing teeth he had a feeling that something were to happen before he could control it he fell down got up quickly he was out for only few seconds.  Second episode occurred  when he was outside all of a sudden his wife who was standing behind him noticed that he was leaning to one side and started snorting when his wife began to wake him up he started jerking his arms and then woke up and was quite disoriented for a few minutes.  The third episode he fell in the kitchen floor on the hardwood floor and woke up in a minute.  He is felt he did not have a warning.  The wife also reports other episodes like a staring episode she weakness in the gym about 4 weeks ago when the patient was staring and was briefly unresponsive and not talking within 30 seconds he started speaking.  Patient has no prior history of known seizures significant head injury with loss of consciousness or intracranial issues except that he had a history of ruptured subarachnoid hemorrhage in 1991 for which he underwent craniotomy and surgical clipping in [place].  He apparently did quite well from that and had no significant physical deficits or cognitive issues.  He has been seen by cardiology who have ordered an echocardiogram which was unremarkable.  Carotid ultrasound done on 11/06/2017 showed no significant extracranial stenosis.  CT scan of the head on 10/28/2017 showed old left craniotomy defect and aneurysm clip on the left side anteriorly.  No acute abnormality.  Patient has not had an EEG.  He has not followed up with a neurologist.   It is  unclear if he can have an MRI because his aneurysm clip was in 1991 and I am not sure it is MRI compatible  Update 02/11/2018 ; he returns for follow-up after last visit 2 months ago.  He states he has had no further episodes of aura or passing out or seizure-like episodes.  He is still on Keppra XR 500 mg daily and states that he felt tired and hence did not feel like he needed to increase the dose further.  He complains of occasional positional dizziness when he bends down.  He is otherwise doing well without any new symptoms.  He had EEG done on 12/31/2017 which I  personally reviewed and was normal.  He had CT angiogram of the brain done on 12/11/2017 which also have reviewed shows no new aneurysms.  The previous surgical the clipped aneurysm is noted in the left cavernous distal carotid.  Patient has no new complaints today.  Update 08/19/2018: James Myers is a 69 year old male who is being seen today for follow-up regarding seizures.  He reports approximately 6 times daily experiences sensation of nausea, increased elevation, balance difficulties and fatigue which lasts for approximately 1 minute.  Denies loss of consciousness, dizziness, SOB or palpitations.  Unable to associate activity with the symptoms as he can be sitting still while he experiences these.  Prior seizure episodes consist of auras and passing out but he feels as though his most recent symptoms are different from his possible seizure-like episodes.  These have been present for approximately 1 month.  He does endorse increasing Keppra XR dose as recommended at prior visit from 500 mg daily to 500 mg twice daily approximately 1 month ago.  He denies any recurrent seizure-like episodes.  Blood pressure stable 127/72.  Heart rate 52.  He does not check vitals during or after these episodes.  Update 12/31/2018: James Myers is a 69 year old male who is being seen today for seizure follow-up.  Due to possible continued seizure activity, Keppra dosage increased after prior visit.  Initially doing well with increasing dose but then started experiencing more frequent episodes as described above therefore recommend transitioning to Lamictal.  He unfortunately experienced frequent episodes of diarrhea therefore transitioned to Vimpat.  He is currently on 100 mg twice daily which he has been tolerating well without any side effects or reoccurring seizure or seizure type activity.  He does report yesterday having a 30 second episode of vertigo but has not experienced vertigo symptoms since and denies any recent vertigo  prior to yesterday.  He did check blood pressure at that time and was elevated for him at 133/25.  Blood pressure at today's visit 118/64.  He does report recent weight loss with daily exercise and eating healthy.  No further concerns at this time.       ROS:   14 system review of systems is positive for those listed in HPI and all other systems negative  PMH:  Past Medical History:  Diagnosis Date  . Arthritis   . Brain aneurysm   . Chicken pox   . GERD (gastroesophageal reflux disease)   . Melanoma ([place])   . Stroke [facility])     Social History:  Social History   Socioeconomic History  . Marital status: Married    Spouse name: Not on file  . Number of children: 1  . Years of education: 83  . Highest education level: Not on file  Occupational History  . 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 Smoking status: Never Smoker  . Smokeless tobacco: Never Used  Substance and Sexual Activity  . Alcohol use: Yes    Comment: 4 oz daily  . Drug use: No  . Sexual activity: Yes  Other Topics Concern  . Not on file  Social History Narrative   Born and raised in [place]. Currently resided in a private residence with his wife. Denies pets.    Fun: Golf and likes to swim / yard-work   Denies religious beliefs effecting healthcare.   One Child    Two grandchildren       Social Determinants of Health   Financial Resource Strain: Low Risk   . Difficulty of Paying Living Expenses: Not hard at all  Food Insecurity: No Food Insecurity  . Worried About Charity fundraiser in the Last Year: Never true  . Ran Out of Food in the Last Year: Never true  Transportation Needs: No Transportation Needs  . Lack of Transportation (Medical): No  . Lack of Transportation (Non-Medical): No  Physical Activity: Sufficiently Active  . Days of Exercise per Week: 4 days  . Minutes of Exercise per Session: 60 min  Stress: No Stress Concern Present  . Feeling of Stress  : Not at all  Social Connections: Moderately Integrated  . Frequency of Communication with Friends and Family: More than three times a week  . Frequency of Social Gatherings with Friends and Family: More than three times a week  . Attends Religious Services: Never  . Active Member of Clubs or Organizations: Yes  . Attends Archivist Meetings: More than 4 times per year  . Marital Status: Married  Human resources officer Violence: Not At Risk  . Fear of Current or Ex-Partner: No  . Emotionally Abused: No  . Physically Abused: No  . Sexually Abused: No    Medications:   Current Outpatient Medications on File Prior to Visit  Medication Sig Dispense Refill  . diclofenac Sodium (VOLTAREN) 1 % GEL Apply topically 4 (four) times daily as needed.    . Lacosamide (VIMPAT) 100 MG TABS Take 1 tablet (100 mg total) by mouth 2 (two) times daily. 180 tablet 1  . Multiple Vitamins-Iron (MULTIVITAMINS WITH IRON) TABS tablet Take 1 tablet by mouth daily.    . rosuvastatin (CRESTOR) 10 MG tablet TAKE ONE TABLET BY MOUTH DAILY 30 tablet 5   No current facility-administered medications on file prior to visit.    Allergies:  No Known Allergies  Physical Exam  Today's Vitals   01/06/20 0837  BP: 137/75  Pulse: 60  Weight: 197 lb (89.4 kg)  Height: 5\' 11"  (1.803 m)   Body mass index is 27.48 kg/m.  General: well developed, well nourished middle-aged Caucasian male, seated, in no evident distress Head: head normocephalic and atraumatic.   Neck: supple with no carotid or supraclavicular bruits Cardiovascular: regular rate and rhythm, no murmurs Musculoskeletal: no deformity. Old craniotomy scar left frontal Skin:  no rash/petichiae Vascular:  Normal pulses all extremities  Neurologic Exam Mental Status: Awake and fully alert. Oriented to place and time. Recent and remote memory intact. Attention span, concentration and fund of knowledge appropriate. Mood and affect appropriate.  Cranial  Nerves: Pupils equal, briskly reactive to light. Extraocular movements full without nystagmus. Visual fields full to confrontation. Hearing intact. Facial sensation intact. Face, tongue, palate moves normally and symmetrically.  Motor: Normal bulk and tone. Normal strength in all tested extremity muscles.  Sensory.: intact to touch , pinprick , position and vibratory sensation.  Coordination: Rapid alternating movements normal in all extremities. Finger-to-nose and heel-to-shin performed accurately bilaterally. Gait and Station: Arises from chair without difficulty. Stance is normal. Gait demonstrates normal stride length and balance . Able to heel, toe and tandem walk without difficulty.  Reflexes: 1+ and symmetric. Toes downgoing.      ASSESSMENT: 69 year male who is being seen today for follow-up regarding likely seizure-like episodes consisting of brief loss of consciousness as well as brief episodes of aura possibly complex partial seizures with and without secondary generalization.  Remote history of subarachnoid hemorrhage status post surgical aneurysm clipping  In 1991and seizures may be symptomatic late effect  from this.  Initially placed on Keppra with complaints of worsening symptoms and unable to tolerate Lamictal therefore currently on Vimpat tolerating well without recurrent seizure activity.      PLAN: -Continuation of Vimpat 100 mg twice daily for seizure prevention.  Refill provided -Discussed avoidance of seizure triggers and to call office with any seizure events or symptoms     Follow-up in 6 months or call earlier if needed   CC:  [place] provider: Dr. Kathleen Lime, Tommi Rumps, NP    I spent 20 minutes of face-to-face and non-face-to-face time with patient.  This included previsit chart review, lab review, study review, order entry, electronic health record documentation, patient education and discussion regarding history of seizures, ongoing use of Vimpat, seizure triggers and  answered all questions to patient satisfaction   Frann Rider, [facility]  [facility] [address] [place], [place] [address]  Phone [phone] Fax [phone] Note: This document was prepared with digital dictation and possible smart phrase technology. Any transcriptional errors that result from this process are unintentional.

## 2020-01-13 DIAGNOSIS — D485 Neoplasm of uncertain behavior of skin: Secondary | ICD-10-CM | POA: Diagnosis not present

## 2020-01-13 DIAGNOSIS — L578 Other skin changes due to chronic exposure to nonionizing radiation: Secondary | ICD-10-CM | POA: Diagnosis not present

## 2020-01-13 DIAGNOSIS — Z85828 Personal history of other malignant neoplasm of skin: Secondary | ICD-10-CM | POA: Diagnosis not present

## 2020-01-13 DIAGNOSIS — L84 Corns and callosities: Secondary | ICD-10-CM | POA: Diagnosis not present

## 2020-01-13 DIAGNOSIS — C44719 Basal cell carcinoma of skin of left lower limb, including hip: Secondary | ICD-10-CM | POA: Diagnosis not present

## 2020-01-13 DIAGNOSIS — L570 Actinic keratosis: Secondary | ICD-10-CM | POA: Diagnosis not present

## 2020-01-13 DIAGNOSIS — L821 Other seborrheic keratosis: Secondary | ICD-10-CM | POA: Diagnosis not present

## 2020-01-13 DIAGNOSIS — L812 Freckles: Secondary | ICD-10-CM | POA: Diagnosis not present

## 2020-02-04 ENCOUNTER — Encounter: Payer: Self-pay | Admitting: Adult Health

## 2020-02-05 ENCOUNTER — Other Ambulatory Visit: Payer: Self-pay | Admitting: Adult Health

## 2020-02-05 MED ORDER — DICLOFENAC SODIUM 1 % EX GEL: 2.0000 g | Freq: Four times a day (QID) | CUTANEOUS | 6 refills | Status: AC | PRN

## 2020-04-25 ENCOUNTER — Other Ambulatory Visit: Payer: Self-pay | Admitting: Cardiovascular Disease

## 2020-05-19 ENCOUNTER — Encounter (INDEPENDENT_AMBULATORY_CARE_PROVIDER_SITE_OTHER): Payer: Self-pay

## 2020-05-19 DIAGNOSIS — H04123 Dry eye syndrome of bilateral lacrimal glands: Secondary | ICD-10-CM | POA: Diagnosis not present

## 2020-05-19 DIAGNOSIS — D3131 Benign neoplasm of right choroid: Secondary | ICD-10-CM | POA: Diagnosis not present

## 2020-05-19 DIAGNOSIS — H2513 Age-related nuclear cataract, bilateral: Secondary | ICD-10-CM | POA: Diagnosis not present

## 2020-06-01 ENCOUNTER — Other Ambulatory Visit: Payer: Self-pay

## 2020-06-02 ENCOUNTER — Encounter: Payer: Self-pay | Admitting: Adult Health

## 2020-06-02 ENCOUNTER — Ambulatory Visit (INDEPENDENT_AMBULATORY_CARE_PROVIDER_SITE_OTHER): Payer: Medicare Other | Admitting: Adult Health

## 2020-06-02 ENCOUNTER — Ambulatory Visit (INDEPENDENT_AMBULATORY_CARE_PROVIDER_SITE_OTHER): Payer: Medicare Other

## 2020-06-02 VITALS — BP 120/60 | HR 67 | Temp 98.3°F | Ht 71.0 in | Wt 195.8 lb

## 2020-06-02 DIAGNOSIS — M545 Low back pain, unspecified: Secondary | ICD-10-CM

## 2020-06-02 MED ORDER — METHYLPREDNISOLONE 4 MG PO TBPK: ORAL_TABLET | ORAL | 0 refills | Status: DC

## 2020-06-02 MED ORDER — CYCLOBENZAPRINE HCL 10 MG PO TABS: 10.0000 mg | ORAL_TABLET | Freq: Three times a day (TID) | ORAL | 0 refills | Status: DC | PRN

## 2020-06-02 NOTE — Progress Notes (Signed)
Subjective:    Patient ID: James Myers, male    DOB: [birth year], 70 y.o.   MRN: [id number]  HPI 70 year old male who  has a past medical history of Arthritis, Brain aneurysm, Chicken pox, GERD (gastroesophageal reflux disease), Melanoma ([place]), and Stroke ([place]).   He presents to the office today for an acute issue of left sided low back pain.  Per patient report he was lifting up his riding lawnmower about 4 days ago and when doing so felt a "popping sensation" in his left lower back.  Reports that the pain is worse with change in positions at which time his pain goes from a 1 to a 9 and feels as though it is a "cutting sensation".  He has been using Salonpas and Voltaren gel with some relief has noticed that when riding in his car that has heated seats he does notice improvement when the heat is on.  Denies issues with bowel or bladder.  Review of Systems See HPI   Past Medical History:  Diagnosis Date  . Arthritis   . Brain aneurysm   . Chicken pox   . GERD (gastroesophageal reflux disease)   . Melanoma ([place])   . Stroke [facility])     Social History   Socioeconomic History  . Marital status: Married    Spouse name: Not on file  . Number of children: 1  . Years of education: 18  . Highest education level: Not on file  Occupational History  . 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 Smoking status: Never Smoker  . Smokeless tobacco: Never Used  Substance and Sexual Activity  . Alcohol use: Yes    Comment: 4 oz daily  . Drug use: No  . Sexual activity: Yes  Other Topics Concern  . Not on file  Social History Narrative   Born and raised in [place]. Currently resided in a private residence with his wife. Denies pets.    Fun: Golf and likes to swim / yard-work   Denies religious beliefs effecting healthcare.   One Child    Two grandchildren       Social Determinants of Health   Financial Resource Strain: Low Risk   . Difficulty of Paying  Living Expenses: Not hard at all  Food Insecurity: No Food Insecurity  . Worried About Charity fundraiser in the Last Year: Never true  . Ran Out of Food in the Last Year: Never true  Transportation Needs: No Transportation Needs  . Lack of Transportation (Medical): No  . Lack of Transportation (Non-Medical): No  Physical Activity: Sufficiently Active  . Days of Exercise per Week: 4 days  . Minutes of Exercise per Session: 60 min  Stress: No Stress Concern Present  . Feeling of Stress : Not at all  Social Connections: Moderately Integrated  . Frequency of Communication with Friends and Family: More than three times a week  . Frequency of Social Gatherings with Friends and Family: More than three times a week  . Attends Religious Services: Never  . Active Member of Clubs or Organizations: Yes  . Attends Archivist Meetings: More than 4 times per year  . Marital Status: Married  Human resources officer Violence: Not At Risk  . Fear of Current or Ex-Partner: No  . Emotionally Abused: No  . Physically Abused: No  . Sexually Abused: No    Past Surgical History:  Procedure Laterality Date  .  BRAIN SURGERY  1991  . TONSILLECTOMY AND ADENOIDECTOMY      Family History  Problem Relation Age of Onset  . Aneurysm Mother        brain   . Arthritis Mother   . Parkinson's disease Mother   . Stroke Father   . Arthritis Father   . Syncope episode Father   . Arthritis Maternal Grandmother   . Diabetes Maternal Grandfather   . Arthritis Paternal Grandmother   . Tremor Brother     No Known Allergies  Current Outpatient Medications on File Prior to Visit  Medication Sig Dispense Refill  . diclofenac Sodium (VOLTAREN) 1 % GEL Apply 2 g topically 4 (four) times daily as needed. Apply topically 4 (four) times daily as needed. 150 g 6  . Lacosamide (VIMPAT) 100 MG TABS Take 1 tablet (100 mg total) by mouth 2 (two) times daily. 180 tablet 1  . Multiple Vitamins-Iron (MULTIVITAMINS WITH  IRON) TABS tablet Take 1 tablet by mouth daily.    . rosuvastatin (CRESTOR) 10 MG tablet TAKE ONE TABLET BY MOUTH DAILY 90 tablet 3   No current facility-administered medications on file prior to visit.    BP 120/60 (BP Location: Left Arm, Patient Position: Sitting, Cuff Size: Normal)   Pulse 67   Temp 98.3 F (36.8 C) (Oral)   Ht 5\' 11"  (1.803 m)   Wt 195 lb 12.8 oz (88.8 kg)   SpO2 95%   BMI 27.31 kg/m       Objective:   Physical Exam Vitals and nursing note reviewed.  Constitutional:      Appearance: Normal appearance.  Pulmonary:     Effort: Pulmonary effort is normal.  Musculoskeletal:        General: Tenderness present.     Lumbar back: Tenderness and bony tenderness present. Decreased range of motion.       Back:     Comments: Has discomfort with palpation along lower lumbar paraspinal muscles on the left side and has mild discomfort with palpation along lumbar spine itself.  Skin:    General: Skin is warm and dry.  Neurological:     General: No focal deficit present.     Mental Status: He is alert and oriented to person, place, and time.  Psychiatric:        Mood and Affect: Mood normal.        Behavior: Behavior normal.        Thought Content: Thought content normal.        Judgment: Judgment normal.       Assessment & Plan:  1. Acute left-sided low back pain without sciatica - likely muscle strain from lifting heavy object.  -We will trial Flexeril and Medrol Dosepak.  Advised he can take Motrin over the next few days and to use a heating pad as well.  He will follow-up in the next couple of days if symptoms are not improving.  May need further imaging - DG Lumbar Spine Complete; Future - cyclobenzaprine (FLEXERIL) 10 MG tablet; Take 1 tablet (10 mg total) by mouth 3 (three) times daily as needed for muscle spasms.  Dispense: 30 tablet; Refill: 0 - methylPREDNISolone (MEDROL DOSEPAK) 4 MG TBPK tablet; Take as directed  Dispense: 21 tablet; Refill: 0  Dorothyann Peng, NP

## 2020-06-06 ENCOUNTER — Encounter: Payer: Self-pay | Admitting: Adult Health

## 2020-06-29 ENCOUNTER — Telehealth: Payer: Self-pay | Admitting: Adult Health

## 2020-06-29 NOTE — Telephone Encounter (Signed)
R/s vv due to [place] np out of office.

## 2020-07-04 ENCOUNTER — Other Ambulatory Visit: Payer: Self-pay

## 2020-07-04 ENCOUNTER — Encounter (INDEPENDENT_AMBULATORY_CARE_PROVIDER_SITE_OTHER): Payer: Self-pay | Admitting: Ophthalmology

## 2020-07-04 ENCOUNTER — Ambulatory Visit (INDEPENDENT_AMBULATORY_CARE_PROVIDER_SITE_OTHER): Payer: Medicare Other | Admitting: Ophthalmology

## 2020-07-04 DIAGNOSIS — D3131 Benign neoplasm of right choroid: Secondary | ICD-10-CM | POA: Diagnosis not present

## 2020-07-04 DIAGNOSIS — H2513 Age-related nuclear cataract, bilateral: Secondary | ICD-10-CM | POA: Insufficient documentation

## 2020-07-04 NOTE — Progress Notes (Signed)
07/04/2020     CHIEF COMPLAINT Patient presents for Retina Evaluation (NP Choroidal Nevus OD - Ref'd by Dr. Nicki Reaper [facility]//Pt denies any noticeable visual symptoms OU. Pt sts he thinks his last "eye doctor saw something in the back of his eye 10 years ago, but I'm not sure." No symptoms OU.)   HISTORY OF PRESENT ILLNESS: James Myers is a 70 y.o. male who presents to the clinic today for:   HPI    Retina Evaluation    Laterality: right eye   Treatments tried: no treatments   Comments: NP Choroidal Nevus OD - Ref'd by Dr. Wyatt Portela  Pt denies any noticeable visual symptoms OU. Pt sts he thinks his last "eye doctor saw something in the back of his eye 10 years ago, but I'm not sure." No symptoms OU.       Last edited by James Myers, James Myers on 07/04/2020  8:51 AM. (History)      Referring physician: Dorothyann Peng, NP [place] [place],  [place] [address]  HISTORICAL INFORMATION:   Selected notes from the [facility]: No current outpatient medications on file. (Ophthalmic Drugs)   No current facility-administered medications for this visit. (Ophthalmic Drugs)   Current Outpatient Medications (Other)  Medication Sig  . cyclobenzaprine (FLEXERIL) 10 MG tablet Take 1 tablet (10 mg total) by mouth 3 (three) times daily as needed for muscle spasms.  . diclofenac Sodium (VOLTAREN) 1 % GEL Apply 2 g topically 4 (four) times daily as needed. Apply topically 4 (four) times daily as needed.  . Lacosamide (VIMPAT) 100 MG TABS Take 1 tablet (100 mg total) by mouth 2 (two) times daily.  . methylPREDNISolone (MEDROL DOSEPAK) 4 MG TBPK tablet Take as directed  . Multiple Vitamins-Iron (MULTIVITAMINS WITH IRON) TABS tablet Take 1 tablet by mouth daily.  . rosuvastatin (CRESTOR) 10 MG tablet TAKE ONE TABLET BY MOUTH DAILY   No current facility-administered medications for this visit. (Other)      REVIEW OF SYSTEMS:    ALLERGIES No Known  Allergies  PAST MEDICAL HISTORY Past Medical History:  Diagnosis Date  . Arthritis   . Brain aneurysm   . Chicken pox   . GERD (gastroesophageal reflux disease)   . Melanoma ([place])   . Stroke [facility])    Past Surgical History:  Procedure Laterality Date  . BRAIN SURGERY  1991  . TONSILLECTOMY AND ADENOIDECTOMY      FAMILY HISTORY Family History  Problem Relation Age of Onset  . Aneurysm Mother        brain   . Arthritis Mother   . Parkinson's disease Mother   . Stroke Father   . Arthritis Father   . Syncope episode Father   . Arthritis Maternal Grandmother   . Diabetes Maternal Grandfather   . Arthritis Paternal Grandmother   . Tremor Brother     SOCIAL HISTORY Social History   Tobacco Use  . Smoking status: Never Smoker  . Smokeless tobacco: Never Used  Substance Use Topics  . Alcohol use: Yes    Comment: 4 oz daily  . Drug use: No         OPHTHALMIC EXAM: Base Eye Exam    Visual Acuity (ETDRS)      Right Left   Dist [place] 20/20 20/20 -2       Tonometry (Tonopen, 8:55 AM)      Right Left   Pressure 15 16  Pupils      Pupils Dark Light Shape React APD   Right PERRL 5 4 Round Slow None   Left PERRL 5 4 Round Slow None       Visual Fields (Counting fingers)      Left Right    Full Full       Extraocular Movement      Right Left    Full Full       Neuro/Psych    Oriented x3: Yes   Mood/Affect: Normal       Dilation    Both eyes: 1.0% Mydriacyl, 2.5% Phenylephrine @ 8:55 AM        Slit Lamp and Fundus Exam    External Exam      Right Left   External Normal Normal       Slit Lamp Exam      Right Left   Lids/Lashes Normal Normal   Conjunctiva/Sclera White and quiet White and quiet   Cornea Clear Clear   Anterior Chamber Deep and quiet Deep and quiet   Iris Round and reactive Round and reactive   Lens 2+ Nuclear sclerosis 2+ Nuclear sclerosis   Anterior Vitreous Normal Normal       Fundus Exam      Right Left   Posterior  Vitreous Posterior vitreous detachment Posterior vitreous detachment   Disc Normal Normal   C/D Ratio 0.25 0.25   Macula Normal Normal   Vessels Normal Normal   Periphery Choroidal nevus, small, 1.5 dd , regular pigment, flat, no subretinal fluid, no lipofuscin, no atrophy.  No high risk features. Normal          IMAGING AND PROCEDURES  Imaging and Procedures for 07/04/20  Color Fundus Photography Optos - OU - Both Eyes       Right Eye Progression has no prior data. Disc findings include normal observations. Macula : normal observations. Vessels : normal observations.   Left Eye Progression has no prior data. Disc findings include normal observations. Macula : normal observations. Vessels : normal observations. Periphery : normal observations.   Notes Choroidal nevus along the inferotemporal arcade just posterior to the equator.  1.25 horizontal dimension, 1.5-1.65 in the vertical dimension, flat, low risk, white drusen, no fluid, no lipofuscin, no atrophy  Follow-up 6 months       OCT, Retina - OU - Both Eyes       Right Eye Quality was good. Scan locations included subfoveal. Central Foveal Thickness: 294. Progression has no prior data. Findings include normal foveal contour.   Left Eye Quality was good. Scan locations included subfoveal. Central Foveal Thickness: 308. Progression has no prior data. Findings include normal foveal contour.   Notes No active maculopathy OU.                ASSESSMENT/PLAN:  Choroidal nevus of right eye Small choroidal nevus inferotemporally, just posterior to the equator.  No high risk features.  This small area needs to be followed and carefully for the next 6 months follow-up with that likely thereafter annual examinations  Age-related nuclear cataract of both eyes The nature of cataract was discussed with the patient as well as the elective nature of surgery. The patient was reassured that surgery at a later date does not  put the patient at risk for a worse outcome. It was emphasized that the need for surgery is dictated by the patient's quality of life as influenced by the cataract. Patient was instructed to maintain  close follow up with their general eye care doctor.  Follow-up with [facility] as scheduled      ICD-10-CM   1. Choroidal nevus of right eye  D31.31 Color Fundus Photography Optos - OU - Both Eyes    OCT, Retina - OU - Both Eyes  2. Age-related nuclear cataract of both eyes  H25.13     1.  Choroidal nevus with no high risk features, small size, not likely to change over time.  2.  Follow-up once in 6 months and likely yearly thereafter  3.  Ophthalmic Meds Ordered this visit:  No orders of the defined types were placed in this encounter.      Return in about 6 months (around 01/03/2021) for dilate, OD, COLOR FP.  There are no Patient Instructions on file for this visit.   Explained the diagnoses, plan, and follow up with the patient and they expressed understanding.  Patient expressed understanding of the importance of proper follow up care.   Clent Demark Nabria Nevin M.D. Diseases & Surgery of the Retina and Vitreous Retina & Diabetic [place] 07/04/20     Abbreviations: M myopia (nearsighted); A astigmatism; H hyperopia (farsighted); P presbyopia; Mrx spectacle prescription;  CTL contact lenses; OD right eye; OS left eye; OU both eyes  XT exotropia; ET esotropia; PEK punctate epithelial keratitis; PEE punctate epithelial erosions; DES dry eye syndrome; MGD meibomian gland dysfunction; ATs artificial tears; PFAT's preservative free artificial tears; [place] nuclear sclerotic cataract; PSC posterior subcapsular cataract; ERM epi-retinal membrane; PVD posterior vitreous detachment; RD retinal detachment; DM diabetes mellitus; DR diabetic retinopathy; NPDR non-proliferative diabetic retinopathy; PDR proliferative diabetic retinopathy; CSME clinically significant macular edema; DME diabetic  macular edema; dbh dot blot hemorrhages; CWS cotton wool spot; POAG primary open angle glaucoma; C/D cup-to-disc ratio; HVF humphrey visual field; GVF goldmann visual field; OCT optical coherence tomography; IOP intraocular pressure; BRVO Branch retinal vein occlusion; CRVO central retinal vein occlusion; CRAO central retinal artery occlusion; BRAO branch retinal artery occlusion; RT retinal tear; SB scleral buckle; PPV pars plana vitrectomy; VH Vitreous hemorrhage; PRP panretinal laser photocoagulation; IVK intravitreal kenalog; VMT vitreomacular traction; MH Macular hole;  NVD neovascularization of the disc; NVE neovascularization elsewhere; AREDS age related eye disease study; ARMD age related macular degeneration; POAG primary open angle glaucoma; EBMD epithelial/anterior basement membrane dystrophy; ACIOL anterior chamber intraocular lens; IOL intraocular lens; PCIOL posterior chamber intraocular lens; Phaco/IOL phacoemulsification with intraocular lens placement; [place] photorefractive keratectomy; LASIK laser assisted in situ keratomileusis; HTN hypertension; DM diabetes mellitus; COPD chronic obstructive pulmonary disease

## 2020-07-04 NOTE — Assessment & Plan Note (Signed)
Small choroidal nevus inferotemporally, just posterior to the equator.  No high risk features.  This small area needs to be followed and carefully for the next 6 months follow-up with that likely thereafter annual examinations

## 2020-07-04 NOTE — Assessment & Plan Note (Signed)
The nature of cataract was discussed with the patient as well as the elective nature of surgery. The patient was reassured that surgery at a later date does not put the patient at risk for a worse outcome. It was emphasized that the need for surgery is dictated by the patient's quality of life as influenced by the cataract. Patient was instructed to maintain close follow up with their general eye care doctor.  Follow-up with [facility] as scheduled

## 2020-07-06 ENCOUNTER — Telehealth: Payer: Medicare Other | Admitting: Adult Health

## 2020-07-18 ENCOUNTER — Telehealth (INDEPENDENT_AMBULATORY_CARE_PROVIDER_SITE_OTHER): Payer: Medicare Other | Admitting: Adult Health

## 2020-07-18 ENCOUNTER — Encounter: Payer: Self-pay | Admitting: Adult Health

## 2020-07-18 DIAGNOSIS — G40209 Localization-related (focal) (partial) symptomatic epilepsy and epileptic syndromes with complex partial seizures, not intractable, without status epilepticus: Secondary | ICD-10-CM

## 2020-07-18 MED ORDER — LACOSAMIDE 100 MG PO TABS: 100.0000 mg | ORAL_TABLET | Freq: Two times a day (BID) | ORAL | 2 refills | Status: DC

## 2020-07-18 NOTE — Progress Notes (Signed)
[facility] [address] [place] [address] [phone]       OFFICE FOLLOW UP NOTE  James. James Myers Date of Birth:  11-02-1950 Medical Record Number:  [id number]   Referring MD: Shelva Majestic Reason for visit: Seizures   Virtual Visit via Video Note  Virtual visit completed through [place], a video enabled telemedicine application. Due to national recommendations of social distancing due to COVID-19, a virtual visit is felt to be most appropriate for this patient at this time. Reviewed limitations, risks, security and privacy concerns of performing a virtual visit and the availability of in person appointments. I also reviewed that there may be a patient responsible charge related to this service. The patient agreed to proceed.    Patient location: home Provider location: in office, [facility] Persons participating in this virtual visit: patient      HPI:   Today, 07/18/2020, James Myers is being seen via video visit for 6 months seizure follow-up unaccompanied.  Reports he has been doing well since prior visit without any additional seizure events or symptoms.  Compliant on Vimpat 100 mg twice daily tolerating without side effects. He does refrain from ETOH use since his seizures.  He does report having some difficulties with arthritis of lumbar region but has been gradually improving and currently using OTC omega-3, turmeric and Voltaren gel with benefit.  No new concerns at this time.   History provided for reference purposes only Update 01/06/2020 JM: James Myers returns for 43-month seizure follow-up.  Remains on Vimpat 100 mg twice daily without side effects and denies any reoccurring seizure events or symptoms.  Maintains ADLs and IADLs independently as well as driving without difficulty.  No concerns at this time.  Update 07/07/2019: Returns for seizure follow-up.  He has been doing well since prior visit 6 months ago without  recurrent seizure activity remains on Vimpat 100 mg twice daily tolerating well. He does report cessation of all alcohol.  Blood pressure today 135/74.  He questions safety of arthritic pain medication such as naproxen in regards to seizures and use of Vimpat and possible interaction. He does use voltaren gel and aspercream as needed at night only. No other concerns at this time.   Initial visit 12/02/2017 ;James Myers is a pleasant 70 year old Caucasian male who has had multiple episodes of brief loss of consciousness since April 2017.  History is obtained from the patient and review of electronic medical records.  I personally reviewed imaging films.  He actually states that the few months prior to his first episode of passing out he used to have brief episodes of particular smell or taste and he would manage to control it and prevent it from getting worse.  The first episode he passed out while he was brushing teeth he had a feeling that something were to happen before he could control it he fell down got up quickly he was out for only few seconds.  Second episode occurred when he was outside all of a sudden his wife who was standing behind him noticed that he was leaning to one side and started snorting when his wife began to wake him up he started jerking his arms and then woke up and was quite disoriented for a few minutes.  The third episode he fell in the kitchen floor on the hardwood floor and woke up in a minute.  He is felt he did not have a warning.  The wife also reports other episodes  like a staring episode she weakness in the gym about 4 weeks ago when the patient was staring and was briefly unresponsive and not talking within 30 seconds he started speaking.  Patient has no prior history of known seizures significant head injury with loss of consciousness or intracranial issues except that he had a history of ruptured subarachnoid hemorrhage in 1991 for which he underwent craniotomy and surgical  clipping in [place].  He apparently did quite well from that and had no significant physical deficits or cognitive issues.  He has been seen by cardiology who have ordered an echocardiogram which was unremarkable.  Carotid ultrasound done on 11/06/2017 showed no significant extracranial stenosis.  CT scan of the head on 10/28/2017 showed old left craniotomy defect and aneurysm clip on the left side anteriorly.  No acute abnormality.  Patient has not had an EEG.  He has not followed up with a neurologist.   It is unclear if he can have an MRI because his aneurysm clip was in 1991 and I am not sure it is MRI compatible  Update 02/11/2018 ; he returns for follow-up after last visit 2 months ago.  He states he has had no further episodes of aura or passing out or seizure-like episodes.  He is still on Keppra XR 500 mg daily and states that he felt tired and hence did not feel like he needed to increase the dose further.  He complains of occasional positional dizziness when he bends down.  He is otherwise doing well without any new symptoms.  He had EEG done on 12/31/2017 which I personally reviewed and was normal.  He had CT angiogram of the brain done on 12/11/2017 which also have reviewed shows no new aneurysms.  The previous surgical the clipped aneurysm is noted in the left cavernous distal carotid.  Patient has no new complaints today.  Update 08/19/2018: James Myers is a 70 year old male who is being seen today for follow-up regarding seizures.  He reports approximately 6 times daily experiences sensation of nausea, increased elevation, balance difficulties and fatigue which lasts for approximately 1 minute.  Denies loss of consciousness, dizziness, SOB or palpitations.  Unable to associate activity with the symptoms as he can be sitting still while he experiences these.  Prior seizure episodes consist of auras and passing out but he feels as though his most recent symptoms are different from his possible  seizure-like episodes.  These have been present for approximately 1 month.  He does endorse increasing Keppra XR dose as recommended at prior visit from 500 mg daily to 500 mg twice daily approximately 1 month ago.  He denies any recurrent seizure-like episodes.  Blood pressure stable 127/72.  Heart rate 52.  He does not check vitals during or after these episodes.  Update 12/31/2018: James Myers is a 70 year old male who is being seen today for seizure follow-up.  Due to possible continued seizure activity, Keppra dosage increased after prior visit.  Initially doing well with increasing dose but then started experiencing more frequent episodes as described above therefore recommend transitioning to Lamictal.  He unfortunately experienced frequent episodes of diarrhea therefore transitioned to Vimpat.  He is currently on 100 mg twice daily which he has been tolerating well without any side effects or reoccurring seizure or seizure type activity.  He does report yesterday having a 30 second episode of vertigo but has not experienced vertigo symptoms since and denies any recent vertigo prior to yesterday.  He did check blood pressure  at that time and was elevated for him at 133/25.  Blood pressure at today's visit 118/64.  He does report recent weight loss with daily exercise and eating healthy.  No further concerns at this time.       ROS:   14 system review of systems is positive for those listed in HPI and all other systems negative  PMH:  Past Medical History:  Diagnosis Date   Arthritis    Brain aneurysm    Chicken pox    GERD (gastroesophageal reflux disease)    Melanoma (HCC)    Stroke (HCC)     Social History:  Social History   Socioeconomic History   Marital status: Married    Spouse name: Not on file   Number of children: 1   Years of education: 16   Highest education level: Not on file  Occupational History   Occupation: Retired     Comment: Geophysicist/field seismologist for [employer]  Use   Smoking status: Never   Smokeless tobacco: Never  Substance and Sexual Activity   Alcohol use: Yes    Comment: 4 oz daily   Drug use: No   Sexual activity: Yes  Other Topics Concern   Not on file  Social History Narrative   Born and raised in [place]. Currently resided in a private residence with his wife. Denies pets.    Fun: Golf and likes to swim / yard-work   Denies religious beliefs effecting healthcare.   One Child    Two grandchildren       Social Determinants of Radio broadcast assistant Strain: Low Risk    Difficulty of Paying Living Expenses: Not hard at all  Food Insecurity: No Food Insecurity   Worried About Charity fundraiser in the Last Year: Never true   Arboriculturist in the Last Year: Never true  Transportation Needs: No Transportation Needs   Lack of Transportation (Medical): No   Lack of Transportation (Non-Medical): No  Physical Activity: Sufficiently Active   Days of Exercise per Week: 4 days   Minutes of Exercise per Session: 60 min  Stress: No Stress Concern Present   Feeling of Stress : Not at all  Social Connections: Moderately Integrated   Frequency of Communication with Friends and Family: More than three times a week   Frequency of Social Gatherings with Friends and Family: More than three times a week   Attends Religious Services: Never   Marine scientist or Organizations: Yes   Attends Music therapist: More than 4 times per year   Marital Status: Married  Human resources officer Violence: Not At Risk   Fear of Current or Ex-Partner: No   Emotionally Abused: No   Physically Abused: No   Sexually Abused: No    Medications:   Current Outpatient Medications on File Prior to Visit  Medication Sig Dispense Refill   TURMERIC PO Take by mouth.     diclofenac Sodium (VOLTAREN) 1 % GEL Apply 2 g topically 4 (four) times daily as needed. Apply topically 4 (four) times daily as needed. 150 g 6   Multiple Vitamins-Iron  (MULTIVITAMINS WITH IRON) TABS tablet Take 1 tablet by mouth daily.     rosuvastatin (CRESTOR) 10 MG tablet TAKE ONE TABLET BY MOUTH DAILY 90 tablet 3   No current facility-administered medications on file prior to visit.    Allergies:  No Known Allergies  Physical Exam  General: well developed, well nourished,  very pleasant elderly Caucasian male, seated, in no evident distress Head: head normocephalic and atraumatic.    Neurologic Exam Mental Status: Awake and fully alert.  Fluent speech and language.  Oriented to place and time. Recent and remote memory intact. Attention span, concentration and fund of knowledge appropriate. Mood and affect appropriate.  Cranial Nerves: Extraocular movements full without nystagmus. Hearing intact to voice. Facial sensation intact. Face, tongue, palate moves normally and symmetrically.  Shoulder shrug symmetric. Motor: No evidence of weakness per drift assessment Coordination: Rapid alternating movements normal in all extremities. Finger-to-nose and heel-to-shin performed accurately bilaterally.        ASSESSMENT: 70 year male with seizure-like episodes since 2019 consisting of brief loss of consciousness as well as brief episodes of aura possibly complex partial seizures with and without secondary generalization.  Remote history of subarachnoid hemorrhage status post surgical aneurysm clipping in 1991and seizures may be symptomatic late effect  from this.  Initially placed on Keppra with complaints of worsening symptoms and unable to tolerate Lamictal therefore currently on Vimpat tolerating well without recurrent seizure activity.      PLAN: -Continuation of Vimpat 100 mg twice daily for seizure prevention.  Refill provided -Discussed avoidance of seizure triggers and to call office with any seizure events or symptoms -Continue to follow with PCP for lumbar back pain likely in setting of arthritis     Follow-up in 1 year or call earlier if  needed   CC:  Page provider: Dr. Kathleen Lime, Tommi Rumps, NP    I spent 22 minutes of face-to-face and non-face-to-face time with patient via [place] video visit.  This included previsit chart review, order entry, electronic health record documentation, and patient education and discussion regarding history of seizures, ongoing use of Vimpat, risk vs benefit of continuing AED vs tapering off, seizure triggers and importance of avoidance, acute on chronic lower back pain and answered all other questions to patient satisfaction  Frann Rider, AGNP-BC  [facility] [address] [place], [place] [address]  Phone [phone] Fax [phone] Note: This document was prepared with digital dictation and possible smart phrase technology. Any transcriptional errors that result from this process are unintentional.

## 2020-07-20 DIAGNOSIS — L570 Actinic keratosis: Secondary | ICD-10-CM | POA: Diagnosis not present

## 2020-07-20 DIAGNOSIS — Z85828 Personal history of other malignant neoplasm of skin: Secondary | ICD-10-CM | POA: Diagnosis not present

## 2020-07-20 DIAGNOSIS — C44519 Basal cell carcinoma of skin of other part of trunk: Secondary | ICD-10-CM | POA: Diagnosis not present

## 2020-07-20 DIAGNOSIS — L309 Dermatitis, unspecified: Secondary | ICD-10-CM | POA: Diagnosis not present

## 2020-07-20 DIAGNOSIS — L821 Other seborrheic keratosis: Secondary | ICD-10-CM | POA: Diagnosis not present

## 2020-07-20 DIAGNOSIS — D485 Neoplasm of uncertain behavior of skin: Secondary | ICD-10-CM | POA: Diagnosis not present

## 2020-07-29 NOTE — Progress Notes (Signed)
I agree with the above plan 

## 2020-09-07 ENCOUNTER — Encounter: Payer: Self-pay | Admitting: Adult Health

## 2020-09-07 NOTE — Telephone Encounter (Signed)
Can pt go back to brand ?

## 2020-09-08 ENCOUNTER — Other Ambulatory Visit: Payer: Self-pay | Admitting: *Deleted

## 2020-09-08 DIAGNOSIS — G40209 Localization-related (focal) (partial) symptomatic epilepsy and epileptic syndromes with complex partial seizures, not intractable, without status epilepticus: Secondary | ICD-10-CM

## 2020-09-08 MED ORDER — VIMPAT 100 MG PO TABS: 100.0000 mg | ORAL_TABLET | Freq: Two times a day (BID) | ORAL | 1 refills | Status: DC

## 2020-09-08 NOTE — Progress Notes (Signed)
Order signed. Thank you!

## 2020-09-08 NOTE — Telephone Encounter (Signed)
Yes this should be the brand Vimpat not the generic form - are you able to correct this? Thank you

## 2020-09-30 ENCOUNTER — Other Ambulatory Visit: Payer: Self-pay

## 2020-09-30 ENCOUNTER — Ambulatory Visit (INDEPENDENT_AMBULATORY_CARE_PROVIDER_SITE_OTHER): Payer: Medicare Other | Admitting: Adult Health

## 2020-09-30 ENCOUNTER — Encounter: Payer: Self-pay | Admitting: Adult Health

## 2020-09-30 VITALS — BP 120/70 | HR 64 | Temp 98.2°F | Ht 71.0 in | Wt 196.6 lb

## 2020-09-30 DIAGNOSIS — T63481A Toxic effect of venom of other arthropod, accidental (unintentional), initial encounter: Secondary | ICD-10-CM

## 2020-09-30 MED ORDER — METHYLPREDNISOLONE ACETATE 80 MG/ML IJ SUSP
80.0000 mg | Freq: Once | INTRAMUSCULAR | Status: AC
  Administered 2020-09-30: 80 mg via INTRAMUSCULAR

## 2020-09-30 MED ORDER — PREDNISONE 20 MG PO TABS: 20.0000 mg | ORAL_TABLET | Freq: Every day | ORAL | 0 refills | Status: DC

## 2020-09-30 NOTE — Patient Instructions (Signed)
It was great seeing you and I am sorry this happened to you   We gave you a steroid injection in the office today   I have also sent in 5 days of prednisone that you will start tomorrow morning   Tonight take 50 mg of Benadryl ( 2 tabs) and tomorrow morning take 1 tab if the swelling is still there  If swelling gets worse over the next 48 hours then go to Urgent Care or the ER

## 2020-09-30 NOTE — Progress Notes (Signed)
Subjective:    Patient ID: James Myers, male    DOB: [birth year], 70 y.o.   MRN: [place]:[id number]  HPI  70 year old male who  has a past medical history of Arthritis, Brain aneurysm, Chicken pox, GERD (gastroesophageal reflux disease), Melanoma ([place]), and Stroke ([place]).  He presents to the office today for an acute issue. Yesterday he was mowing the yard and got stung by atleast three wasps. Over the last 24 hours he has had significant swelling of the left hand up into the left forearm.   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 Currently resided in a private residence with his wife. Denies pets.    Fun: Golf and likes to swim / yard-work   Denies religious beliefs effecting healthcare.   One Child    Two grandchildren       Social Determinants of Radio broadcast assistant Strain: Low Risk    Difficulty of Paying Living Expenses: Not hard at all  Food Insecurity: No Food Insecurity   Worried About Charity fundraiser in the Last Year: Never true   Arboriculturist in the Last Year: Never true  Transportation Needs: No Transportation Needs   Lack of Transportation (Medical): No   Lack of Transportation (Non-Medical): No  Physical Activity: Sufficiently Active   Days of  Exercise per Week: 4 days   Minutes of Exercise per Session: 60 min  Stress: No Stress Concern Present   Feeling of Stress : Not at all  Social Connections: Moderately Integrated   Frequency of Communication with Friends and Family: More than three times a week   Frequency of Social Gatherings with Friends and Family: More than three times a week   Attends Religious Services: Never   Marine scientist or Organizations: Yes   Attends Music therapist: More than 4 times per year   Marital Status: Married  Human resources officer Violence: Not At Risk   Fear of Current or Ex-Partner: No   Emotionally Abused: No   Physically Abused: No   Sexually Abused: No    Past Surgical History:  Procedure Laterality Date   BRAIN SURGERY  1991   TONSILLECTOMY AND ADENOIDECTOMY      Family History  Problem Relation Age of Onset   Aneurysm Mother        brain    Arthritis Mother    Parkinson's disease Mother    Stroke Father    Arthritis Father    Syncope episode Father    Arthritis Maternal Grandmother  Diabetes Maternal Grandfather    Arthritis Paternal Grandmother    Tremor Brother     No Known Allergies  Current Outpatient Medications on File Prior to Visit  Medication Sig Dispense Refill   diclofenac Sodium (VOLTAREN) 1 % GEL Apply 2 g topically 4 (four) times daily as needed. Apply topically 4 (four) times daily as needed. 150 g 6   Multiple Vitamins-Iron (MULTIVITAMINS WITH IRON) TABS tablet Take 1 tablet by mouth daily.     rosuvastatin (CRESTOR) 10 MG tablet TAKE ONE TABLET BY MOUTH DAILY 90 tablet 3   TURMERIC PO Take by mouth.     VIMPAT 100 MG TABS Take 1 tablet (100 mg total) by mouth in the morning and at bedtime. 180 tablet 1   No current facility-administered medications on file prior to visit.    BP 120/70 (BP Location: Left Arm, Patient Position: Sitting, Cuff Size: Normal)   Pulse 64   Temp 98.2 F (36.8 C) (Oral)   Ht '5\' 11"'$  (1.803 m)   Wt 196 lb  9.6 oz (89.2 kg)   SpO2 97%   BMI 27.42 kg/m       Objective:   Physical Exam Vitals and nursing note reviewed.  Constitutional:      Appearance: Normal appearance.  Cardiovascular:     Rate and Rhythm: Normal rate and regular rhythm.     Pulses: Normal pulses.     Heart sounds: Normal heart sounds.  Pulmonary:     Effort: Pulmonary effort is normal.     Breath sounds: Normal breath sounds.  Musculoskeletal:        General: Swelling present.  Skin:    General: Skin is warm and dry.     Comments: Significant swelling to dorsal and palmer aspects of left hand with swelling up to left forearm. He has localized erythema on the forearm.   Neurological:     Mental Status: He is alert.      Assessment & Plan:  1. Venomous insect bite, accidental or unintentional, initial encounter - 80 mg Depo injection today and then start oral prednisone tomorrow - Can take 50 mg Benadryl tonight and 25 tomorrow morning if swelling continues  - methylPREDNISolone acetate (DEPO-MEDROL) injection 80 mg - predniSONE (DELTASONE) 20 MG tablet; Take 1 tablet (20 mg total) by mouth daily with breakfast.  Dispense: 5 tablet; Refill: 0  Dorothyann Peng, NP

## 2020-11-03 DIAGNOSIS — Z23 Encounter for immunization: Secondary | ICD-10-CM | POA: Diagnosis not present

## 2020-11-08 ENCOUNTER — Encounter: Payer: Self-pay | Admitting: *Deleted

## 2020-11-08 ENCOUNTER — Encounter: Payer: Self-pay | Admitting: Adult Health

## 2020-11-08 ENCOUNTER — Telehealth: Payer: Self-pay | Admitting: *Deleted

## 2020-11-08 NOTE — Telephone Encounter (Signed)
Vimpat brand approved Effective from 11/08/2020 through 11/07/2021. Approval faxed to pharmacy.

## 2020-11-08 NOTE — Telephone Encounter (Signed)
Vimpat brand PA, key: [id number], [id number]. Office notes faxed to [facility].

## 2020-11-08 NOTE — Telephone Encounter (Signed)
Received e mail, documents attached to PA.  Your information has been submitted to [place]. [employer] [place] will review the request and notify you of the determination decision directly, typically within 72 hours of receiving all information.  You will also receive your request decision electronically.   If [employer] has not responded within the specified timeframe or if you have any questions about your PA submission, contact [facility] directly at [phone].

## 2020-11-15 ENCOUNTER — Telehealth: Payer: Self-pay | Admitting: Adult Health

## 2020-11-15 NOTE — Telephone Encounter (Signed)
Left message for patient to call back and schedule Medicare Annual Wellness Visit (AWV) either virtually or in office. Left  my Herbie Drape number [phone]   Last AWV 11/18/19  please schedule at anytime with [facility] Nurse Health Advisor 1 or 2   This should be a 45 minute visit.

## 2020-11-24 ENCOUNTER — Ambulatory Visit (INDEPENDENT_AMBULATORY_CARE_PROVIDER_SITE_OTHER): Payer: Medicare Other | Admitting: Adult Health

## 2020-11-24 ENCOUNTER — Encounter: Payer: Self-pay | Admitting: Adult Health

## 2020-11-24 ENCOUNTER — Other Ambulatory Visit: Payer: Self-pay

## 2020-11-24 VITALS — BP 120/80 | HR 55 | Temp 98.3°F | Ht 71.0 in | Wt 193.0 lb

## 2020-11-24 DIAGNOSIS — Z1159 Encounter for screening for other viral diseases: Secondary | ICD-10-CM | POA: Diagnosis not present

## 2020-11-24 DIAGNOSIS — G40211 Localization-related (focal) (partial) symptomatic epilepsy and epileptic syndromes with complex partial seizures, intractable, with status epilepticus: Secondary | ICD-10-CM | POA: Diagnosis not present

## 2020-11-24 DIAGNOSIS — N400 Enlarged prostate without lower urinary tract symptoms: Secondary | ICD-10-CM | POA: Diagnosis not present

## 2020-11-24 DIAGNOSIS — E782 Mixed hyperlipidemia: Secondary | ICD-10-CM | POA: Diagnosis not present

## 2020-11-24 LAB — LIPID PANEL
Cholesterol: 136 mg/dL (ref 0–200)
HDL: 46.9 mg/dL (ref >39.00)
LDL Cholesterol: 79 mg/dL (ref 0–99)
NonHDL: 89.2
Total CHOL/HDL Ratio: 3
Triglycerides: 53 mg/dL (ref 0.0–149.0)
VLDL: 10.6 mg/dL (ref 0.0–40.0)

## 2020-11-24 LAB — COMPREHENSIVE METABOLIC PANEL
ALT: 17 U/L (ref 0–53)
AST: 28 U/L (ref 0–37)
Albumin: 4.2 g/dL (ref 3.5–5.2)
Alkaline Phosphatase: 51 U/L (ref 39–117)
BUN: 24 mg/dL — ABNORMAL HIGH (ref 6–23)
CO2: 26 mEq/L (ref 19–32)
Calcium: 9.2 mg/dL (ref 8.4–10.5)
Chloride: 105 mEq/L (ref 96–112)
Creatinine, Ser: 1.14 mg/dL (ref 0.40–1.50)
GFR: 65.09 mL/min (ref >60.00)
Glucose, Bld: 96 mg/dL (ref 70–99)
Potassium: 4.3 mEq/L (ref 3.5–5.1)
Sodium: 139 mEq/L (ref 135–145)
Total Bilirubin: 0.5 mg/dL (ref 0.2–1.2)
Total Protein: 7.1 g/dL (ref 6.0–8.3)

## 2020-11-24 LAB — CBC WITH DIFFERENTIAL/PLATELET
Basophils Absolute: 0 10*3/uL (ref 0.0–0.1)
Basophils Relative: 0.6 % (ref 0.0–3.0)
Eosinophils Absolute: 0.3 10*3/uL (ref 0.0–0.7)
Eosinophils Relative: 4.7 % (ref 0.0–5.0)
HCT: 43.1 % (ref 39.0–52.0)
Hemoglobin: 14.5 g/dL (ref 13.0–17.0)
Lymphocytes Relative: 24.8 % (ref 12.0–46.0)
Lymphs Abs: 1.4 10*3/uL (ref 0.7–4.0)
MCHC: 33.7 g/dL (ref 30.0–36.0)
MCV: 92.5 fl (ref 78.0–100.0)
Monocytes Absolute: 0.5 10*3/uL (ref 0.1–1.0)
Monocytes Relative: 9.5 % (ref 3.0–12.0)
Neutro Abs: 3.3 10*3/uL (ref 1.4–7.7)
Neutrophils Relative %: 60.4 % (ref 43.0–77.0)
Platelets: 214 10*3/uL (ref 150.0–400.0)
RBC: 4.66 Mil/uL (ref 4.22–5.81)
RDW: 14.2 % (ref 11.5–15.5)
WBC: 5.5 10*3/uL (ref 4.0–10.5)

## 2020-11-24 LAB — PSA: PSA: 0.22 ng/mL (ref 0.10–4.00)

## 2020-11-24 LAB — TSH: TSH: 3.78 u[IU]/mL (ref 0.35–5.50)

## 2020-11-24 NOTE — Progress Notes (Addendum)
Subjective:    Patient ID: James Myers, male    DOB: [birth year], 70 y.o.   MRN: [id number]  HPI Patient presents for yearly preventative medicine examination. He is a pleasant 70 year old male who  has a past medical history of Arthritis, Brain aneurysm, Chicken pox, GERD (gastroesophageal reflux disease), Melanoma ([place]), and Stroke ([place]).  Epilepsy -partial symptomatic epilepsy with complex partial seizures.  He is followed by neurology.  He is compliant with Vimpat 100 mg twice daily and tolerates this medication without side effects.  Has not experienced any recent seizure-like activity.  Hyperlipidemia - takes Crestor 10 mg daily. He denies myalgia or fatigue  Lab Results  Component Value Date   CHOL 135 03/03/2019   HDL 50 03/03/2019   LDLCALC 71 03/03/2019   TRIG 68 03/03/2019   CHOLHDL 2.7 03/03/2019   H/o skin cancer -has routine visits with dermatology.  All immunizations and health maintenance protocols were reviewed with the patient and needed orders were placed. He is up to date on routine vaccinations   Appropriate screening laboratory values were ordered for the patient including screening of hyperlipidemia, renal function and hepatic function. If indicated by BPH, a PSA was ordered.  Medication reconciliation,  past medical history, social history, problem list and allergies were reviewed in detail with the patient  Goals were established with regard to weight loss, exercise, and  diet in compliance with medications Wt Readings from Last 3 Encounters:  11/24/20 193 lb (87.5 kg)  09/30/20 196 lb 9.6 oz (89.2 kg)  06/02/20 195 lb 12.8 oz (88.8 kg)   His last colonoscopy was in 05/2010 - last was cancelled in 2019 d/t syncopal episodes. He does not wish to have this done, despite knowing his risks   Review of Systems  Constitutional: Negative.   HENT: Negative.    Eyes: Negative.   Respiratory: Negative.    Cardiovascular: Negative.   Gastrointestinal: Negative.    Endocrine: Negative.   Genitourinary: Negative.   Musculoskeletal:  Positive for arthralgias.  Skin: Negative.   Allergic/Immunologic: Negative.   Neurological: Negative.   Hematological: Negative.   Psychiatric/Behavioral: Negative.    All other systems reviewed and are negative.  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 Currently resided in a private residence with his wife. Denies pets.    Fun: Golf and likes to swim / yard-work   Denies religious beliefs effecting healthcare.   One Child    Two grandchildren       Social Determinants of Radio broadcast assistant Strain: Not on file  Food Insecurity: Not on file  Transportation Needs: Not on  file  Physical Activity: Not on file  Stress: Not on file  Social Connections: Not on file  Intimate Partner Violence: Not on file    Past Surgical History:  Procedure Laterality Date   BRAIN SURGERY  1991   TONSILLECTOMY AND ADENOIDECTOMY      Family History  Problem Relation Age of Onset   Aneurysm Mother        brain    Arthritis Mother    Parkinson's disease Mother    Stroke Father    Arthritis Father    Syncope episode Father    Arthritis Maternal Grandmother    Diabetes Maternal Grandfather    Arthritis Paternal Grandmother    Tremor Brother     No  Active Allergies   Current Outpatient Medications on File Prior to Visit  Medication Sig Dispense Refill   Black Pepper-Turmeric 3-500 MG CAPS      diclofenac Sodium (VOLTAREN) 1 % GEL Apply 2 g topically 4 (four) times daily as needed. Apply topically 4 (four) times daily as needed. 150 g 6   Multiple Vitamins-Iron (MULTIVITAMINS WITH IRON) TABS tablet Take 1 tablet by mouth daily.     Omega-3 Fatty Acids (FISH OIL) 1200 MG CPDR      rosuvastatin (CRESTOR) 10 MG tablet TAKE ONE TABLET BY MOUTH DAILY 90 tablet 3   TURMERIC PO Take by mouth.     VIMPAT 100 MG TABS Take 1 tablet (100 mg total) by mouth in the morning and at bedtime. 180 tablet 1   No current facility-administered medications on file prior to visit.    BP 120/80   Pulse (!) 55   Temp 98.3 F (36.8 C) (Oral)   Ht 5\' 11"  (1.803 m)   Wt 193 lb (87.5 kg)   SpO2 98%   BMI 26.92 kg/m        Objective:   Physical Exam Vitals and nursing note reviewed.  Constitutional:      General: He is not in acute distress.    Appearance: Normal appearance. He is well-developed and normal weight.  HENT:     Head: Normocephalic and atraumatic.     Right Ear: Tympanic membrane, ear canal and external ear normal. There is no impacted cerumen.     Left Ear: Tympanic membrane, ear canal and external ear normal. There is no impacted cerumen.     Nose: Nose normal. No congestion or rhinorrhea.     Mouth/Throat:     Mouth: Mucous membranes are moist.     Pharynx: Oropharynx is clear. No oropharyngeal exudate or posterior oropharyngeal erythema.  Eyes:     General:        Right eye: No discharge.        Left eye: No discharge.     Extraocular Movements: Extraocular movements intact.     Conjunctiva/sclera: Conjunctivae normal.     Pupils: Pupils are equal, round, and reactive to light.  Neck:     Vascular: No carotid bruit.     Trachea: No tracheal deviation.  Cardiovascular:     Rate and Rhythm: Normal rate and regular rhythm.      Pulses: Normal pulses.     Heart sounds: Normal heart sounds. No murmur heard.   No friction rub. No gallop.  Pulmonary:     Effort: Pulmonary effort is normal. No respiratory distress.     Breath sounds: Normal breath sounds. No stridor. No wheezing, rhonchi or rales.  Chest:     Chest wall: No  tenderness.  Abdominal:     General: Bowel sounds are normal. There is no distension.     Palpations: Abdomen is soft. There is no mass.     Tenderness: There is no abdominal tenderness. There is no right CVA tenderness, left CVA tenderness, guarding or rebound.     Hernia: No hernia is present.  Musculoskeletal:        General: No swelling, tenderness, deformity or signs of injury. Normal range of motion.     Right lower leg: No edema.     Left lower leg: No edema.  Lymphadenopathy:     Cervical: No cervical adenopathy.  Skin:    General: Skin is warm and dry.     Capillary Refill: Capillary refill takes less than 2 seconds.     Coloration: Skin is not jaundiced or pale.     Findings: No bruising, erythema, lesion or rash.  Neurological:     General: No focal deficit present.     Mental Status: He is alert and oriented to person, place, and time.     Cranial Nerves: No cranial nerve deficit.     Sensory: No sensory deficit.     Motor: No weakness.     Coordination: Coordination normal.     Gait: Gait normal.     Deep Tendon Reflexes: Reflexes normal.  Psychiatric:        Mood and Affect: Mood normal.        Behavior: Behavior normal.        Thought Content: Thought content normal.        Judgment: Judgment normal.      Assessment & Plan:  1. Partial symptomatic epilepsy with complex partial seizures, intractable, with status epilepticus ([place]) - Follow up with Neurology as directed  - CBC with Differential/Platelet; Future - Comprehensive metabolic panel; Future - Lipid panel; Future - TSH; Future  2. Mixed hyperlipidemia - Consider increase in statin medication  - CBC  with Differential/Platelet; Future - Comprehensive metabolic panel; Future - Lipid panel; Future - TSH; Future  3. Need for hepatitis C screening test  - Hep C Antibody; Future  4. Benign prostatic hyperplasia without lower urinary tract symptoms  - PSA; Future  [employer]

## 2020-11-24 NOTE — Addendum Note (Signed)
Addended by: Rosalyn Gess D on: 11/24/2020 08:35 AM   Modules accepted: Orders

## 2020-11-24 NOTE — Patient Instructions (Signed)
It was great seeing you today   We will follow up with you regarding your blood work   Continue to eat healthy and exercise   I will see you back in one year or sooner if needed

## 2020-11-25 LAB — HEPATITIS C ANTIBODY
Hepatitis C Ab: NONREACTIVE
SIGNAL TO CUT-OFF: 0.03 (ref <1.00)

## 2021-01-03 ENCOUNTER — Ambulatory Visit (INDEPENDENT_AMBULATORY_CARE_PROVIDER_SITE_OTHER): Payer: Medicare Other

## 2021-01-03 VITALS — Ht 71.0 in | Wt 193.0 lb

## 2021-01-03 DIAGNOSIS — Z0000 Encounter for general adult medical examination without abnormal findings: Secondary | ICD-10-CM | POA: Diagnosis not present

## 2021-01-03 NOTE — Patient Instructions (Addendum)
James Myers , Thank you for taking time to come for your Medicare Wellness Visit. I appreciate your ongoing commitment to your health goals. Please review the following plan we discussed and let me know if I can assist you in the future.   These are the goals we discussed:  Goals      Exercise 150 min/wk Moderate Activity        This is a list of the screening recommended for you and due dates:  Health Maintenance  Topic Date Due   COVID-19 Vaccine (6 - Booster for Pfizer series) 12/29/2020   Colon Cancer Screening  11/24/2021*   Tetanus Vaccine  01/03/2022*   Pneumonia Vaccine  Completed   Flu Shot  Completed   Hepatitis C Screening: USPSTF Recommendation to screen - Ages 18-79 yo.  Completed   Zoster (Shingles) Vaccine  Completed   HPV Vaccine  Aged Out  *Topic was postponed. The date shown is not the original due date.     Advanced directives: Yes  Conditions/risks identified: None  Next appointment: Follow up in one year for your annual wellness visit.   Preventive Care 62 Years and Older, Male Preventive care refers to lifestyle choices and visits with your health care provider that can promote health and wellness. What does preventive care include? A yearly physical exam. This is also called an annual well check. Dental exams once or twice a year. Routine eye exams. Ask your health care provider how often you should have your eyes checked. Personal lifestyle choices, including: Daily care of your teeth and gums. Regular physical activity. Eating a healthy diet. Avoiding tobacco and drug use. Limiting alcohol use. Practicing safe sex. Taking low doses of aspirin every day. Taking vitamin and mineral supplements as recommended by your health care provider. What happens during an annual well check? The services and screenings done by your health care provider during your annual well check will depend on your age, overall health, lifestyle risk factors, and family  history of disease. Counseling  Your health care provider may ask you questions about your: Alcohol use. Tobacco use. Drug use. Emotional well-being. Home and relationship well-being. Sexual activity. Eating habits. History of falls. Memory and ability to understand (cognition). Work and work Statistician. Screening  You may have the following tests or measurements: Height, weight, and BMI. Blood pressure. Lipid and cholesterol levels. These may be checked every 5 years, or more frequently if you are over 10 years old. Skin check. Lung cancer screening. You may have this screening every year starting at age 40 if you have a 30-pack-year history of smoking and currently smoke or have quit within the past 15 years. Fecal occult blood test (FOBT) of the stool. You may have this test every year starting at age 71. Flexible sigmoidoscopy or colonoscopy. You may have a sigmoidoscopy every 5 years or a colonoscopy every 10 years starting at age 2. Prostate cancer screening. Recommendations will vary depending on your family history and other risks. Hepatitis C blood test. Hepatitis B blood test. Sexually transmitted disease (STD) testing. Diabetes screening. This is done by checking your blood sugar (glucose) after you have not eaten for a while (fasting). You may have this done every 1-3 years. Abdominal aortic aneurysm (AAA) screening. You may need this if you are a current or former smoker. Osteoporosis. You may be screened starting at age 25 if you are at high risk. Talk with your health care provider about your test results, treatment options, and  if necessary, the need for more tests. Vaccines  Your health care provider may recommend certain vaccines, such as: Influenza vaccine. This is recommended every year. Tetanus, diphtheria, and acellular pertussis (Tdap, Td) vaccine. You may need a Td booster every 10 years. Zoster vaccine. You may need this after age 10. Pneumococcal  13-valent conjugate (PCV13) vaccine. One dose is recommended after age 38. Pneumococcal polysaccharide (PPSV23) vaccine. One dose is recommended after age 73. Talk to your health care provider about which screenings and vaccines you need and how often you need them. This information is not intended to replace advice given to you by your health care provider. Make sure you discuss any questions you have with your health care provider. Document Released: 02/11/2015 Document Revised: 10/05/2015 Document Reviewed: 11/16/2014 Elsevier Interactive Patient Education  2017 [place] Prevention in the Home Falls can cause injuries. They can happen to people of all ages. There are many things you can do to make your home safe and to help prevent falls. What can I do on the outside of my home? Regularly fix the edges of walkways and driveways and fix any cracks. Remove anything that might make you trip as you walk through a door, such as a raised step or threshold. Trim any bushes or trees on the path to your home. Use bright outdoor lighting. Clear any walking paths of anything that might make someone trip, such as rocks or tools. Regularly check to see if handrails are loose or broken. Make sure that both sides of any steps have handrails. Any raised decks and porches should have guardrails on the edges. Have any leaves, snow, or ice cleared regularly. Use sand or salt on walking paths during winter. Clean up any spills in your garage right away. This includes oil or grease spills. What can I do in the bathroom? Use night lights. Install grab bars by the toilet and in the tub and shower. Do not use towel bars as grab bars. Use non-skid mats or decals in the tub or shower. If you need to sit down in the shower, use a plastic, non-slip stool. Keep the floor dry. Clean up any water that spills on the floor as soon as it happens. Remove soap buildup in the tub or shower regularly. Attach  bath mats securely with double-sided non-slip rug tape. Do not have throw rugs and other things on the floor that can make you trip. What can I do in the bedroom? Use night lights. Make sure that you have a light by your bed that is easy to reach. Do not use any sheets or blankets that are too big for your bed. They should not hang down onto the floor. Have a firm chair that has side arms. You can use this for support while you get dressed. Do not have throw rugs and other things on the floor that can make you trip. What can I do in the kitchen? Clean up any spills right away. Avoid walking on wet floors. Keep items that you use a lot in easy-to-reach places. If you need to reach something above you, use a strong step stool that has a grab bar. Keep electrical cords out of the way. Do not use floor polish or wax that makes floors slippery. If you must use wax, use non-skid floor wax. Do not have throw rugs and other things on the floor that can make you trip. What can I do with my stairs? Do not leave any  items on the stairs. Make sure that there are handrails on both sides of the stairs and use them. Fix handrails that are broken or loose. Make sure that handrails are as long as the stairways. Check any carpeting to make sure that it is firmly attached to the stairs. Fix any carpet that is loose or worn. Avoid having throw rugs at the top or bottom of the stairs. If you do have throw rugs, attach them to the floor with carpet tape. Make sure that you have a light switch at the top of the stairs and the bottom of the stairs. If you do not have them, ask someone to add them for you. What else can I do to help prevent falls? Wear shoes that: Do not have high heels. Have rubber bottoms. Are comfortable and fit you well. Are closed at the toe. Do not wear sandals. If you use a stepladder: Make sure that it is fully opened. Do not climb a closed stepladder. Make sure that both sides of the  stepladder are locked into place. Ask someone to hold it for you, if possible. Clearly mark and make sure that you can see: Any grab bars or handrails. First and last steps. Where the edge of each step is. Use tools that help you move around (mobility aids) if they are needed. These include: Canes. Walkers. Scooters. Crutches. Turn on the lights when you go into a dark area. Replace any light bulbs as soon as they burn out. Set up your furniture so you have a clear path. Avoid moving your furniture around. If any of your floors are uneven, fix them. If there are any pets around you, be aware of where they are. Review your medicines with your doctor. Some medicines can make you feel dizzy. This can increase your chance of falling. Ask your doctor what other things that you can do to help prevent falls. This information is not intended to replace advice given to you by your health care provider. Make sure you discuss any questions you have with your health care provider. Document Released: 11/11/2008 Document Revised: 06/23/2015 Document Reviewed: 02/19/2014 Elsevier Interactive Patient Education  2017 [employer].

## 2021-01-03 NOTE — Progress Notes (Addendum)
Subjective:   James Myers is a 70 y.o. male who presents for Medicare Annual/Subsequent preventive examination.  Review of Systems    No ROS      Objective:    Today's Vitals   01/03/21 1349  Weight: 193 lb (87.5 kg)  Height: 5\' 11"  (1.803 m)   Body mass index is 26.92 kg/m.  Advanced Directives 01/03/2021 11/18/2019 12/28/2015  Does Patient Have a Medical Advance Directive? Yes Yes Yes  Type of Paramedic of [place];Living will - [place];Living will  Does patient want to make changes to medical advance directive? No - Patient declined Yes (MAU/Ambulatory/Procedural Areas - Information given) -  Copy of [place] in Chart? No - copy requested - -    Current Medications (verified) Outpatient Encounter Medications as of 01/03/2021  Medication Sig   Black Pepper-Turmeric 3-500 MG CAPS    diclofenac Sodium (VOLTAREN) 1 % GEL Apply 2 g topically 4 (four) times daily as needed. Apply topically 4 (four) times daily as needed.   Multiple Vitamins-Iron (MULTIVITAMINS WITH IRON) TABS tablet Take 1 tablet by mouth daily.   Omega-3 Fatty Acids (FISH OIL) 1200 MG CPDR    rosuvastatin (CRESTOR) 10 MG tablet TAKE ONE TABLET BY MOUTH DAILY   TURMERIC PO Take by mouth.   VIMPAT 100 MG TABS Take 1 tablet (100 mg total) by mouth in the morning and at bedtime.   No facility-administered encounter medications on file as of 01/03/2021.    Allergies (verified) Patient has no active allergies.   History: Past Medical History:  Diagnosis Date   Arthritis    Brain aneurysm    Chicken pox    GERD (gastroesophageal reflux disease)    Melanoma (HCC)    Stroke [facility])    Past Surgical History:  Procedure Laterality Date   BRAIN SURGERY  1991   TONSILLECTOMY AND ADENOIDECTOMY     Family History  Problem Relation Age of Onset   Aneurysm Mother        brain    Arthritis Mother    Parkinson's disease Mother    Stroke Father     Arthritis Father    Syncope episode Father    Arthritis Maternal Grandmother    Diabetes Maternal Grandfather    Arthritis Paternal Grandmother    Tremor Brother    Social History   Socioeconomic History   Marital status: Married    Spouse name: Not on file   Number of children: 1   Years of education: 16   Highest education level: Not on file  Occupational History   Occupation: Retired     Comment: Geophysicist/field seismologist for [employer]  Tobacco Use   Smoking status: Never   Smokeless tobacco: Never  Substance and Sexual Activity   Alcohol use: Yes    Comment: 4 oz daily   Drug use: No   Sexual activity: Yes  Other Topics Concern   Not on file  Social History Narrative   Born and raised in [place]. Currently resided in a private residence with his wife. Denies pets.    Fun: Golf and likes to swim / yard-work   Denies religious beliefs effecting healthcare.   One Child    Two grandchildren       Social Determinants of Radio broadcast assistant Strain: Low Risk    Difficulty of Paying Living Expenses: Not hard at all  Food Insecurity: No Food Insecurity   Worried About Running Out  of Food in the Last Year: Never true   [place] in the Last Year: Never true  Transportation Needs: No Transportation Needs   Lack of Transportation (Medical): No   Lack of Transportation (Non-Medical): No  Physical Activity: Sufficiently Active   Days of Exercise per Week: 5 days   Minutes of Exercise per Session: 60 min  Stress: No Stress Concern Present   Feeling of Stress : Not at all  Social Connections: Moderately Integrated   Frequency of Communication with Friends and Family: More than three times a week   Frequency of Social Gatherings with Friends and Family: More than three times a week   Attends Religious Services: Never   Marine scientist or Organizations: Yes   Attends Music therapist: More than 4 times per year   Marital Status: Married    Clinical  Intake:  How often do you need to have someone help you when you read instructions, pamphlets, or other written materials from your doctor or pharmacy?: 1 - Never  Diabetic? No  Activities of Daily Living In your present state of health, do you have any difficulty performing the following activities: 01/03/2021 01/03/2021  Hearing? N N  Vision? N N  Comment Wears reading glasses. Followed by Dr Katy Fitch -  Difficulty concentrating or making decisions? N N  Walking or climbing stairs? N N  Dressing or bathing? N N  Doing errands, shopping? N N  Preparing Food and eating ? N N  Using the Toilet? N N  In the past six months, have you accidently leaked urine? N N  Do you have problems with loss of bowel control? N N  Managing your Medications? N N  Managing your Finances? N N  Housekeeping or managing your Housekeeping? N N  Some recent data might be hidden    Patient Care Team: Dorothyann Peng, NP as PCP - General (Family Medicine) Troy Sine, MD as PCP - Cardiology (Cardiology)  Indicate any recent Medical Services you may have received from other than [facility] providers in the past year (date may be approximate).     Assessment:   This is a routine wellness examination for [facility].  Virtual Visit via Telephone Note  I connected with  James Myers on 01/03/21 at  1:45 PM EST by telephone and verified that I am speaking with the correct person using two identifiers.  Location: Patient: Home Provider: Office Persons participating in the virtual visit: patient/Nurse Health Advisor   I discussed the limitations, risks, security and privacy concerns of performing an evaluation and management service by telephone and the availability of in person appointments. The patient expressed understanding and agreed to proceed.  Interactive audio and video telecommunications were attempted between this nurse and patient, however failed, due to patient having technical difficulties OR patient did  not have access to video capability.  We continued and completed visit with audio only.  Some vital signs may be absent or patient reported.   Criselda Peaches, LPN   Hearing/Vision screen Hearing Screening - Comments:: No difficulty hearing Vision Screening - Comments:: Wears reading glasses. Followed by Dr Katy Fitch  Dietary issues and exercise activities discussed: Current Exercise Habits: Home exercise routine, Type of exercise: walking, Time (Minutes): 30, Frequency (Times/Week): 5, Weekly Exercise (Minutes/Week): 150, Intensity: Moderate   Goals Addressed             This Visit's Progress    Exercise 150 min/wk Moderate Activity  Depression Screen PHQ 2/9 Scores 01/03/2021 11/24/2020 11/18/2019  PHQ - 2 Score 0 0 0  PHQ- 9 Score - - 0    Fall Risk Fall Risk  01/03/2021 01/03/2021 01/01/2021 11/24/2020 11/18/2019  Falls in the past year? 0 0 0 1 0  Comment - - - - -  Number falls in past yr: 0 - - 0 0  Injury with Fall? 0 - - 0 0  Risk for fall due to : - - - - No Fall Risks    FALL RISK PREVENTION PERTAINING TO THE HOME:  Any stairs in or around the home? Yes  If so, are there any without handrails? No  Home free of loose throw rugs in walkways, pet beds, electrical cords, etc? Yes  Adequate lighting in your home to reduce risk of falls? Yes   ASSISTIVE DEVICES UTILIZED TO PREVENT FALLS:  Life alert? No  Use of a cane, walker or w/c? No  Grab bars in the bathroom? No  Shower chair or bench in shower? No  Elevated toilet seat or a handicapped toilet? No   TIMED UP AND GO:  Was the test performed? No  Audio Visit.  Cognitive Function:     6CIT Screen 01/03/2021 11/18/2019  What Year? 0 points 0 points  What month? 0 points 0 points  What time? 0 points 0 points  Count back from 20 0 points 0 points  Months in reverse 0 points 0 points  Repeat phrase 0 points 0 points  Total Score 0 0    Immunizations Immunization History  Administered Date(s)  Administered   Fluad Quad(high Dose 65+) 10/06/2019   Influenza, High Dose Seasonal PF 12/09/2015, 10/24/2018   Influenza-Unspecified 10/24/2018, 11/03/2020   PFIZER Comirnaty(Gray Top)Covid-19 Tri-Sucrose Vaccine 06/23/2020, 11/03/2020   PFIZER(Purple Top)SARS-COV-2 Vaccination 02/18/2019, 03/11/2019, 11/05/2019, 11/03/2020   Pneumococcal Conjugate-13 07/22/2017   Pneumococcal Polysaccharide-23 12/01/2013, 11/18/2019   Zoster Recombinat (Shingrix) 10/24/2018, 01/13/2019    TDAP status: Due, Education has been provided regarding the importance of this vaccine. Advised may receive this vaccine at local pharmacy or Health Dept. Aware to provide a copy of the vaccination record if obtained from local pharmacy or Health Dept. Verbalized acceptance and understanding.  Flu Vaccine status: Up to date  Pneumococcal vaccine status: Up to date  Covid-19 vaccine status: Completed vaccines   Screening Tests Health Maintenance  Topic Date Due   COVID-19 Vaccine (6 - Booster for Pfizer series) 12/29/2020   COLONOSCOPY (Pts 45-58yrs Insurance coverage will need to be confirmed)  11/24/2021 (Originally 06/07/2020)   TETANUS/TDAP  01/03/2022 (Originally 03/27/1969)   Pneumonia Vaccine 44+ Years old  Completed   INFLUENZA VACCINE  Completed   Hepatitis C Screening  Completed   Zoster Vaccines- Shingrix  Completed   HPV VACCINES  Aged Out    Health Maintenance  Health Maintenance Due  Topic Date Due   COVID-19 Vaccine (6 - Booster for [place] series) 12/29/2020    Additional Screening:  Vision Screening: Recommended annual ophthalmology exams for early detection of glaucoma and other disorders of the eye. Is the patient up to date with their annual eye exam?  Yes  Who is the provider or what is the name of the office in which the patient attends annual eye exams? Followed by Dr Katy Fitch   Dental Screening: Recommended annual dental exams for proper oral hygiene  Community Resource Referral /  Chronic Care Management:  CRR required this visit?  No   CCM required this visit?  No      Plan:     I have personally reviewed and noted the following in the patient's chart:   Medical and social history Use of alcohol, tobacco or illicit drugs  Current medications and supplements including opioid prescriptions. Patient is not currently taking opioid prescriptions. Functional ability and status Nutritional status Physical activity Advanced directives List of other physicians Hospitalizations, surgeries, and ER visits in previous 12 months Vitals Screenings to include cognitive, depression, and falls Referrals and appointments  In addition, I have reviewed and discussed with patient certain preventive protocols, quality metrics, and best practice recommendations. A written personalized care plan for preventive services as well as general preventive health recommendations were provided to patient.     Criselda Peaches, LPN   [id number]

## 2021-01-05 ENCOUNTER — Encounter (INDEPENDENT_AMBULATORY_CARE_PROVIDER_SITE_OTHER): Payer: Self-pay | Admitting: Ophthalmology

## 2021-01-05 ENCOUNTER — Ambulatory Visit (INDEPENDENT_AMBULATORY_CARE_PROVIDER_SITE_OTHER): Payer: Medicare Other | Admitting: Ophthalmology

## 2021-01-05 ENCOUNTER — Other Ambulatory Visit: Payer: Self-pay

## 2021-01-05 DIAGNOSIS — D3131 Benign neoplasm of right choroid: Secondary | ICD-10-CM | POA: Diagnosis not present

## 2021-01-05 NOTE — Assessment & Plan Note (Signed)
Stable OD, no interval change.  Further history patient remembers having been told that he had this some 10 years previous.  And thus still a small low risk lesion.  With no interval change of the last 6 months documented, we will have the patient follow-up in 1 year with this low risk finding

## 2021-01-05 NOTE — Progress Notes (Signed)
01/05/2021     CHIEF COMPLAINT Patient presents for  Chief Complaint  Patient presents with   Retina Follow Up      HISTORY OF PRESENT ILLNESS: James Myers is a 70 y.o. male who presents to the clinic today for:   HPI     Retina Follow Up           Diagnosis: Other   Laterality: right eye   Onset: 6 months ago   Severity: mild   Duration: 6 months         Comments   6 mos fu OD fp. Patient states vision is stable and unchanged since last visit. Denies any new floaters or FOL.       Last edited by Laurin Coder on 01/05/2021  8:02 AM.      Referring physician: Debbra Riding, MD [address] [place],  [place] [address]  HISTORICAL INFORMATION:   Selected notes from the MEDICAL RECORD NUMBER       CURRENT MEDICATIONS: No current outpatient medications on file. (Ophthalmic Drugs)   No current facility-administered medications for this visit. (Ophthalmic Drugs)   Current Outpatient Medications (Other)  Medication Sig   Black Pepper-Turmeric 3-500 MG CAPS    diclofenac Sodium (VOLTAREN) 1 % GEL Apply 2 g topically 4 (four) times daily as needed. Apply topically 4 (four) times daily as needed.   Multiple Vitamins-Iron (MULTIVITAMINS WITH IRON) TABS tablet Take 1 tablet by mouth daily.   Omega-3 Fatty Acids (FISH OIL) 1200 MG CPDR    rosuvastatin (CRESTOR) 10 MG tablet TAKE ONE TABLET BY MOUTH DAILY   TURMERIC PO Take by mouth.   VIMPAT 100 MG TABS Take 1 tablet (100 mg total) by mouth in the morning and at bedtime.   No current facility-administered medications for this visit. (Other)      REVIEW OF SYSTEMS:    ALLERGIES No Active Allergies  PAST MEDICAL HISTORY Past Medical History:  Diagnosis Date   Arthritis    Brain aneurysm    Chicken pox    GERD (gastroesophageal reflux disease)    Melanoma (HCC)    Stroke [facility])    Past Surgical History:  Procedure Laterality Date   BRAIN SURGERY  1991   TONSILLECTOMY AND  ADENOIDECTOMY      FAMILY HISTORY Family History  Problem Relation Age of Onset   Aneurysm Mother        brain    Arthritis Mother    Parkinson's disease Mother    Stroke Father    Arthritis Father    Syncope episode Father    Arthritis Maternal Grandmother    Diabetes Maternal Grandfather    Arthritis Paternal Grandmother    Tremor Brother     SOCIAL HISTORY Social History   Tobacco Use   Smoking status: Never   Smokeless tobacco: Never  Substance Use Topics   Alcohol use: Yes    Comment: 4 oz daily   Drug use: No         OPHTHALMIC EXAM:  Base Eye Exam     Visual Acuity (ETDRS)       Right Left   Dist [place] 20/20 20/20 -1         Tonometry (Tonopen, 8:04 AM)       Right Left   Pressure 13 12         Pupils       Pupils Dark Light APD   Right PERRL 5 4  None   Left PERRL 5 4 None         Visual Fields (Counting fingers)       Left Right    Full Full         Extraocular Movement       Right Left    Full Full         Neuro/Psych     Oriented x3: Yes   Mood/Affect: Normal         Dilation     Right eye: 1.0% Mydriacyl, 2.5% Phenylephrine @ 8:04 AM           Slit Lamp and Fundus Exam     External Exam       Right Left   External Normal Normal         Slit Lamp Exam       Right Left   Lids/Lashes Normal Normal   Conjunctiva/Sclera White and quiet White and quiet   Cornea Clear Clear   Anterior Chamber Deep and quiet Deep and quiet   Iris Round and reactive Round and reactive   Lens 2+ Nuclear sclerosis 2+ Nuclear sclerosis   Anterior Vitreous Normal Normal         Fundus Exam       Right Left   Posterior Vitreous Posterior vitreous detachment Not dilated   Disc Normal    C/D Ratio 0.25    Macula Normal    Vessels Normal    Periphery Choroidal nevus, small, 1.5 dd, regular pigment, flat, no subretinal fluid, no lipofuscin, no atrophy.  No high risk features.             IMAGING AND  PROCEDURES  Imaging and Procedures for 01/05/21  Color Fundus Photography Optos - OU - Both Eyes       Right Eye Progression has no prior data. Disc findings include normal observations. Macula : normal observations. Vessels : normal observations.   Left Eye Progression has no prior data. Disc findings include normal observations. Macula : normal observations. Vessels : normal observations. Periphery : normal observations.   Notes Choroidal nevus along the inferotemporal arcade just posterior to the equator.  1.25 horizontal dimension, 1.5-1.65 in the vertical dimension, flat, low risk, white drusen, no fluid, no lipofuscin, no atrophy  Follow-up 1 year             ASSESSMENT/PLAN:  Choroidal nevus of right eye Stable OD, no interval change.  Further history patient remembers having been told that he had this some 10 years previous.  And thus still a small low risk lesion.  With no interval change of the last 6 months documented, we will have the patient follow-up in 1 year with this low risk finding     ICD-10-CM   1. Choroidal nevus of right eye  D31.31 Color Fundus Photography Optos - OU - Both Eyes      1.  OD no interval change over the last 6 months.  Very low risk features, and likely no change over the last 10 years as he recalls after our last visit that he had been noted to have this finding some years previous.  2.  Dilate follow-up in 1 year  3.  Ophthalmic Meds Ordered this visit:  No orders of the defined types were placed in this encounter.      Return in about 1 year (around 01/05/2022) for DILATE OU, COLOR FP.  There are no Patient Instructions on file for this visit.  Explained the diagnoses, plan, and follow up with the patient and they expressed understanding.  Patient expressed understanding of the importance of proper follow up care.   Clent Demark Vilas Edgerly M.D. Diseases & Surgery of the Retina and Vitreous Retina & Diabetic [place] 01/05/21     Abbreviations: M myopia (nearsighted); A astigmatism; H hyperopia (farsighted); P presbyopia; Mrx spectacle prescription;  CTL contact lenses; OD right eye; OS left eye; OU both eyes  XT exotropia; ET esotropia; PEK punctate epithelial keratitis; PEE punctate epithelial erosions; DES dry eye syndrome; MGD meibomian gland dysfunction; ATs artificial tears; PFAT's preservative free artificial tears; [place] nuclear sclerotic cataract; PSC posterior subcapsular cataract; ERM epi-retinal membrane; PVD posterior vitreous detachment; RD retinal detachment; DM diabetes mellitus; DR diabetic retinopathy; NPDR non-proliferative diabetic retinopathy; PDR proliferative diabetic retinopathy; CSME clinically significant macular edema; DME diabetic macular edema; dbh dot blot hemorrhages; CWS cotton wool spot; POAG primary open angle glaucoma; C/D cup-to-disc ratio; HVF humphrey visual field; GVF goldmann visual field; OCT optical coherence tomography; IOP intraocular pressure; BRVO Branch retinal vein occlusion; CRVO central retinal vein occlusion; CRAO central retinal artery occlusion; BRAO branch retinal artery occlusion; RT retinal tear; SB scleral buckle; PPV pars plana vitrectomy; VH Vitreous hemorrhage; PRP panretinal laser photocoagulation; IVK intravitreal kenalog; VMT vitreomacular traction; MH Macular hole;  NVD neovascularization of the disc; NVE neovascularization elsewhere; AREDS age related eye disease study; ARMD age related macular degeneration; POAG primary open angle glaucoma; EBMD epithelial/anterior basement membrane dystrophy; ACIOL anterior chamber intraocular lens; IOL intraocular lens; PCIOL posterior chamber intraocular lens; Phaco/IOL phacoemulsification with intraocular lens placement; [place] photorefractive keratectomy; LASIK laser assisted in situ keratomileusis; HTN hypertension; DM diabetes mellitus; COPD chronic obstructive pulmonary disease

## 2021-01-18 DIAGNOSIS — Z85828 Personal history of other malignant neoplasm of skin: Secondary | ICD-10-CM | POA: Diagnosis not present

## 2021-01-18 DIAGNOSIS — D0339 Melanoma in situ of other parts of face: Secondary | ICD-10-CM | POA: Diagnosis not present

## 2021-01-18 DIAGNOSIS — L853 Xerosis cutis: Secondary | ICD-10-CM | POA: Diagnosis not present

## 2021-01-18 DIAGNOSIS — L245 Irritant contact dermatitis due to other chemical products: Secondary | ICD-10-CM | POA: Diagnosis not present

## 2021-01-18 DIAGNOSIS — L570 Actinic keratosis: Secondary | ICD-10-CM | POA: Diagnosis not present

## 2021-01-18 DIAGNOSIS — D485 Neoplasm of uncertain behavior of skin: Secondary | ICD-10-CM | POA: Diagnosis not present

## 2021-01-18 DIAGNOSIS — L821 Other seborrheic keratosis: Secondary | ICD-10-CM | POA: Diagnosis not present

## 2021-03-03 ENCOUNTER — Other Ambulatory Visit: Payer: Self-pay | Admitting: Adult Health

## 2021-03-03 DIAGNOSIS — G40209 Localization-related (focal) (partial) symptomatic epilepsy and epileptic syndromes with complex partial seizures, not intractable, without status epilepticus: Secondary | ICD-10-CM

## 2021-03-14 DIAGNOSIS — D485 Neoplasm of uncertain behavior of skin: Secondary | ICD-10-CM | POA: Diagnosis not present

## 2021-03-14 DIAGNOSIS — Z85820 Personal history of malignant melanoma of skin: Secondary | ICD-10-CM | POA: Diagnosis not present

## 2021-03-14 DIAGNOSIS — Z85828 Personal history of other malignant neoplasm of skin: Secondary | ICD-10-CM | POA: Diagnosis not present

## 2021-03-14 DIAGNOSIS — D2339 Other benign neoplasm of skin of other parts of face: Secondary | ICD-10-CM | POA: Diagnosis not present

## 2021-04-25 ENCOUNTER — Other Ambulatory Visit: Payer: Self-pay | Admitting: Cardiovascular Disease

## 2021-04-26 ENCOUNTER — Ambulatory Visit (INDEPENDENT_AMBULATORY_CARE_PROVIDER_SITE_OTHER): Payer: Medicare Other | Admitting: Adult Health

## 2021-04-26 ENCOUNTER — Ambulatory Visit (INDEPENDENT_AMBULATORY_CARE_PROVIDER_SITE_OTHER): Payer: Medicare Other

## 2021-04-26 VITALS — BP 110/68 | HR 59 | Temp 98.5°F | Ht 71.0 in | Wt 196.0 lb

## 2021-04-26 DIAGNOSIS — T148XXA Other injury of unspecified body region, initial encounter: Secondary | ICD-10-CM

## 2021-04-26 DIAGNOSIS — M25512 Pain in left shoulder: Secondary | ICD-10-CM

## 2021-04-26 DIAGNOSIS — G8929 Other chronic pain: Secondary | ICD-10-CM

## 2021-04-26 NOTE — Progress Notes (Signed)
? ?Subjective:  ? ? Patient ID: James Myers, male    DOB: Jun 26, 1950, 71 y.o.   MRN: [id number] ? ?HPI ?71 year old male who  has a past medical history of Arthritis, Brain aneurysm, Chicken pox, GERD (gastroesophageal reflux disease), Melanoma ([place]), and Stroke ([place]). ? ?He presents to the office today for two separate issues.  ? ?First issue involves chronic left shoulder pain.  He reports that he has had shoulder pain for quite some time but it seems to be becoming progressively worse.  When he goes to the gym to work out he is only able to lift roughly 10 pounds over his head.  He does hear a clicking and feels a popping sensation with different ranges of motion.  He does have full range of motion of his left shoulder.  Uses Voltaren and Aspercreme with some mild relief. ? ?Second issue is that of left buttock pain.  He reports that about 3 days ago he was playing with his young grandson and tripped catching his foot on a chair.  He did not fall onto the ground but since that time he has had pain in his right buttocks.  Denies any right hip pain.  At home he has been doing massage therapy and using Voltaren with improvement in his discomfort.  Pain is worse when sitting and feels better when standing or walking.  He has noticed the stretching sensation when he bends at the waist to try and touch his toes. ? ?Review of Systems ?See HPI  ? ?Past Medical History:  ?Diagnosis Date  ? Arthritis   ? Brain aneurysm   ? Chicken pox   ? GERD (gastroesophageal reflux disease)   ? Melanoma ([place])   ? Stroke [facility])   ? ? ?Social History  ? ?Socioeconomic History  ? Marital status: Married  ?  Spouse name: Not on file  ? Number of children: 1  ? Years of education: 81  ? Highest education level: Bachelor's degree (e.g., BA, AB, BS)  ?Occupational History  ? Occupation: Retired   ?  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 Smoking status: Never  ? Smokeless tobacco: Never  ?Substance and Sexual Activity  ? Alcohol use:  Yes  ?  Comment: 4 oz daily  ? Drug use: No  ? Sexual activity: Yes  ?Other Topics Concern  ? Not on file  ?Social History Narrative  ? Born and raised in [place]. Currently resided in a private residence with his wife. Denies pets.   ? Fun: Golf and likes to swim / yard-work  ? Denies religious beliefs effecting healthcare.  ? One Child   ? Two grandchildren   ?   ? ?Social Determinants of Health  ? ?Financial Resource Strain: Low Risk   ? Difficulty of Paying Living Expenses: Not hard at all  ?Food Insecurity: No Food Insecurity  ? Worried About Charity fundraiser in the Last Year: Never true  ? Ran Out of Food in the Last Year: Never true  ?Transportation Needs: No Transportation Needs  ? Lack of Transportation (Medical): No  ? Lack of Transportation (Non-Medical): No  ?Physical Activity: Sufficiently Active  ? Days of Exercise per Week: 4 days  ? Minutes of Exercise per Session: 110 min  ?Stress: No Stress Concern Present  ? Feeling of Stress : Only a little  ?Social Connections: Moderately Integrated  ? Frequency of Communication with Friends and Family: Twice a week  ? Frequency  of Social Gatherings with Friends and Family: Once a week  ? Attends Religious Services: Never  ? Active Member of Clubs or Organizations: No  ? Attends Archivist Meetings: More than 4 times per year  ? Marital Status: Married  ?Intimate Partner Violence: Not At Risk  ? Fear of Current or Ex-Partner: No  ? Emotionally Abused: No  ? Physically Abused: No  ? Sexually Abused: No  ? ? ?Past Surgical History:  ?Procedure Laterality Date  ? BRAIN SURGERY  1991  ? TONSILLECTOMY AND ADENOIDECTOMY    ? ? ?Family History  ?Problem Relation Age of Onset  ? Aneurysm Mother   ?     brain   ? Arthritis Mother   ? Parkinson's disease Mother   ? Stroke Father   ? Arthritis Father   ? Syncope episode Father   ? Arthritis Maternal Grandmother   ? Diabetes Maternal Grandfather   ? Arthritis Paternal Grandmother   ? Tremor Brother   ? ? ?No  Active Allergies ? ?Current Outpatient Medications on File Prior to Visit  ?Medication Sig Dispense Refill  ? Black Pepper-Turmeric 3-500 MG CAPS     ? diclofenac Sodium (VOLTAREN) 1 % GEL Apply 2 g topically 4 (four) times daily as needed. Apply topically 4 (four) times daily as needed. 150 g 6  ? Multiple Vitamins-Iron (MULTIVITAMINS WITH IRON) TABS tablet Take 1 tablet by mouth daily.    ? Omega-3 Fatty Acids (FISH OIL) 1200 MG CPDR     ? rosuvastatin (CRESTOR) 10 MG tablet Take 1 tablet (10 mg total) by mouth daily. Please schedule appt for future refills. 1st attempt 90 tablet 0  ? TURMERIC PO Take by mouth.    ? VIMPAT 100 MG TABS TAKE ONE TABLET BY MOUTH EVERY MORNING AND TAKE ONE TABLET BY MOUTH EVERY NIGHT AT BEDTIME 180 tablet 1  ? fluorouracil (EFUDEX) 5 % cream Apply 1 application. topically 2 (two) times daily.    ? hydrocortisone 2.5 % ointment SMARTSIG:1 sparingly Topical Twice Daily    ? imiquimod (ALDARA) 5 % cream Apply topically.    ? ULTRAM 50 MG tablet Take 50 mg by mouth every 4 (four) hours as needed.    ? ?No current facility-administered medications on file prior to visit.  ? ? ?BP 110/68   Pulse (!) 59   Temp 98.5 ?F (36.9 ?C) (Oral)   Ht '5\' 11"'$  (1.803 m)   Wt 196 lb (88.9 kg)   SpO2 98%   BMI 27.34 kg/m?  ? ? ?   ?Objective:  ? Physical Exam ?Vitals and nursing note reviewed.  ?Constitutional:   ?   Appearance: Normal appearance.  ?Cardiovascular:  ?   Rate and Rhythm: Normal rate and regular rhythm.  ?   Pulses: Normal pulses.  ?   Heart sounds: Normal heart sounds.  ?Pulmonary:  ?   Effort: Pulmonary effort is normal.  ?   Breath sounds: Normal breath sounds.  ?Musculoskeletal:     ?   General: Tenderness present. Normal range of motion.  ?   Left shoulder: Bony tenderness and crepitus present. No swelling or deformity. Normal range of motion. Decreased strength. Normal pulse.  ?     Legs: ? ?Skin: ?   General: Skin is warm and dry.  ?Neurological:  ?   General: No focal deficit  present.  ?   Mental Status: He is alert and oriented to person, place, and time.  ?Psychiatric:     ?  Mood and Affect: Mood normal.     ?   Behavior: Behavior normal.     ?   Thought Content: Thought content normal.     ?   Judgment: Judgment normal.  ? ?   ?Assessment & Plan:  ?1. Chronic left shoulder pain ?-Does not appear to be rotator cuff issue since she has full range of motion.  Likely arthritic discomfort.  We will get x-ray of the left shoulder today and consider steroid injection in the future ?- DG Shoulder Left; Future ? ?2. Muscle strain ?-He would like to hold off on any treatment at this time since the pain is getting better which I think is reasonable.  Encouraged using a heating pad as well as stretching exercises.  He can continue to use over-the-counter creams ? ?Dorothyann Peng, NP ? ?Time of total for care on the day of the encounter 30 minutes. This includes time spent in both face-to-face and non-face-to-face activities including preparing for the visit, reviewing the chart, time spent with patient, evaluation and counseling patient/family/caregiver, coordinating care, and time spent documenting in the chart which was performed on the date of service (04/26/2021). Note: this excludes any time spent performing billable procedures or separate charges (such as time spent counseling smoking cessation); these charges are billed separately.  ? ? ? ?

## 2021-04-28 ENCOUNTER — Encounter: Payer: Self-pay | Admitting: Adult Health

## 2021-04-28 NOTE — Telephone Encounter (Signed)
Please advise ok to schedule pt?  ?

## 2021-05-03 ENCOUNTER — Ambulatory Visit (INDEPENDENT_AMBULATORY_CARE_PROVIDER_SITE_OTHER): Payer: Medicare Other | Admitting: Adult Health

## 2021-05-03 ENCOUNTER — Encounter: Payer: Self-pay | Admitting: Adult Health

## 2021-05-03 VITALS — BP 110/70 | HR 58 | Temp 98.4°F | Ht 71.0 in | Wt 196.0 lb

## 2021-05-03 DIAGNOSIS — M25512 Pain in left shoulder: Secondary | ICD-10-CM

## 2021-05-03 DIAGNOSIS — G8929 Other chronic pain: Secondary | ICD-10-CM | POA: Diagnosis not present

## 2021-05-03 MED ORDER — METHYLPREDNISOLONE ACETATE 80 MG/ML IJ SUSP
80.0000 mg | Freq: Once | INTRAMUSCULAR | Status: AC
  Administered 2021-05-03: 80 mg via INTRA_ARTICULAR

## 2021-05-03 NOTE — Progress Notes (Signed)
? ?Subjective:  ? ? Patient ID: Halo Laski, male    DOB: 08/09/1950, 71 y.o.   MRN: [id number] ? ?HPI ?71 year old male who  has a past medical history of Arthritis, Brain aneurysm, Chicken pox, GERD (gastroesophageal reflux disease), Melanoma ([place]), and Stroke ([place]). ? ?She presents to the office today for follow-up regarding chronic left shoulder pain.  He was seen last week at which time we had an x-ray done which showed " Mild acromioclavicular and glenohumeral osteoarthritis."  ? ?We discussed various treatment related to his x-ray including physical therapy and a steroid injection.  He would like to go through with a steroid injection today ? ? ?Review of Systems ?See HPI  ? ?Past Medical History:  ?Diagnosis Date  ? Arthritis   ? Brain aneurysm   ? Chicken pox   ? GERD (gastroesophageal reflux disease)   ? Melanoma ([place])   ? Stroke [facility])   ? ? ?Social History  ? ?Socioeconomic History  ? Marital status: Married  ?  Spouse name: Not on file  ? Number of children: 1  ? Years of education: 78  ? Highest education level: Bachelor's degree (e.g., BA, AB, BS)  ?Occupational History  ? Occupation: Retired   ?  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 Smoking status: Never  ? Smokeless tobacco: Never  ?Substance and Sexual Activity  ? Alcohol use: Yes  ?  Comment: 4 oz daily  ? Drug use: No  ? Sexual activity: Yes  ?Other Topics Concern  ? Not on file  ?Social History Narrative  ? Born and raised in [place]. Currently resided in a private residence with his wife. Denies pets.   ? Fun: Golf and likes to swim / yard-work  ? Denies religious beliefs effecting healthcare.  ? One Child   ? Two grandchildren   ?   ? ?Social Determinants of Health  ? ?Financial Resource Strain: Low Risk   ? Difficulty of Paying Living Expenses: Not hard at all  ?Food Insecurity: No Food Insecurity  ? Worried About Charity fundraiser in the Last Year: Never true  ? Ran Out of Food in the Last Year: Never true  ?Transportation Needs:  No Transportation Needs  ? Lack of Transportation (Medical): No  ? Lack of Transportation (Non-Medical): No  ?Physical Activity: Sufficiently Active  ? Days of Exercise per Week: 4 days  ? Minutes of Exercise per Session: 110 min  ?Stress: No Stress Concern Present  ? Feeling of Stress : Only a little  ?Social Connections: Moderately Integrated  ? Frequency of Communication with Friends and Family: Twice a week  ? Frequency of Social Gatherings with Friends and Family: Once a week  ? Attends Religious Services: Never  ? Active Member of Clubs or Organizations: No  ? Attends Archivist Meetings: More than 4 times per year  ? Marital Status: Married  ?Intimate Partner Violence: Not At Risk  ? Fear of Current or Ex-Partner: No  ? Emotionally Abused: No  ? Physically Abused: No  ? Sexually Abused: No  ? ? ?Past Surgical History:  ?Procedure Laterality Date  ? BRAIN SURGERY  1991  ? TONSILLECTOMY AND ADENOIDECTOMY    ? ? ?Family History  ?Problem Relation Age of Onset  ? Aneurysm Mother   ?     brain   ? Arthritis Mother   ? Parkinson's disease Mother   ? Stroke Father   ? Arthritis Father   ?  Syncope episode Father   ? Arthritis Maternal Grandmother   ? Diabetes Maternal Grandfather   ? Arthritis Paternal Grandmother   ? Tremor Brother   ? ? ?No Active Allergies ? ?Current Outpatient Medications on File Prior to Visit  ?Medication Sig Dispense Refill  ? Black Pepper-Turmeric 3-500 MG CAPS     ? diclofenac Sodium (VOLTAREN) 1 % GEL Apply 2 g topically 4 (four) times daily as needed. Apply topically 4 (four) times daily as needed. 150 g 6  ? fluorouracil (EFUDEX) 5 % cream Apply 1 application. topically 2 (two) times daily.    ? hydrocortisone 2.5 % ointment SMARTSIG:1 sparingly Topical Twice Daily    ? imiquimod (ALDARA) 5 % cream Apply topically.    ? Multiple Vitamins-Iron (MULTIVITAMINS WITH IRON) TABS tablet Take 1 tablet by mouth daily.    ? Omega-3 Fatty Acids (FISH OIL) 1200 MG CPDR     ? rosuvastatin  (CRESTOR) 10 MG tablet Take 1 tablet (10 mg total) by mouth daily. Please schedule appt for future refills. 1st attempt 90 tablet 0  ? TURMERIC PO Take by mouth.    ? ULTRAM 50 MG tablet Take 50 mg by mouth every 4 (four) hours as needed.    ? VIMPAT 100 MG TABS TAKE ONE TABLET BY MOUTH EVERY MORNING AND TAKE ONE TABLET BY MOUTH EVERY NIGHT AT BEDTIME 180 tablet 1  ? ?No current facility-administered medications on file prior to visit.  ? ? ?There were no vitals taken for this visit. ? ? ?   ?Objective:  ? Physical Exam ?Vitals and nursing note reviewed.  ?Constitutional:   ?   Appearance: Normal appearance.  ?Musculoskeletal:     ?   General: No swelling.  ?   Left shoulder: Bony tenderness and crepitus present. Normal strength.  ?Skin: ?   General: Skin is warm and dry.  ?Neurological:  ?   General: No focal deficit present.  ?   Mental Status: He is alert and oriented to person, place, and time.  ?Psychiatric:     ?   Mood and Affect: Mood normal.     ?   Behavior: Behavior normal.     ?   Thought Content: Thought content normal.     ?   Judgment: Judgment normal.  ? ? ?   ?Assessment & Plan:  ?1. Chronic left shoulder pain ?Shoulder injection ?Verbal consent obtained and verified. ?Sterile betadine prep. Furthur cleansed with alcohol. ?Topical analgesic spray: Ethyl chloride. ?Joint: left  subacromial injection ?Approached in typical fashion with: posterior approach ?Completed without difficulty ?Meds: 3 cc lidocaine 2% no epi, 1 cc depomedrol '80mg'$ /cc ?Needle:1.5 inch 25 gauge ?Aftercare instructions and Red flags advised. ?Immediate improvement in pain noted ? ?- methylPREDNISolone acetate (DEPO-MEDROL) injection 80 mg ? ?Dorothyann Peng, NP ? ?

## 2021-06-23 DIAGNOSIS — Z23 Encounter for immunization: Secondary | ICD-10-CM | POA: Diagnosis not present

## 2021-07-18 NOTE — Progress Notes (Unsigned)
[facility] [address] [place]. [place] [address] [phone]       OFFICE FOLLOW UP NOTE  [name] Date of Birth:  [birth year] Medical Record Number:  [id number]   Referring MD: Shelva Majestic Reason for visit: Seizures   No chief complaint on file.     HPI:    Update 07/18/2021 JM: Patient returns for 1 year seizure follow-up unaccompanied.  Doing well.  Denies seizure activity.  Compliant on Vimpat 100 mg twice daily, denies side effects.  Continues to maintain ADLs and IADLs.  No new concerns at this time.      History provided for reference purposes only Update 07/18/2020 JM: [name] is being seen via video visit for 6 months seizure follow-up unaccompanied.  Reports he has been doing well since prior visit without any additional seizure events or symptoms.  Compliant on Vimpat 100 mg twice daily tolerating without side effects. He does refrain from ETOH use since his seizures.  He does report having some difficulties with arthritis of lumbar region but has been gradually improving and currently using OTC omega-3, turmeric and Voltaren gel with benefit.  No new concerns at this time.  Update 01/06/2020 JM: [name] returns for 14-monthseizure follow-up.  Remains on Vimpat 100 mg twice daily without side effects and denies any reoccurring seizure events or symptoms.  Maintains ADLs and IADLs independently as well as driving without difficulty.  No concerns at this time.  Update 07/07/2019: Returns for seizure follow-up.  He has been doing well since prior visit 6 months ago without recurrent seizure activity remains on Vimpat 100 mg twice daily tolerating well. He does report cessation of all alcohol.  Blood pressure today 135/74.  He questions safety of arthritic pain medication such as naproxen in regards to seizures and use of Vimpat and possible interaction. He does use voltaren gel and aspercream as needed at night only. No other concerns at this  time.   Initial visit 12/02/2017 ;[name] a pleasant 71year old Caucasian male who has had multiple episodes of brief loss of consciousness since April 2017.  History is obtained from the patient and review of electronic medical records.  I personally reviewed imaging films.  He actually states that the few months prior to his first episode of passing out he used to have brief episodes of particular smell or taste and he would manage to control it and prevent it from getting worse.  The first episode he passed out while he was brushing teeth he had a feeling that something were to happen before he could control it he fell down got up quickly he was out for only few seconds.  Second episode occurred when he was outside all of a sudden his wife who was standing behind him noticed that he was leaning to one side and started snorting when his wife began to wake him up he started jerking his arms and then woke up and was quite disoriented for a few minutes.  The third episode he fell in the kitchen floor on the hardwood floor and woke up in a minute.  He is felt he did not have a warning.  The wife also reports other episodes like a staring episode she weakness in the gym about 4 weeks ago when the patient was staring and was briefly unresponsive and not talking within 30 seconds he started speaking.  Patient has no prior history of known seizures significant head injury with loss of consciousness  or intracranial issues except that he had a history of ruptured subarachnoid hemorrhage in 1991 for which he underwent craniotomy and surgical clipping in [place].  He apparently did quite well from that and had no significant physical deficits or cognitive issues.  He has been seen by cardiology who have ordered an echocardiogram which was unremarkable.  Carotid ultrasound done on 11/06/2017 showed no significant extracranial stenosis.  CT scan of the head on 10/28/2017 showed old left craniotomy defect and aneurysm  clip on the left side anteriorly.  No acute abnormality.  Patient has not had an EEG.  He has not followed up with a neurologist.   It is unclear if he can have an MRI because his aneurysm clip was in 1991 and I am not sure it is MRI compatible  Update 02/11/2018 ; he returns for follow-up after last visit 2 months ago.  He states he has had no further episodes of aura or passing out or seizure-like episodes.  He is still on Keppra XR 500 mg daily and states that he felt tired and hence did not feel like he needed to increase the dose further.  He complains of occasional positional dizziness when he bends down.  He is otherwise doing well without any new symptoms.  He had EEG done on 12/31/2017 which I personally reviewed and was normal.  He had CT angiogram of the brain done on 12/11/2017 which also have reviewed shows no new aneurysms.  The previous surgical the clipped aneurysm is noted in the left cavernous distal carotid.  Patient has no new complaints today.  Update 08/19/2018: [name] is a 71 year old male who is being seen today for follow-up regarding seizures.  He reports approximately 6 times daily experiences sensation of nausea, increased elevation, balance difficulties and fatigue which lasts for approximately 1 minute.  Denies loss of consciousness, dizziness, SOB or palpitations.  Unable to associate activity with the symptoms as he can be sitting still while he experiences these.  Prior seizure episodes consist of auras and passing out but he feels as though his most recent symptoms are different from his possible seizure-like episodes.  These have been present for approximately 1 month.  He does endorse increasing Keppra XR dose as recommended at prior visit from 500 mg daily to 500 mg twice daily approximately 1 month ago.  He denies any recurrent seizure-like episodes.  Blood pressure stable 127/72.  Heart rate 52.  He does not check vitals during or after these episodes.  Update 12/31/2018:  [name] is a 71 year old male who is being seen today for seizure follow-up.  Due to possible continued seizure activity, Keppra dosage increased after prior visit.  Initially doing well with increasing dose but then started experiencing more frequent episodes as described above therefore recommend transitioning to Lamictal.  He unfortunately experienced frequent episodes of diarrhea therefore transitioned to Vimpat.  He is currently on 100 mg twice daily which he has been tolerating well without any side effects or reoccurring seizure or seizure type activity.  He does report yesterday having a 30 second episode of vertigo but has not experienced vertigo symptoms since and denies any recent vertigo prior to yesterday.  He did check blood pressure at that time and was elevated for him at 133/25.  Blood pressure at today's visit 118/64.  He does report recent weight loss with daily exercise and eating healthy.  No further concerns at this time.       ROS:   14  system review of systems is positive for those listed in HPI and all other systems negative  PMH:  Past Medical History:  Diagnosis Date   Arthritis    Brain aneurysm    Chicken pox    GERD (gastroesophageal reflux disease)    Melanoma ([place])    Stroke [facility])     Social History:  Social History   Socioeconomic History   Marital status: Married    Spouse name: Not on file   Number of children: 1   Years of education: 16   Highest education level: Bachelor's degree (e.g., BA, AB, BS)  Occupational History   Occupation: Retired     Comment: Geophysicist/field seismologist for [employer]   Smoking status: Never   Smokeless tobacco: Never  Substance and Sexual Activity   Alcohol use: Yes    Comment: 4 oz daily   Drug use: No   Sexual activity: Yes  Other Topics Concern   Not on file  Social History Narrative   Born and raised in [place]. Currently resided in a private residence with his wife. Denies pets.    Fun: Golf and likes to  swim / yard-work   Denies religious beliefs effecting healthcare.   One Child    Two grandchildren       Social Determinants of Health   Financial Resource Strain: Low Risk  (04/25/2021)   Overall Financial Resource Strain (CARDIA)    Difficulty of Paying Living Expenses: Not hard at all  Food Insecurity: No Food Insecurity (04/25/2021)   Hunger Vital Sign    Worried About Running Out of Food in the Last Year: Never true    Ran Out of Food in the Last Year: Never true  Transportation Needs: No Transportation Needs (04/25/2021)   PRAPARE - Hydrologist (Medical): No    Lack of Transportation (Non-Medical): No  Physical Activity: Sufficiently Active (04/25/2021)   Exercise Vital Sign    Days of Exercise per Week: 4 days    Minutes of Exercise per Session: 110 min  Stress: No Stress Concern Present (04/25/2021)   Ray    Feeling of Stress : Only a little  Social Connections: Moderately Integrated (04/25/2021)   Social Connection and Isolation Panel [NHANES]    Frequency of Communication with Friends and Family: Twice a week    Frequency of Social Gatherings with Friends and Family: Once a week    Attends Religious Services: Never    Marine scientist or Organizations: No    Attends Music therapist: More than 4 times per year    Marital Status: Married  Human resources officer Violence: Not At Risk (01/03/2021)   Humiliation, Afraid, Rape, and Kick questionnaire    Fear of Current or Ex-Partner: No    Emotionally Abused: No    Physically Abused: No    Sexually Abused: No    Medications:   Current Outpatient Medications on File Prior to Visit  Medication Sig Dispense Refill   Black Pepper-Turmeric 3-500 MG CAPS      diclofenac Sodium (VOLTAREN) 1 % GEL Apply 2 g topically 4 (four) times daily as needed. Apply topically 4 (four) times daily as needed. 150 g 6   Multiple  Vitamins-Iron (MULTIVITAMINS WITH IRON) TABS tablet Take 1 tablet by mouth daily.     Omega-3 Fatty Acids (FISH OIL) 1200 MG CPDR      rosuvastatin (CRESTOR) 10 MG  tablet Take 1 tablet (10 mg total) by mouth daily. Please schedule appt for future refills. 1st attempt 90 tablet 0   TURMERIC PO Take by mouth.     VIMPAT 100 MG TABS TAKE ONE TABLET BY MOUTH EVERY MORNING AND TAKE ONE TABLET BY MOUTH EVERY NIGHT AT BEDTIME 180 tablet 1   No current facility-administered medications on file prior to visit.    Allergies:  No Active Allergies  Physical Exam There were no vitals filed for this visit. There is no height or weight on file to calculate BMI.  General: well developed, well nourished, very pleasant elderly Caucasian male, seated, in no evident distress Head: head normocephalic and atraumatic.   Neck: supple with no carotid or supraclavicular bruits Cardiovascular: regular rate and rhythm, no murmurs Musculoskeletal: no deformity Skin:  no rash/petichiae Vascular:  Normal pulses all extremities   Neurologic Exam Mental Status: Awake and fully alert. Oriented to place and time. Recent and remote memory intact. Attention span, concentration and fund of knowledge appropriate. Mood and affect appropriate.  Cranial Nerves: Fundoscopic exam reveals sharp disc margins. Pupils equal, briskly reactive to light. Extraocular movements full without nystagmus. Visual fields full to confrontation. Hearing intact. Facial sensation intact. Face, tongue, palate moves normally and symmetrically.  Motor: Normal bulk and tone. Normal strength in all tested extremity muscles Sensory.: intact to touch , pinprick , position and vibratory sensation.  Coordination: Rapid alternating movements normal in all extremities. Finger-to-nose and heel-to-shin performed accurately bilaterally. Gait and Station: Arises from chair without difficulty. Stance is normal. Gait demonstrates normal stride length and balance  ***. Tandem walk and heel toe ***.  Reflexes: 1+ and symmetric. Toes downgoing.           ASSESSMENT: 71 year male with seizure-like episodes since 2019 consisting of brief loss of consciousness as well as brief episodes of aura possibly complex partial seizures with and without secondary generalization.  Remote history of subarachnoid hemorrhage status post surgical aneurysm clipping in 1991and seizures may be symptomatic late effect  from this.  Initially placed on Keppra with complaints of worsening symptoms and unable to tolerate Lamictal therefore currently on Vimpat tolerating well without recurrent seizure activity.      PLAN: -Continuation of Vimpat 100 mg twice daily for seizure prevention. Needs  Refill up-to-date -Discussed avoidance of seizure triggers and to call office with any seizure events or symptoms -Continue to follow with PCP for lumbar back pain likely in setting of arthritis     Follow-up in 1 year or call earlier if needed   CC:  Nafziger, Tommi Rumps, NP    I spent 22 minutes of face-to-face and non-face-to-face time with patient.  This included previsit chart review, order entry, electronic health record documentation, and patient education and discussion regarding history of seizures, ongoing use of Vimpat, risk vs benefit of continuing AED vs tapering off, seizure triggers and importance of avoidance, acute on chronic lower back pain and answered all other questions to patient satisfaction  Frann Rider, AGNP-BC  [facility] [address] [place], [place] [address]  Phone [phone] Fax [phone] Note: This document was prepared with digital dictation and possible smart phrase technology. Any transcriptional errors that result from this process are unintentional.

## 2021-07-19 ENCOUNTER — Encounter: Payer: Self-pay | Admitting: Adult Health

## 2021-07-19 ENCOUNTER — Ambulatory Visit (INDEPENDENT_AMBULATORY_CARE_PROVIDER_SITE_OTHER): Payer: Medicare Other | Admitting: Adult Health

## 2021-07-19 VITALS — BP 113/61 | HR 61 | Ht 71.0 in | Wt 195.0 lb

## 2021-07-19 DIAGNOSIS — G40209 Localization-related (focal) (partial) symptomatic epilepsy and epileptic syndromes with complex partial seizures, not intractable, without status epilepticus: Secondary | ICD-10-CM

## 2021-07-19 NOTE — Patient Instructions (Addendum)
Your Plan:  Continue Vimpat 100 mg twice daily - refill is good through October - please call around September to request updated refill to ensure you have this filled in time  Please call with any seizure activity or concerns    Follow-up in 1 year or call earlier if needed     Thank you for coming to see [place] at [facility]. I hope we have been able to provide you high quality care today.  You may receive a patient satisfaction survey over the next few weeks. We would appreciate your feedback and comments so that we may continue to improve ourselves and the health of our patients.

## 2021-07-25 DIAGNOSIS — L218 Other seborrheic dermatitis: Secondary | ICD-10-CM | POA: Diagnosis not present

## 2021-07-25 DIAGNOSIS — L570 Actinic keratosis: Secondary | ICD-10-CM | POA: Diagnosis not present

## 2021-07-25 DIAGNOSIS — D485 Neoplasm of uncertain behavior of skin: Secondary | ICD-10-CM | POA: Diagnosis not present

## 2021-07-25 DIAGNOSIS — Z85820 Personal history of malignant melanoma of skin: Secondary | ICD-10-CM | POA: Diagnosis not present

## 2021-07-25 DIAGNOSIS — C44719 Basal cell carcinoma of skin of left lower limb, including hip: Secondary | ICD-10-CM | POA: Diagnosis not present

## 2021-07-25 DIAGNOSIS — D225 Melanocytic nevi of trunk: Secondary | ICD-10-CM | POA: Diagnosis not present

## 2021-07-25 DIAGNOSIS — Z85828 Personal history of other malignant neoplasm of skin: Secondary | ICD-10-CM | POA: Diagnosis not present

## 2021-07-25 DIAGNOSIS — L821 Other seborrheic keratosis: Secondary | ICD-10-CM | POA: Diagnosis not present

## 2021-08-06 ENCOUNTER — Other Ambulatory Visit: Payer: Self-pay | Admitting: Cardiovascular Disease

## 2021-08-21 ENCOUNTER — Encounter: Payer: Self-pay | Admitting: Family Medicine

## 2021-08-21 ENCOUNTER — Ambulatory Visit (INDEPENDENT_AMBULATORY_CARE_PROVIDER_SITE_OTHER): Payer: Medicare Other | Admitting: Family Medicine

## 2021-08-21 VITALS — BP 120/60 | HR 63 | Temp 98.0°F | Ht 71.0 in | Wt 199.2 lb

## 2021-08-21 DIAGNOSIS — M25462 Effusion, left knee: Secondary | ICD-10-CM

## 2021-08-21 NOTE — Patient Instructions (Signed)
Elevate leg/knee frequently  Try some icing 20 minutes 2-3 times daily  Continue with the Diclofenac gel.    Consider elastic knee sleeve for support  Let me know if not improving in 2-3 weeks  ?meniscus injury.    Would consider sports medicine referral if no better.

## 2021-08-21 NOTE — Progress Notes (Signed)
Established Patient Office Visit  Subjective   Patient ID: James Myers, male    DOB: [birth year]  Age: 71 y.o. MRN: [id number]  Chief Complaint  Patient presents with   Joint Swelling    Patient complains of left knee swelling, x1 week, Patient denies any known injury     HPI   Seen with 1 week history of left knee swelling.  No prior history of similar issue.  No known injury.  He does think he may have irritated this getting out of a vehicle.  He has tried some topical Voltaren gel and occasional Aleve with some relief.  Pain is more lateral and posterior.  Some pain with going up and down stairs.  Has noted slight warmth.  No erythema.  No history of gout or pseudogout.  He does have osteoarthritis involving both hands.  No significant pain at rest.  No sense of instability.  No locking.  No calf pain.  No chest pain.  Past Medical History:  Diagnosis Date   Arthritis    Brain aneurysm    Chicken pox    GERD (gastroesophageal reflux disease)    Melanoma ([place])    Stroke [facility])    Past Surgical History:  Procedure Laterality Date   BRAIN SURGERY  1991   TONSILLECTOMY AND ADENOIDECTOMY      reports that he has never smoked. He has never used smokeless tobacco. He reports current alcohol use. He reports that he does not use drugs. family history includes Aneurysm in his mother; Arthritis in his father, maternal grandmother, mother, and paternal grandmother; Diabetes in his maternal grandfather; Parkinson's disease in his mother; Stroke in his father; Syncope episode in his father; Tremor in his brother. No Active Allergies  Review of Systems  Respiratory:  Negative for shortness of breath.   Cardiovascular:  Negative for chest pain.  Neurological:  Negative for weakness.      Objective:     BP 120/60 (BP Location: Left Arm, Patient Position: Sitting, Cuff Size: Normal)   Pulse 63   Temp 98 F (36.7 C) (Oral)   Ht '5\' 11"'$  (1.803 m)   Wt 199 lb 3.2 oz (90.4 kg)   SpO2  99%   BMI 27.78 kg/m    Physical Exam Vitals reviewed.  Cardiovascular:     Rate and Rhythm: Normal rate.  Musculoskeletal:     Comments: Left knee reveals some obvious edema.  He has mild to moderate effusion.  Slight warmth left knee compared to the right.  No erythema.  No ecchymosis.  No localized tenderness.  No popliteal swelling.  Anterior and posterior cruciate ligament testing normal as well as collateral ligament testing.  No calf tenderness.  Neurological:     Mental Status: He is alert.      No results found for any visits on 08/21/21.    The 10-year ASCVD risk score (Arnett DK, et al., 2019) is: 15%    Assessment & Plan:   Acute left knee effusion and pain.  Question meniscus injury posterior lateral meniscus.  No history of gout or pseudogout.  No reported injury.  Possibly injured getting out of vehicle with foot planted and twisting knee.  -Caution with nonsteroidals -Recommend icing 20 to 30 minutes 2-3 times daily -Consider elastic knee sleeve -Continue topical diclofenac -Touch base if not improving over the next 2 to 3 weeks.  We offered sports medicine referral particularly if not improving  No follow-ups on file.    Carolann Littler,  MD

## 2021-08-22 ENCOUNTER — Ambulatory Visit: Payer: BC Managed Care – PPO | Admitting: Adult Health

## 2021-08-23 ENCOUNTER — Ambulatory Visit: Payer: BC Managed Care – PPO | Admitting: Adult Health

## 2021-08-24 ENCOUNTER — Encounter: Payer: Self-pay | Admitting: Adult Health

## 2021-08-24 ENCOUNTER — Encounter: Payer: Self-pay | Admitting: Family Medicine

## 2021-08-24 NOTE — Telephone Encounter (Signed)
FYI

## 2021-08-31 ENCOUNTER — Ambulatory Visit: Payer: Medicare Other | Admitting: Adult Health

## 2021-09-05 ENCOUNTER — Other Ambulatory Visit: Payer: Self-pay | Admitting: Cardiovascular Disease

## 2021-09-05 ENCOUNTER — Other Ambulatory Visit: Payer: Self-pay | Admitting: Adult Health

## 2021-09-05 ENCOUNTER — Encounter: Payer: Self-pay | Admitting: *Deleted

## 2021-09-05 DIAGNOSIS — G40209 Localization-related (focal) (partial) symptomatic epilepsy and epileptic syndromes with complex partial seizures, not intractable, without status epilepticus: Secondary | ICD-10-CM

## 2021-10-01 ENCOUNTER — Other Ambulatory Visit: Payer: Self-pay | Admitting: Cardiovascular Disease

## 2021-10-19 DIAGNOSIS — Z23 Encounter for immunization: Secondary | ICD-10-CM | POA: Diagnosis not present

## 2021-11-01 ENCOUNTER — Ambulatory Visit: Payer: BC Managed Care – PPO

## 2021-11-01 ENCOUNTER — Other Ambulatory Visit: Payer: Self-pay | Admitting: Cardiovascular Disease

## 2021-11-09 ENCOUNTER — Telehealth: Payer: Self-pay | Admitting: *Deleted

## 2021-11-09 ENCOUNTER — Encounter: Payer: Self-pay | Admitting: Adult Health

## 2021-11-09 NOTE — Telephone Encounter (Signed)
Vimpat brand  100 mg PA, Key: [id number], faxed office notes to be attached to key. Received confirmation of fax.

## 2021-11-13 NOTE — Telephone Encounter (Signed)
Vimpat approved. Effective from 11/13/2021 through 11/12/2022.

## 2021-11-13 NOTE — Telephone Encounter (Signed)
Received e mail: documents attached. Your information has been submitted to [employer]. [employer] will review the request and notify you of the determination decision directly, typically within 72 hours of receiving all information. If [employer] has not responded within the specified timeframe or if you have any questions about your PA submission, contact [place]  directly at [phone].

## 2021-11-28 DIAGNOSIS — Z85820 Personal history of malignant melanoma of skin: Secondary | ICD-10-CM | POA: Diagnosis not present

## 2021-11-28 DIAGNOSIS — L812 Freckles: Secondary | ICD-10-CM | POA: Diagnosis not present

## 2021-11-28 DIAGNOSIS — L821 Other seborrheic keratosis: Secondary | ICD-10-CM | POA: Diagnosis not present

## 2021-11-28 DIAGNOSIS — Z85828 Personal history of other malignant neoplasm of skin: Secondary | ICD-10-CM | POA: Diagnosis not present

## 2021-11-28 DIAGNOSIS — L245 Irritant contact dermatitis due to other chemical products: Secondary | ICD-10-CM | POA: Diagnosis not present

## 2021-11-28 DIAGNOSIS — L570 Actinic keratosis: Secondary | ICD-10-CM | POA: Diagnosis not present

## 2022-01-04 ENCOUNTER — Ambulatory Visit (INDEPENDENT_AMBULATORY_CARE_PROVIDER_SITE_OTHER): Payer: Medicare Other

## 2022-01-04 VITALS — BP 122/62 | HR 60 | Temp 97.7°F | Ht 71.0 in | Wt 197.8 lb

## 2022-01-04 DIAGNOSIS — Z0000 Encounter for general adult medical examination without abnormal findings: Secondary | ICD-10-CM | POA: Diagnosis not present

## 2022-01-04 NOTE — Progress Notes (Signed)
Subjective:   James Myers is a 71 y.o. male who presents for Medicare Annual/Subsequent preventive examination.  Review of Systems      Cardiac Risk Factors include: advanced age (>62mn, >>105women);male gender     Objective:    Today's Vitals   01/04/22 1345  BP: 122/62  Pulse: 60  Temp: 97.7 F (36.5 C)  TempSrc: Oral  SpO2: 97%  Weight: 197 lb 12.8 oz (89.7 kg)  Height: '5\' 11"'$  (1.803 m)   Body mass index is 27.59 kg/m.     01/04/2022    1:52 PM 01/03/2021    1:58 PM 11/18/2019    9:55 AM 12/28/2015    2:39 PM  Advanced Directives  Does Patient Have a Medical Advance Directive? Yes Yes Yes Yes  Type of AParamedicof [place]Living will [place]Living will  [place] will  Does patient want to make changes to medical advance directive? No - Patient declined No - Patient declined Yes (MAU/Ambulatory/Procedural Areas - Information given)   Copy of [place] Chart? Yes - validated most recent copy scanned in chart (See row information) No - copy requested      Current Medications (verified) Outpatient Encounter Medications as of 01/04/2022  Medication Sig   Black Pepper-Turmeric 3-500 MG CAPS    diclofenac Sodium (VOLTAREN) 1 % GEL Apply 2 g topically 4 (four) times daily as needed. Apply topically 4 (four) times daily as needed.   Multiple Vitamins-Iron (MULTIVITAMINS WITH IRON) TABS tablet Take 1 tablet by mouth daily.   Omega-3 Fatty Acids (FISH OIL) 1200 MG CPDR    rosuvastatin (CRESTOR) 10 MG tablet TAKE 1 TABLET BY MOUTH DAILY   TURMERIC PO Take by mouth.   VIMPAT 100 MG TABS TAKE ONE TABLET BY MOUTH EVERY MORNING AND TAKE ONE TABLET BY MOUTH AT BEDTIME   No facility-administered encounter medications on file as of 01/04/2022.    Allergies (verified) Patient has no active allergies.   History: Past Medical History:  Diagnosis Date   Arthritis    Brain aneurysm     Chicken pox    GERD (gastroesophageal reflux disease)    Melanoma (HCC)    Stroke ([facility]    Past Surgical History:  Procedure Laterality Date   BRAIN SURGERY  1991   TONSILLECTOMY AND ADENOIDECTOMY     Family History  Problem Relation Age of Onset   Aneurysm Mother        brain    Arthritis Mother    Parkinson's disease Mother    Stroke Father    Arthritis Father    Syncope episode Father    Arthritis Maternal Grandmother    Diabetes Maternal Grandfather    Arthritis Paternal Grandmother    Tremor Brother    Social History   Socioeconomic History   Marital status: Married    Spouse name: Not on file   Number of children: 1   Years of education: 16   Highest education level: Bachelor's degree (e.g., BA, AB, BS)  Occupational History   Occupation: Retired     Comment: LGeophysicist/field seismologistfor [employer]   Smoking status: Never   Smokeless tobacco: Never  Substance and Sexual Activity   Alcohol use: Yes    Comment: 4 oz daily   Drug use: No   Sexual activity: Yes  Other Topics Concern   Not on file  Social History Narrative   Born and raised in  [place]. Currently resided in a private residence with his wife. Denies pets.    Fun: Golf and likes to swim / yard-work   Denies religious beliefs effecting healthcare.   One Child    Two grandchildren       Social Determinants of Health   Financial Resource Strain: Low Risk  (01/04/2022)   Overall Financial Resource Strain (CARDIA)    Difficulty of Paying Living Expenses: Not hard at all  Food Insecurity: No Food Insecurity (01/04/2022)   Hunger Vital Sign    Worried About Running Out of Food in the Last Year: Never true    Ran Out of Food in the Last Year: Never true  Transportation Needs: No Transportation Needs (01/04/2022)   PRAPARE - Hydrologist (Medical): No    Lack of Transportation (Non-Medical): No  Physical Activity: Sufficiently Active (01/04/2022)   Exercise Vital Sign     Days of Exercise per Week: 4 days    Minutes of Exercise per Session: 60 min  Stress: No Stress Concern Present (01/04/2022)   [place]    Feeling of Stress : Not at all  Social Connections: [place] (01/04/2022)   Social Connection and Isolation Panel [NHANES]    Frequency of Communication with Friends and Family: More than three times a week    Frequency of Social Gatherings with Friends and Family: More than three times a week    Attends Religious Services: More than 4 times per year    Active Member of [employer] or Organizations: Yes    Attends Music therapist: More than 4 times per year    Marital Status: Married    Tobacco Counseling Counseling given: Not Answered   Clinical Intake:  Pre-visit preparation completed: Yes  Pain : No/denies pain     BMI - recorded: 27.59 Nutritional Status: BMI 25 -29 Overweight Nutritional Risks: None Diabetes: No  How often do you need to have someone help you when you read instructions, pamphlets, or other written materials from your doctor or pharmacy?: 1 - Never  Diabetic?  No  Interpreter Needed?: No  Information entered by :: Rolene Arbour LPN   Activities of Daily Living    01/04/2022    1:51 PM 01/03/2022    4:51 PM  In your present state of health, do you have any difficulty performing the following activities:  Hearing? 0 0  Vision? 0 0  Difficulty concentrating or making decisions? 0 0  Walking or climbing stairs? 0 0  Dressing or bathing? 0 0  Doing errands, shopping? 0 0  Preparing Food and eating ? N N  Using the Toilet? N N  In the past six months, have you accidently leaked urine? N N  Do you have problems with loss of bowel control? N N  Managing your Medications? N N  Managing your Finances? N N  Housekeeping or managing your Housekeeping? N N    Patient Care Team: Dorothyann Peng, NP as PCP - General (Family  Medicine) Troy Sine, MD as PCP - Cardiology (Cardiology)  Indicate any recent Medical Services you may have received from other than [facility] providers in the past year (date may be approximate).     Assessment:   This is a routine wellness examination for James Myers.  Hearing/Vision screen Hearing Screening - Comments:: Denies hearing difficulties   Vision Screening - Comments:: Wears rx glasses - up to date with routine eye  exams with  Dr Katy Fitch  Dietary issues and exercise activities discussed: Current Exercise Habits: Home exercise routine, Type of exercise: walking, Time (Minutes): 60, Frequency (Times/Week): 4, Weekly Exercise (Minutes/Week): 240, Intensity: Moderate, Exercise limited by: None identified   Goals Addressed               This Visit's Progress     Stay Healthy (pt-stated)         Depression Screen    01/04/2022    1:50 PM 01/03/2021    1:54 PM 11/24/2020    8:01 AM 11/18/2019   10:00 AM  PHQ 2/9 Scores  PHQ - 2 Score 0 0 0 0  PHQ- 9 Score    0    Fall Risk    01/04/2022    1:52 PM 01/03/2022    4:51 PM 08/21/2021   11:17 AM 04/25/2021    4:56 PM 01/03/2021    1:57 PM  Fall Risk   Falls in the past year? 1 1 0 1 0  Number falls in past yr: 0 0 0 0 0  Injury with Fall? 0 0 0 1 0  Risk for fall due to : No Fall Risks  No Fall Risks    Follow up Falls prevention discussed  Falls evaluation completed      [place]:  Any stairs in or around the home? Yes  If so, are there any without handrails? No  Home free of loose throw rugs in walkways, pet beds, electrical cords, etc? Yes  Adequate lighting in your home to reduce risk of falls? Yes   ASSISTIVE DEVICES UTILIZED TO PREVENT FALLS:  Life alert? No  Use of a cane, walker or w/c? No  Grab bars in the bathroom? No  Shower chair or bench in shower? No  Elevated toilet seat or a handicapped toilet? No   TIMED UP AND GO:  Was the test performed? Yes .  Length of  time to ambulate 10 feet: 10 sec.   Gait steady and fast without use of assistive device  Cognitive Function:        01/04/2022    1:53 PM 01/03/2021    1:59 PM 11/18/2019   10:04 AM  6CIT Screen  What Year? 0 points 0 points 0 points  What month? 0 points 0 points 0 points  What time? 0 points 0 points 0 points  Count back from 20 0 points 0 points 0 points  Months in reverse 0 points 0 points 0 points  Repeat phrase 0 points 0 points 0 points  Total Score 0 points 0 points 0 points    Immunizations Immunization History  Administered Date(s) Administered   Fluad Quad(high Dose 65+) 10/06/2019   Influenza, High Dose Seasonal PF 12/09/2015, 10/24/2018   Influenza-Unspecified 10/24/2018, 11/03/2020   PFIZER Comirnaty(Gray Top)Covid-19 Tri-Sucrose Vaccine 06/23/2020, 11/03/2020   PFIZER(Purple Top)SARS-COV-2 Vaccination 02/18/2019, 03/11/2019, 11/05/2019, 11/03/2020   Pneumococcal Conjugate-13 07/22/2017   Pneumococcal Polysaccharide-23 12/01/2013, 11/18/2019   Zoster Recombinat (Shingrix) 10/24/2018, 01/13/2019    TDAP status: Due, Education has been provided regarding the importance of this vaccine. Advised may receive this vaccine at local pharmacy or Health Dept. Aware to provide a copy of the vaccination record if obtained from local pharmacy or Health Dept. Verbalized acceptance and understanding.  Flu Vaccine status: Up to date  Pneumococcal vaccine status: Up to date  Covid-19 vaccine status: Completed vaccines  Qualifies for Shingles Vaccine? Yes   Zostavax completed  Yes   Shingrix Completed?: Yes  Screening Tests Health Maintenance  Topic Date Due   DTaP/Tdap/Td (1 - Tdap) Never done   COVID-19 Vaccine (7 - 2023-24 season) 01/20/2022 (Originally 09/29/2021)   INFLUENZA VACCINE  04/29/2022 (Originally 08/29/2021)   COLONOSCOPY (Pts 45-52yr Insurance coverage will need to be confirmed)  01/05/2023 (Originally 06/07/2020)   Medicare Annual Wellness (AWV)   01/05/2023   Pneumonia Vaccine 71 Years old  Completed   Hepatitis C Screening  Completed   Zoster Vaccines- Shingrix  Completed   HPV VACCINES  Aged Out    Health Maintenance  Health Maintenance Due  Topic Date Due   DTaP/Tdap/Td (1 - Tdap) Never done    Colorectal cancer screening: Referral to GI placed Patient deferred. Pt aware the office will call re: appt.  Lung Cancer Screening: (Low Dose CT Chest recommended if Age 71-80years, 30 pack-year currently smoking OR have quit w/in 15years.) does not qualify.     Additional Screening:  Hepatitis C Screening: does qualify; Completed 11/24/20  Vision Screening: Recommended annual ophthalmology exams for early detection of glaucoma and other disorders of the eye. Is the patient up to date with their annual eye exam?  Yes  Who is the provider or what is the name of the office in which the patient attends annual eye exams? Dr GKaty FitchIf pt is not established with a provider, would they like to be referred to a provider to establish care? No .   Dental Screening: Recommended annual dental exams for proper oral hygiene  Community Resource Referral / Chronic Care Management:  CRR required this visit?  No   CCM required this visit?  No       Plan:     I have personally reviewed and noted the following in the patient's chart:   Medical and social history Use of alcohol, tobacco or illicit drugs  Current medications and supplements including opioid prescriptions. Patient is not currently taking opioid prescriptions. Functional ability and status Nutritional status Physical activity Advanced directives List of other physicians Hospitalizations, surgeries, and ER visits in previous 12 months Vitals Screenings to include cognitive, depression, and falls Referrals and appointments  In addition, I have reviewed and discussed with patient certain preventive protocols, quality metrics, and best practice recommendations. A  written personalized care plan for preventive services as well as general preventive health recommendations were provided to patient.     BCriselda Peaches LPN   [id number]  Nurse Notes: Patient due DTaP/Tdap/Td(1-dap)

## 2022-01-04 NOTE — Patient Instructions (Addendum)
James Myers , Thank you for taking time to come for your Medicare Wellness Visit. I appreciate your ongoing commitment to your health goals. Please review the following plan we discussed and let me know if I can assist you in the future.   These are the goals we discussed:  Goals       Exercise 150 min/wk Moderate Activity      Stay Healthy (pt-stated)        This is a list of the screening recommended for you and due dates:  Health Maintenance  Topic Date Due   DTaP/Tdap/Td vaccine (1 - Tdap) Never done   COVID-19 Vaccine (7 - 2023-24 season) 01/20/2022*   Flu Shot  04/29/2022*   Colon Cancer Screening  01/05/2023*   Medicare Annual Wellness Visit  01/05/2023   Pneumonia Vaccine  Completed   Hepatitis C Screening: USPSTF Recommendation to screen - Ages 18-79 yo.  Completed   Zoster (Shingles) Vaccine  Completed   HPV Vaccine  Aged Out  *Topic was postponed. The date shown is not the original due date.    Advanced directives:  In Chart  Conditions/risks identified: None  Next appointment: Follow up in one year for your annual wellness visit.    Preventive Care 38 Years and Older, Male  Preventive care refers to lifestyle choices and visits with your health care provider that can promote health and wellness. What does preventive care include? A yearly physical exam. This is also called an annual well check. Dental exams once or twice a year. Routine eye exams. Ask your health care provider how often you should have your eyes checked. Personal lifestyle choices, including: Daily care of your teeth and gums. Regular physical activity. Eating a healthy diet. Avoiding tobacco and drug use. Limiting alcohol use. Practicing safe sex. Taking low doses of aspirin every day. Taking vitamin and mineral supplements as recommended by your health care provider. What happens during an annual well check? The services and screenings done by your health care provider during your annual  well check will depend on your age, overall health, lifestyle risk factors, and family history of disease. Counseling  Your health care provider may ask you questions about your: Alcohol use. Tobacco use. Drug use. Emotional well-being. Home and relationship well-being. Sexual activity. Eating habits. History of falls. Memory and ability to understand (cognition). Work and work Statistician. Screening  You may have the following tests or measurements: Height, weight, and BMI. Blood pressure. Lipid and cholesterol levels. These may be checked every 5 years, or more frequently if you are over 6 years old. Skin check. Lung cancer screening. You may have this screening every year starting at age 42 if you have a 30-pack-year history of smoking and currently smoke or have quit within the past 15 years. Fecal occult blood test (FOBT) of the stool. You may have this test every year starting at age 28. Flexible sigmoidoscopy or colonoscopy. You may have a sigmoidoscopy every 5 years or a colonoscopy every 10 years starting at age 29. Prostate cancer screening. Recommendations will vary depending on your family history and other risks. Hepatitis C blood test. Hepatitis B blood test. Sexually transmitted disease (STD) testing. Diabetes screening. This is done by checking your blood sugar (glucose) after you have not eaten for a while (fasting). You may have this done every 1-3 years. Abdominal aortic aneurysm (AAA) screening. You may need this if you are a current or former smoker. Osteoporosis. You may be screened starting at  age 72 if you are at high risk. Talk with your health care provider about your test results, treatment options, and if necessary, the need for more tests. Vaccines  Your health care provider may recommend certain vaccines, such as: Influenza vaccine. This is recommended every year. Tetanus, diphtheria, and acellular pertussis (Tdap, Td) vaccine. You may need a Td booster  every 10 years. Zoster vaccine. You may need this after age 51. Pneumococcal 13-valent conjugate (PCV13) vaccine. One dose is recommended after age 85. Pneumococcal polysaccharide (PPSV23) vaccine. One dose is recommended after age 5. Talk to your health care provider about which screenings and vaccines you need and how often you need them. This information is not intended to replace advice given to you by your health care provider. Make sure you discuss any questions you have with your health care provider. Document Released: 02/11/2015 Document Revised: 10/05/2015 Document Reviewed: 11/16/2014 Elsevier Interactive Patient Education  2017 [place] Prevention in the Home Falls can cause injuries. They can happen to people of all ages. There are many things you can do to make your home safe and to help prevent falls. What can I do on the outside of my home? Regularly fix the edges of walkways and driveways and fix any cracks. Remove anything that might make you trip as you walk through a door, such as a raised step or threshold. Trim any bushes or trees on the path to your home. Use bright outdoor lighting. Clear any walking paths of anything that might make someone trip, such as rocks or tools. Regularly check to see if handrails are loose or broken. Make sure that both sides of any steps have handrails. Any raised decks and porches should have guardrails on the edges. Have any leaves, snow, or ice cleared regularly. Use sand or salt on walking paths during winter. Clean up any spills in your garage right away. This includes oil or grease spills. What can I do in the bathroom? Use night lights. Install grab bars by the toilet and in the tub and shower. Do not use towel bars as grab bars. Use non-skid mats or decals in the tub or shower. If you need to sit down in the shower, use a plastic, non-slip stool. Keep the floor dry. Clean up any water that spills on the floor as soon  as it happens. Remove soap buildup in the tub or shower regularly. Attach bath mats securely with double-sided non-slip rug tape. Do not have throw rugs and other things on the floor that can make you trip. What can I do in the bedroom? Use night lights. Make sure that you have a light by your bed that is easy to reach. Do not use any sheets or blankets that are too big for your bed. They should not hang down onto the floor. Have a firm chair that has side arms. You can use this for support while you get dressed. Do not have throw rugs and other things on the floor that can make you trip. What can I do in the kitchen? Clean up any spills right away. Avoid walking on wet floors. Keep items that you use a lot in easy-to-reach places. If you need to reach something above you, use a strong step stool that has a grab bar. Keep electrical cords out of the way. Do not use floor polish or wax that makes floors slippery. If you must use wax, use non-skid floor wax. Do not have throw rugs and  other things on the floor that can make you trip. What can I do with my stairs? Do not leave any items on the stairs. Make sure that there are handrails on both sides of the stairs and use them. Fix handrails that are broken or loose. Make sure that handrails are as long as the stairways. Check any carpeting to make sure that it is firmly attached to the stairs. Fix any carpet that is loose or worn. Avoid having throw rugs at the top or bottom of the stairs. If you do have throw rugs, attach them to the floor with carpet tape. Make sure that you have a light switch at the top of the stairs and the bottom of the stairs. If you do not have them, ask someone to add them for you. What else can I do to help prevent falls? Wear shoes that: Do not have high heels. Have rubber bottoms. Are comfortable and fit you well. Are closed at the toe. Do not wear sandals. If you use a stepladder: Make sure that it is fully  opened. Do not climb a closed stepladder. Make sure that both sides of the stepladder are locked into place. Ask someone to hold it for you, if possible. Clearly mark and make sure that you can see: Any grab bars or handrails. First and last steps. Where the edge of each step is. Use tools that help you move around (mobility aids) if they are needed. These include: Canes. Walkers. Scooters. Crutches. Turn on the lights when you go into a dark area. Replace any light bulbs as soon as they burn out. Set up your furniture so you have a clear path. Avoid moving your furniture around. If any of your floors are uneven, fix them. If there are any pets around you, be aware of where they are. Review your medicines with your doctor. Some medicines can make you feel dizzy. This can increase your chance of falling. Ask your doctor what other things that you can do to help prevent falls. This information is not intended to replace advice given to you by your health care provider. Make sure you discuss any questions you have with your health care provider. Document Released: 11/11/2008 Document Revised: 06/23/2015 Document Reviewed: 02/19/2014 Elsevier Interactive Patient Education  2017 [employer].

## 2022-01-08 ENCOUNTER — Encounter (INDEPENDENT_AMBULATORY_CARE_PROVIDER_SITE_OTHER): Payer: Medicare Other | Admitting: Ophthalmology

## 2022-01-08 ENCOUNTER — Encounter (INDEPENDENT_AMBULATORY_CARE_PROVIDER_SITE_OTHER): Payer: Self-pay

## 2022-01-08 DIAGNOSIS — D3131 Benign neoplasm of right choroid: Secondary | ICD-10-CM | POA: Diagnosis not present

## 2022-01-08 DIAGNOSIS — H2513 Age-related nuclear cataract, bilateral: Secondary | ICD-10-CM | POA: Diagnosis not present

## 2022-01-29 ENCOUNTER — Other Ambulatory Visit: Payer: Self-pay | Admitting: Cardiovascular Disease

## 2022-02-06 ENCOUNTER — Ambulatory Visit: Payer: Medicare Other | Admitting: Adult Health

## 2022-02-06 VITALS — BP 120/70 | HR 59 | Temp 98.3°F | Ht 71.0 in | Wt 198.0 lb

## 2022-02-06 DIAGNOSIS — G8929 Other chronic pain: Secondary | ICD-10-CM

## 2022-02-06 DIAGNOSIS — M25512 Pain in left shoulder: Secondary | ICD-10-CM | POA: Diagnosis not present

## 2022-02-06 MED ORDER — METHYLPREDNISOLONE ACETATE 80 MG/ML IJ SUSP
80.0000 mg | Freq: Once | INTRAMUSCULAR | Status: AC
  Administered 2022-02-06: 80 mg via INTRA_ARTICULAR

## 2022-02-06 NOTE — Patient Instructions (Signed)
Health Maintenance Due  Topic Date Due   DTaP/Tdap/Td (1 - Tdap) Never done   COVID-19 Vaccine (8 - 2023-24 season) 09/29/2021      Row Labels 01/04/2022    1:50 PM 01/03/2021    1:54 PM 11/24/2020    8:01 AM  Depression screen PHQ 2/9   Section Header. No data exists in this row.     Decreased Interest   0 0 0  Down, Depressed, Hopeless   0 0 0  PHQ - 2 Score   0 0 0

## 2022-02-06 NOTE — Progress Notes (Signed)
Subjective:    Patient ID: Chael Urenda, male    DOB: 1950-09-20, 72 y.o.   MRN: [id number]  Shoulder Pain    72 year old male who  has a past medical history of Arthritis, Brain aneurysm, Chicken pox, GERD (gastroesophageal reflux disease), Melanoma ([place]), and Stroke ([place]).  He presents to the office today for chronic left shoulder pain. He last had a steroid injection in the left shoulder about 8 months ago and responded well to this for the most part but did continue to have some pain. He has been exercising and weight training to see if this helps but has not noticed any relief. Has some pain with ROM such as raising arm about his head. No decreased strength  Mild acromioclavicular and glenohumeral osteoarthritis.    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 Currently resided in a private residence with his wife. Denies pets.    Fun: Golf and likes to swim / yard-work   Denies religious beliefs effecting healthcare.   One Child    Two grandchildren       Social Determinants of Health   Financial Resource Strain: Low Risk  (01/04/2022)   Overall Financial Resource Strain (CARDIA)    Difficulty of Paying Living Expenses: Not hard at all  Food Insecurity: No Food  Insecurity (01/04/2022)   Hunger Vital Sign    Worried About Running Out of Food in the Last Year: Never true    Ran Out of Food in the Last Year: Never true  Transportation Needs: No Transportation Needs (01/04/2022)   PRAPARE - Hydrologist (Medical): No    Lack of Transportation (Non-Medical): No  Physical Activity: Sufficiently Active (01/04/2022)   Exercise Vital Sign    Days of Exercise per Week: 4 days    Minutes of Exercise per Session: 60 min  Stress: No Stress Concern Present (01/04/2022)   [place]    Feeling of Stress : Not at all  Social Connections: [place] (01/04/2022)   Social Connection and Isolation Panel [NHANES]    Frequency of Communication with Friends and Family: More than three times a week    Frequency of Social Gatherings with Friends and Family: More than three times a week    Attends Religious Services: More than 4 times per year    Active Member of [employer] or Organizations: Yes    Attends Music therapist: More than 4 times per year    Marital Status: Married  Human resources officer Violence: Not  At Risk (01/04/2022)   Humiliation, Afraid, Rape, and Kick questionnaire    Fear of Current or Ex-Partner: No    Emotionally Abused: No    Physically Abused: No    Sexually Abused: No    Past Surgical History:  Procedure Laterality Date   BRAIN SURGERY  1991   TONSILLECTOMY AND ADENOIDECTOMY      Family History  Problem Relation Age of Onset   Aneurysm Mother        brain    Arthritis Mother    Parkinson's disease Mother    Stroke Father    Arthritis Father    Syncope episode Father    Arthritis Maternal Grandmother    Diabetes Maternal Grandfather    Arthritis Paternal Grandmother    Tremor Brother     No Active Allergies  Current Outpatient Medications on File Prior to Visit  Medication Sig Dispense Refill   Black Pepper-Turmeric  3-500 MG CAPS      diclofenac Sodium (VOLTAREN) 1 % GEL Apply 2 g topically 4 (four) times daily as needed. Apply topically 4 (four) times daily as needed. 150 g 6   Multiple Vitamins-Iron (MULTIVITAMINS WITH IRON) TABS tablet Take 1 tablet by mouth daily.     Omega-3 Fatty Acids (FISH OIL) 1200 MG CPDR      rosuvastatin (CRESTOR) 10 MG tablet TAKE 1 TABLET BY MOUTH DAILY 30 tablet 2   TURMERIC PO Take by mouth.     VIMPAT 100 MG TABS TAKE ONE TABLET BY MOUTH EVERY MORNING AND TAKE ONE TABLET BY MOUTH AT BEDTIME 180 tablet 1   No current facility-administered medications on file prior to visit.    BP 120/70   Pulse (!) 59   Temp 98.3 F (36.8 C) (Oral)   Ht '5\' 11"'$  (1.803 m)   Wt 198 lb (89.8 kg)   SpO2 99%   BMI 27.62 kg/m       Objective:   Physical Exam Vitals and nursing note reviewed.  Constitutional:      Appearance: Normal appearance.  Musculoskeletal:     Left shoulder: Bony tenderness and crepitus present. No swelling, deformity or tenderness. Normal range of motion. Normal strength.  Skin:    General: Skin is warm and dry.     Capillary Refill: Capillary refill takes less than 2 seconds.  Neurological:     General: No focal deficit present.     Mental Status: He is alert and oriented to person, place, and time.       Assessment & Plan:  1. Chronic left shoulder pain  Discussed treatment options including sending to sports medicine for [place] of left shoulder to completely r/o rotator cuff injury. He would like to try another steroid injection and see how he does. Has his CPE in one month  Shoulder injection Verbal consent obtained and verified. Sterile betadine prep. Furthur cleansed with alcohol. Topical analgesic spray: Ethyl chloride. Joint: left subacromial injection Approached in typical fashion with: posterior approach Completed without difficulty Meds: 3 cc lidocaine 2% no epi, 1 cc depomedrol '80mg'$ /cc Needle:1.5 inch 25 gauge Aftercare instructions and  Red flags advised. Immediate improvement in pain noted  - methylPREDNISolone acetate (DEPO-MEDROL) injection 80 mg  Dorothyann Peng, NP

## 2022-03-07 ENCOUNTER — Other Ambulatory Visit: Payer: Self-pay | Admitting: Adult Health

## 2022-03-07 ENCOUNTER — Encounter: Payer: Self-pay | Admitting: Adult Health

## 2022-03-07 ENCOUNTER — Ambulatory Visit (INDEPENDENT_AMBULATORY_CARE_PROVIDER_SITE_OTHER): Payer: Medicare Other | Admitting: Adult Health

## 2022-03-07 VITALS — BP 120/82 | HR 53 | Temp 97.8°F | Ht 71.0 in | Wt 195.0 lb

## 2022-03-07 DIAGNOSIS — R351 Nocturia: Secondary | ICD-10-CM | POA: Diagnosis not present

## 2022-03-07 DIAGNOSIS — G40211 Localization-related (focal) (partial) symptomatic epilepsy and epileptic syndromes with complex partial seizures, intractable, with status epilepticus: Secondary | ICD-10-CM | POA: Diagnosis not present

## 2022-03-07 DIAGNOSIS — N401 Enlarged prostate with lower urinary tract symptoms: Secondary | ICD-10-CM | POA: Diagnosis not present

## 2022-03-07 DIAGNOSIS — E782 Mixed hyperlipidemia: Secondary | ICD-10-CM | POA: Diagnosis not present

## 2022-03-07 DIAGNOSIS — Z85828 Personal history of other malignant neoplasm of skin: Secondary | ICD-10-CM

## 2022-03-07 DIAGNOSIS — G40209 Localization-related (focal) (partial) symptomatic epilepsy and epileptic syndromes with complex partial seizures, not intractable, without status epilepticus: Secondary | ICD-10-CM

## 2022-03-07 LAB — COMPREHENSIVE METABOLIC PANEL
ALT: 19 U/L (ref 0–53)
AST: 20 U/L (ref 0–37)
Albumin: 4.5 g/dL (ref 3.5–5.2)
Alkaline Phosphatase: 53 U/L (ref 39–117)
BUN: 27 mg/dL — ABNORMAL HIGH (ref 6–23)
CO2: 30 mEq/L (ref 19–32)
Calcium: 9.3 mg/dL (ref 8.4–10.5)
Chloride: 102 mEq/L (ref 96–112)
Creatinine, Ser: 1 mg/dL (ref 0.40–1.50)
GFR: 75.49 mL/min (ref >60.00)
Glucose, Bld: 91 mg/dL (ref 70–99)
Potassium: 4.3 mEq/L (ref 3.5–5.1)
Sodium: 139 mEq/L (ref 135–145)
Total Bilirubin: 0.6 mg/dL (ref 0.2–1.2)
Total Protein: 7.2 g/dL (ref 6.0–8.3)

## 2022-03-07 LAB — CBC
HCT: 44.4 % (ref 39.0–52.0)
Hemoglobin: 15 g/dL (ref 13.0–17.0)
MCHC: 33.8 g/dL (ref 30.0–36.0)
MCV: 94.7 fl (ref 78.0–100.0)
Platelets: 227 10*3/uL (ref 150.0–400.0)
RBC: 4.69 Mil/uL (ref 4.22–5.81)
RDW: 13.9 % (ref 11.5–15.5)
WBC: 5.6 10*3/uL (ref 4.0–10.5)

## 2022-03-07 LAB — TSH: TSH: 3.09 u[IU]/mL (ref 0.35–5.50)

## 2022-03-07 LAB — LIPID PANEL
Cholesterol: 132 mg/dL (ref 0–200)
HDL: 52.8 mg/dL (ref >39.00)
LDL Cholesterol: 70 mg/dL (ref 0–99)
NonHDL: 79.03
Total CHOL/HDL Ratio: 2
Triglycerides: 45 mg/dL (ref 0.0–149.0)
VLDL: 9 mg/dL (ref 0.0–40.0)

## 2022-03-07 LAB — PSA: PSA: 0.24 ng/mL (ref 0.10–4.00)

## 2022-03-07 NOTE — Patient Instructions (Signed)
It was great seeing you today   We will follow up with you regarding your lab work   Please let me know if you need anything   

## 2022-03-07 NOTE — Progress Notes (Signed)
Subjective:    Patient ID: James Myers, male    DOB: [birth year], 72 y.o.   MRN: [id number]  HPI Patient presents for yearly preventative medicine examination. He is a pleasant 72 year old male who  has a past medical history of Arthritis, Brain aneurysm, Chicken pox, GERD (gastroesophageal reflux disease), Melanoma ([place]), and Stroke ([place]).  Epilepsy -partial symptomatic epilepsy with complex partial seizures.  He is followed by neurology.  He is compliant with Vimpat 100 mg twice daily and tolerates this medication without side effects.  Has not experienced any recent seizure-like activity.  Hyperlipidemia - takes Crestor 10 mg daily. He denies myalgia or fatigue  Lab Results  Component Value Date   CHOL 136 11/24/2020   HDL 46.90 11/24/2020   LDLCALC 79 11/24/2020   TRIG 53.0 11/24/2020   CHOLHDL 3 11/24/2020   H/o skin cancer -has routine visits with dermatology.  BPH - has mild symptoms with nocturia - goes two nights a night.   All immunizations and health maintenance protocols were reviewed with the patient and needed orders were placed.  Appropriate screening laboratory values were ordered for the patient including screening of hyperlipidemia, renal function and hepatic function. If indicated by BPH, a PSA was ordered.  Medication reconciliation,  past medical history, social history, problem list and allergies were reviewed in detail with the patient  Goals were established with regard to weight loss, exercise, and  diet in compliance with medications  He is up to date on routine colon cancer screening    Review of Systems  Constitutional: Negative.   HENT: Negative.    Eyes: Negative.   Respiratory: Negative.    Cardiovascular: Negative.   Gastrointestinal: Negative.   Endocrine: Negative.   Genitourinary: Negative.   Musculoskeletal: Negative.   Skin: Negative.   Allergic/Immunologic: Negative.   Neurological: Negative.   Hematological: Negative.    Psychiatric/Behavioral: Negative.    All other systems reviewed and are negative.  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 Currently resided in a private residence with his wife. Denies pets.    Fun: Golf and likes to swim / yard-work   Denies religious beliefs effecting healthcare.   One Child    Two grandchildren       Social Determinants of Health   Financial Resource Strain: Low Risk  (01/04/2022)   Overall Financial Resource Strain (CARDIA)    Difficulty of Paying Living Expenses: Not hard at all  Food Insecurity: No Food Insecurity (01/04/2022)   Hunger Vital Sign    Worried About Running Out of Food in the Last Year: Never true  Ran Out of Food in the Last Year: Never true  Transportation Needs: No Transportation Needs (01/04/2022)   PRAPARE - Hydrologist (Medical): No    Lack of Transportation (Non-Medical): No  Physical Activity: Sufficiently Active (01/04/2022)   Exercise Vital Sign    Days of Exercise per Week: 4 days    Minutes of Exercise per Session: 60 min  Stress: No Stress Concern Present (01/04/2022)   [place]    Feeling of Stress : Not at all  Social Connections: [place] (01/04/2022)   Social Connection and Isolation Panel [NHANES]    Frequency of Communication with Friends and Family: More than three times a week    Frequency of Social Gatherings with Friends and Family: More than three times a week    Attends Religious Services: More than 4 times per year    Active Member of [employer] or Organizations: Yes    Attends Music therapist: More than 4 times per year    Marital Status: Married  Human resources officer Violence: Not At Risk (01/04/2022)   Humiliation, Afraid, Rape, and Kick questionnaire    Fear of Current or Ex-Partner: No    Emotionally Abused: No    Physically Abused: No    Sexually Abused: No    Past Surgical History:  Procedure Laterality Date   BRAIN SURGERY  1991   TONSILLECTOMY AND ADENOIDECTOMY      Family History  Problem Relation Age of Onset   Aneurysm Mother        brain    Arthritis Mother    Parkinson's disease Mother    Stroke Father    Arthritis Father    Syncope episode Father    Arthritis Maternal Grandmother    Diabetes Maternal Grandfather    Arthritis Paternal Grandmother    Tremor Brother     No Active Allergies  Current Outpatient Medications on File Prior to Visit  Medication Sig Dispense Refill   Black Pepper-Turmeric 3-500 MG CAPS      diclofenac Sodium (VOLTAREN) 1 % GEL Apply 2 g topically 4 (four) times daily as needed. Apply topically 4 (four) times daily as needed. 150 g 6   Multiple Vitamins-Iron (MULTIVITAMINS WITH IRON) TABS tablet Take 1 tablet by mouth daily.     Omega-3 Fatty Acids (FISH OIL) 1200 MG CPDR      rosuvastatin (CRESTOR) 10 MG tablet TAKE 1 TABLET BY MOUTH DAILY 30 tablet 2   TURMERIC PO Take by mouth.     VIMPAT 100 MG TABS TAKE ONE TABLET BY MOUTH EVERY MORNING AND TAKE ONE TABLET BY MOUTH AT BEDTIME 180 tablet 1   No current facility-administered medications on file prior to visit.    There were no vitals taken for this visit.      Objective:    Physical Exam Vitals and nursing note reviewed.  Constitutional:      General: He is not in acute distress.    Appearance: Normal appearance. He is not ill-appearing.  HENT:     Head: Normocephalic and atraumatic.     Right Ear: Tympanic membrane, ear canal and external ear normal. There is no impacted cerumen.     Left Ear: Tympanic membrane, ear canal and external ear normal. There is no impacted cerumen.     Nose: Nose normal. No congestion or rhinorrhea.     Mouth/Throat:     Mouth: Mucous membranes are moist.  Pharynx: Oropharynx is clear.  Eyes:     Extraocular Movements: Extraocular movements intact.     Conjunctiva/sclera: Conjunctivae normal.     Pupils: Pupils are equal, round, and reactive to light.  Neck:     Vascular: No carotid bruit.  Cardiovascular:     Rate and Rhythm: Normal rate and regular rhythm.     Pulses: Normal pulses.     Heart sounds: No murmur heard.    No friction rub. No gallop.  Pulmonary:     Effort: Pulmonary effort is normal.     Breath sounds: Normal breath sounds.  Abdominal:     General: Abdomen is flat. Bowel sounds are normal. There is no distension.     Palpations: Abdomen is soft. There is no mass.     Tenderness: There is no abdominal tenderness. There is no guarding or rebound.     Hernia: No hernia is present.  Musculoskeletal:        General: Normal range of motion.     Cervical back: Normal range of motion and neck supple.  Lymphadenopathy:     Cervical: No cervical adenopathy.  Skin:    General: Skin is warm and dry.     Capillary Refill: Capillary refill takes less than 2 seconds.  Neurological:     General: No focal deficit present.     Mental Status: He is alert and oriented to person, place, and time.  Psychiatric:        Mood and Affect: Mood normal.        Behavior: Behavior normal.        Thought Content: Thought content normal.        Judgment: Judgment normal.       Assessment & Plan:  1. Partial  symptomatic epilepsy with complex partial seizures, intractable, with status epilepticus [facility]) - Per neurology. Continue Vimpat.   - Lipid panel; Future - TSH; Future - CBC; Future - Comprehensive metabolic panel; Future  2. Mixed hyperlipidemia - Consider increase in statin  - Lipid panel; Future - TSH; Future - CBC; Future - Comprehensive metabolic panel; Future  3. Benign prostatic hyperplasia with nocturia  - PSA; Future  4. History of skin cancer - Per dermatology    Dorothyann Peng, NP

## 2022-04-03 DIAGNOSIS — L84 Corns and callosities: Secondary | ICD-10-CM | POA: Diagnosis not present

## 2022-04-03 DIAGNOSIS — D485 Neoplasm of uncertain behavior of skin: Secondary | ICD-10-CM | POA: Diagnosis not present

## 2022-04-03 DIAGNOSIS — L570 Actinic keratosis: Secondary | ICD-10-CM | POA: Diagnosis not present

## 2022-04-03 DIAGNOSIS — L821 Other seborrheic keratosis: Secondary | ICD-10-CM | POA: Diagnosis not present

## 2022-04-03 DIAGNOSIS — Z85828 Personal history of other malignant neoplasm of skin: Secondary | ICD-10-CM | POA: Diagnosis not present

## 2022-04-03 DIAGNOSIS — D1801 Hemangioma of skin and subcutaneous tissue: Secondary | ICD-10-CM | POA: Diagnosis not present

## 2022-04-03 DIAGNOSIS — L820 Inflamed seborrheic keratosis: Secondary | ICD-10-CM | POA: Diagnosis not present

## 2022-04-03 DIAGNOSIS — L853 Xerosis cutis: Secondary | ICD-10-CM | POA: Diagnosis not present

## 2022-04-05 DIAGNOSIS — L820 Inflamed seborrheic keratosis: Secondary | ICD-10-CM | POA: Diagnosis not present

## 2022-04-05 DIAGNOSIS — L570 Actinic keratosis: Secondary | ICD-10-CM | POA: Diagnosis not present

## 2022-04-05 DIAGNOSIS — D1801 Hemangioma of skin and subcutaneous tissue: Secondary | ICD-10-CM | POA: Diagnosis not present

## 2022-04-05 DIAGNOSIS — D485 Neoplasm of uncertain behavior of skin: Secondary | ICD-10-CM | POA: Diagnosis not present

## 2022-04-05 DIAGNOSIS — L853 Xerosis cutis: Secondary | ICD-10-CM | POA: Diagnosis not present

## 2022-04-05 DIAGNOSIS — L84 Corns and callosities: Secondary | ICD-10-CM | POA: Diagnosis not present

## 2022-04-05 DIAGNOSIS — L821 Other seborrheic keratosis: Secondary | ICD-10-CM | POA: Diagnosis not present

## 2022-04-05 DIAGNOSIS — Z85828 Personal history of other malignant neoplasm of skin: Secondary | ICD-10-CM | POA: Diagnosis not present

## 2022-04-29 ENCOUNTER — Other Ambulatory Visit: Payer: Self-pay | Admitting: Cardiovascular Disease

## 2022-06-28 ENCOUNTER — Other Ambulatory Visit: Payer: Self-pay

## 2022-06-28 NOTE — Telephone Encounter (Signed)
Called  and spoke to patient  RN informed patient it has been since 2021  since last visit with cardiology.  In order to continue with the refill patient will need to schedule an appointment with [facility]. Rn informed patient or he can have primary to take over refills.  Patient states he does not want to continue to  take medication Crestor. RN informed patient he would need to speak with primary , since he does not want schedule an appointment with [facility].  Patient  expressed  appreciation to RN for calling him about refill of medications . Patient aware to discuss with primary doctor. RN  made patient aware medication is decline for refill.

## 2022-07-03 ENCOUNTER — Other Ambulatory Visit: Payer: Self-pay | Admitting: *Deleted

## 2022-08-06 NOTE — Progress Notes (Deleted)
[facility] [address] [place]. [place] [address] [phone]       OFFICE FOLLOW UP NOTE  James. James Myers Date of Birth:  12/21/1950 Medical Record Number:  [id number]   Referring MD: Nicki Guadalajara Reason for visit: Seizures   No chief complaint on file.   HPI:   Update 08/07/2022 JM: Returns for 1 year seizure follow-up.  Has been stable without any recurrent seizure activity.  Compliant on Vimpat 100 mg twice daily without side effects.  Maintains ADLs and IADLs independently.  Lab work completed 03/2022 by PCP which is satisfactory.     History provided for reference purposes only Update 07/18/2021 JM: Patient returns for 1 year seizure follow-up unaccompanied.  Doing well.  Denies seizure activity.  Compliant on Vimpat 100 mg twice daily, denies side effects.  Continues to maintain ADLs and IADLs.  He routinely follows with PCP.  No new concerns at this time.   Update 07/18/2020 JM: James. James Myers is being seen via video visit for 6 months seizure follow-up unaccompanied.  Reports he has been doing well since prior visit without any additional seizure events or symptoms.  Compliant on Vimpat 100 mg twice daily tolerating without side effects. He does refrain from ETOH use since his seizures.  He does report having some difficulties with arthritis of lumbar region but has been gradually improving and currently using OTC omega-3, turmeric and Voltaren gel with benefit.  No new concerns at this time.  Update 01/06/2020 JM: James. James Myers returns for 45-month seizure follow-up.  Remains on Vimpat 100 mg twice daily without side effects and denies any reoccurring seizure events or symptoms.  Maintains ADLs and IADLs independently as well as driving without difficulty.  No concerns at this time.  Update 07/07/2019: Returns for seizure follow-up.  He has been doing well since prior visit 6 months ago without recurrent seizure activity remains on Vimpat 100 mg twice daily tolerating  well. He does report cessation of all alcohol.  Blood pressure today 135/74.  He questions safety of arthritic pain medication such as naproxen in regards to seizures and use of Vimpat and possible interaction. He does use voltaren gel and aspercream as needed at night only. No other concerns at this time.   Initial visit 12/02/2017 ;James Myers is a pleasant 72 year old Caucasian male who has had multiple episodes of brief loss of consciousness since April 2017.  History is obtained from the patient and review of electronic medical records.  I personally reviewed imaging films.  He actually states that the few months prior to his first episode of passing out he used to have brief episodes of particular smell or taste and he would manage to control it and prevent it from getting worse.  The first episode he passed out while he was brushing teeth he had a feeling that something were to happen before he could control it he fell down got up quickly he was out for only few seconds.  Second episode occurred when he was outside all of a sudden his wife who was standing behind him noticed that he was leaning to one side and started snorting when his wife began to wake him up he started jerking his arms and then woke up and was quite disoriented for a few minutes.  The third episode he fell in the kitchen floor on the hardwood floor and woke up in a minute.  He is felt he did not have a warning.  The wife also reports  other episodes like a staring episode she weakness in the gym about 4 weeks ago when the patient was staring and was briefly unresponsive and not talking within 30 seconds he started speaking.  Patient has no prior history of known seizures significant head injury with loss of consciousness or intracranial issues except that he had a history of ruptured subarachnoid hemorrhage in 1991 for which he underwent craniotomy and surgical clipping in [place].  He apparently did quite well from that and had no  significant physical deficits or cognitive issues.  He has been seen by cardiology who have ordered an echocardiogram which was unremarkable.  Carotid ultrasound done on 11/06/2017 showed no significant extracranial stenosis.  CT scan of the head on 10/28/2017 showed old left craniotomy defect and aneurysm clip on the left side anteriorly.  No acute abnormality.  Patient has not had an EEG.  He has not followed up with a neurologist.   It is unclear if he can have an MRI because his aneurysm clip was in 1991 and I am not sure it is MRI compatible  Update 02/11/2018 ; he returns for follow-up after last visit 2 months ago.  He states he has had no further episodes of aura or passing out or seizure-like episodes.  He is still on Keppra XR 500 mg daily and states that he felt tired and hence did not feel like he needed to increase the dose further.  He complains of occasional positional dizziness when he bends down.  He is otherwise doing well without any new symptoms.  He had EEG done on 12/31/2017 which I personally reviewed and was normal.  He had CT angiogram of the brain done on 12/11/2017 which also have reviewed shows no new aneurysms.  The previous surgical the clipped aneurysm is noted in the left cavernous distal carotid.  Patient has no new complaints today.  Update 08/19/2018: James Myers is a 72 year old male who is being seen today for follow-up regarding seizures.  He reports approximately 6 times daily experiences sensation of nausea, increased elevation, balance difficulties and fatigue which lasts for approximately 1 minute.  Denies loss of consciousness, dizziness, SOB or palpitations.  Unable to associate activity with the symptoms as he can be sitting still while he experiences these.  Prior seizure episodes consist of auras and passing out but he feels as though his most recent symptoms are different from his possible seizure-like episodes.  These have been present for approximately 1 month.  He does  endorse increasing Keppra XR dose as recommended at prior visit from 500 mg daily to 500 mg twice daily approximately 1 month ago.  He denies any recurrent seizure-like episodes.  Blood pressure stable 127/72.  Heart rate 52.  He does not check vitals during or after these episodes.  Update 12/31/2018: James. James Myers is a 72 year old male who is being seen today for seizure follow-up.  Due to possible continued seizure activity, Keppra dosage increased after prior visit.  Initially doing well with increasing dose but then started experiencing more frequent episodes as described above therefore recommend transitioning to Lamictal.  He unfortunately experienced frequent episodes of diarrhea therefore transitioned to Vimpat.  He is currently on 100 mg twice daily which he has been tolerating well without any side effects or reoccurring seizure or seizure type activity.  He does report yesterday having a 30 second episode of vertigo but has not experienced vertigo symptoms since and denies any recent vertigo prior to yesterday.  He did check  blood pressure at that time and was elevated for him at 133/25.  Blood pressure at today's visit 118/64.  He does report recent weight loss with daily exercise and eating healthy.  No further concerns at this time.       ROS:   14 system review of systems is positive for those listed in HPI and all other systems negative  PMH:  Past Medical History:  Diagnosis Date   Arthritis    Brain aneurysm    Chicken pox    GERD (gastroesophageal reflux disease)    Melanoma (HCC)    Stroke [facility])     Social History:  Social History   Socioeconomic History   Marital status: Married    Spouse name: Not on file   Number of children: 1   Years of education: 16   Highest education level: Bachelor's degree (e.g., BA, AB, BS)  Occupational History   Occupation: Retired     Comment: Firefighter for [employer]  Tobacco Use   Smoking status: Never   Smokeless tobacco: Never   Substance and Sexual Activity   Alcohol use: Yes    Comment: 4 oz daily   Drug use: No   Sexual activity: Yes  Other Topics Concern   Not on file  Social History Narrative   Born and raised in [place]. Currently resided in a private residence with his wife. Denies pets.    Fun: Golf and likes to swim / yard-work   Denies religious beliefs effecting healthcare.   One Child    Two grandchildren       Social Determinants of Health   Financial Resource Strain: Low Risk  (01/04/2022)   Overall Financial Resource Strain (CARDIA)    Difficulty of Paying Living Expenses: Not hard at all  Food Insecurity: No Food Insecurity (01/04/2022)   Hunger Vital Sign    Worried About Running Out of Food in the Last Year: Never true    Ran Out of Food in the Last Year: Never true  Transportation Needs: No Transportation Needs (01/04/2022)   PRAPARE - Administrator, Civil Service (Medical): No    Lack of Transportation (Non-Medical): No  Physical Activity: Sufficiently Active (01/04/2022)   Exercise Vital Sign    Days of Exercise per Week: 4 days    Minutes of Exercise per Session: 60 min  Stress: No Stress Concern Present (01/04/2022)   [employer] of Occupational Health - Occupational Stress Questionnaire    Feeling of Stress : Not at all  Social Connections: Socially Integrated (01/04/2022)   Social Connection and Isolation Panel [NHANES]    Frequency of Communication with Friends and Family: More than three times a week    Frequency of Social Gatherings with Friends and Family: More than three times a week    Attends Religious Services: More than 4 times per year    Active Member of [employer] or Organizations: Yes    Attends Engineer, structural: More than 4 times per year    Marital Status: Married  Catering manager Violence: Not At Risk (01/04/2022)   Humiliation, Afraid, Rape, and Kick questionnaire    Fear of Current or Ex-Partner: No    Emotionally Abused: No     Physically Abused: No    Sexually Abused: No    Medications:   Current Outpatient Medications on File Prior to Visit  Medication Sig Dispense Refill   Black Pepper-Turmeric 3-500 MG CAPS      diclofenac Sodium (VOLTAREN)  1 % GEL Apply 2 g topically 4 (four) times daily as needed. Apply topically 4 (four) times daily as needed. 150 g 6   Multiple Vitamins-Iron (MULTIVITAMINS WITH IRON) TABS tablet Take 1 tablet by mouth daily.     Omega-3 Fatty Acids (FISH OIL) 1200 MG CPDR      rosuvastatin (CRESTOR) 10 MG tablet Take 1 tablet (10 mg total) by mouth daily. 30 tablet 1   Tart Cherry 1200 MG CAPS      TURMERIC PO Take by mouth.     VIMPAT 100 MG TABS TAKE ONE TABLET BY MOUTH EVERY MORNING AND 1 AT BEDTIME 60 tablet 5   No current facility-administered medications on file prior to visit.    Allergies:  No Active Allergies  Physical Exam There were no vitals filed for this visit.  There is no height or weight on file to calculate BMI.  General: well developed, well nourished, very pleasant elderly Caucasian male, seated, in no evident distress Head: head normocephalic and atraumatic.   Neck: supple with no carotid or supraclavicular bruits Cardiovascular: regular rate and rhythm, no murmurs Musculoskeletal: no deformity Skin:  no rash/petichiae Vascular:  Normal pulses all extremities   Neurologic Exam Mental Status: Awake and fully alert. Oriented to place and time. Recent and remote memory intact. Attention span, concentration and fund of knowledge appropriate. Mood and affect appropriate.  Cranial Nerves: Pupils equal, briskly reactive to light. Extraocular movements full without nystagmus. Visual fields full to confrontation. Hearing intact. Facial sensation intact. Face, tongue, palate moves normally and symmetrically.  Motor: Normal bulk and tone. Normal strength in all tested extremity muscles Sensory.: intact to touch , pinprick , position and vibratory sensation.   Coordination: Rapid alternating movements normal in all extremities. Finger-to-nose and heel-to-shin performed accurately bilaterally. Gait and Station: Arises from chair without difficulty. Stance is normal. Gait demonstrates normal stride length and balance without use of assistive device. Tandem walk and heel toe without difficulty.  Reflexes: 1+ and symmetric. Toes downgoing.           ASSESSMENT: 72 year male with seizure-like episodes since 2019 consisting of brief loss of consciousness as well as brief episodes of aura possibly complex partial seizures with and without secondary generalization.  Remote history of subarachnoid hemorrhage status post surgical aneurysm clipping in 1991and seizures may be symptomatic late effect  from this.  Initially placed on Keppra with complaints of worsening symptoms and unable to tolerate Lamictal therefore currently on Vimpat tolerating well without recurrent seizure activity.      PLAN: -Continuation of Vimpat 100 mg twice daily for seizure prevention. Refill up-to-date until 10/2021 -Discussed avoidance of seizure triggers and to call office with any seizure events or symptoms     Follow-up in 1 year or call earlier if needed   CC:  Nafziger, Kandee Keen, NP    I spent 20 minutes of face-to-face and non-face-to-face time with patient.  This included previsit chart review, order entry, electronic health record documentation, and patient education and discussion regarding history of seizures, ongoing use of Vimpat, seizure triggers and importance of avoidance and answered all other questions to patient satisfaction  Ihor Austin, [facility]  [facility] [address] [place], [place] [address]  Phone [phone] Fax [phone] Note: This document was prepared with digital dictation and possible smart phrase technology. Any transcriptional errors that result from this process are unintentional.

## 2022-08-07 ENCOUNTER — Ambulatory Visit (INDEPENDENT_AMBULATORY_CARE_PROVIDER_SITE_OTHER): Payer: Medicare Other | Admitting: Adult Health

## 2022-08-07 ENCOUNTER — Encounter: Payer: Self-pay | Admitting: Adult Health

## 2022-08-07 ENCOUNTER — Ambulatory Visit: Payer: Medicare Other | Admitting: Adult Health

## 2022-08-07 VITALS — BP 132/72 | HR 63 | Ht 71.0 in | Wt 193.0 lb

## 2022-08-07 DIAGNOSIS — G40209 Localization-related (focal) (partial) symptomatic epilepsy and epileptic syndromes with complex partial seizures, not intractable, without status epilepticus: Secondary | ICD-10-CM | POA: Diagnosis not present

## 2022-08-07 NOTE — Patient Instructions (Addendum)
Your Plan:  Continue Vimpat 100mg  twice daily for seizure prevention  Please call with any seizure activity     Follow up in 1 year or call earlier if needed     Thank you for coming to see [place] at [facility]. I hope we have been able to provide you high quality care today.  You may receive a patient satisfaction survey over the next few weeks. We would appreciate your feedback and comments so that we may continue to improve ourselves and the health of our patients.

## 2022-08-07 NOTE — Progress Notes (Signed)
[facility] [address] [place]. [place] [address] [phone]       OFFICE FOLLOW UP NOTE  James. James Myers Date of Birth:  07/15/1950 Medical Record Number:  [id number]   Referring MD: Nicki Guadalajara Reason for visit: Seizures   Chief Complaint  Patient presents with   Follow-up    Rm 3, here alone   Pt is here for 1 year seizure follow-up. States no seizure activity in the last 3 years. States he is doing great.     HPI:   Update 08/07/2022 JM: Patient returns for yearly seizure follow-up unaccompanied.  Doing well since prior visit.  Denies any seizure activity.  Remains on Vimpat 100 mg twice daily, tolerating well.  Continues to drive and maintains ADLs and IADLs independently.  Routinely follows with PCP with routine lab work which has been satisfactory.     History provided for reference purposes only Update 07/18/2021 JM: Patient returns for 1 year seizure follow-up unaccompanied.  Doing well.  Denies seizure activity.  Compliant on Vimpat 100 mg twice daily, denies side effects.  Continues to maintain ADLs and IADLs.  He routinely follows with PCP.  No new concerns at this time.   Update 07/18/2020 JM: James Myers is being seen via video visit for 6 months seizure follow-up unaccompanied.  Reports he has been doing well since prior visit without any additional seizure events or symptoms.  Compliant on Vimpat 100 mg twice daily tolerating without side effects. He does refrain from ETOH use since his seizures.  He does report having some difficulties with arthritis of lumbar region but has been gradually improving and currently using OTC omega-3, turmeric and Voltaren gel with benefit.  No new concerns at this time.  Update 01/06/2020 JM: James Myers returns for 41-month seizure follow-up.  Remains on Vimpat 100 mg twice daily without side effects and denies any reoccurring seizure events or symptoms.  Maintains ADLs and IADLs independently as well as driving  without difficulty.  No concerns at this time.  Update 07/07/2019: Returns for seizure follow-up.  He has been doing well since prior visit 6 months ago without recurrent seizure activity remains on Vimpat 100 mg twice daily tolerating well. He does report cessation of all alcohol.  Blood pressure today 135/74.  He questions safety of arthritic pain medication such as naproxen in regards to seizures and use of Vimpat and possible interaction. He does use voltaren gel and aspercream as needed at night only. No other concerns at this time.   Initial visit 12/02/2017 ;James Myers is a pleasant 72 year old Caucasian male who has had multiple episodes of brief loss of consciousness since April 2017.  History is obtained from the patient and review of electronic medical records.  I personally reviewed imaging films.  He actually states that the few months prior to his first episode of passing out he used to have brief episodes of particular smell or taste and he would manage to control it and prevent it from getting worse.  The first episode he passed out while he was brushing teeth he had a feeling that something were to happen before he could control it he fell down got up quickly he was out for only few seconds.  Second episode occurred when he was outside all of a sudden his wife who was standing behind him noticed that he was leaning to one side and started snorting when his wife began to wake him up he started jerking his arms and  then woke up and was quite disoriented for a few minutes.  The third episode he fell in the kitchen floor on the hardwood floor and woke up in a minute.  He is felt he did not have a warning.  The wife also reports other episodes like a staring episode she weakness in the gym about 4 weeks ago when the patient was staring and was briefly unresponsive and not talking within 30 seconds he started speaking.  Patient has no prior history of known seizures significant head injury with loss of  consciousness or intracranial issues except that he had a history of ruptured subarachnoid hemorrhage in 1991 for which he underwent craniotomy and surgical clipping in [place].  He apparently did quite well from that and had no significant physical deficits or cognitive issues.  He has been seen by cardiology who have ordered an echocardiogram which was unremarkable.  Carotid ultrasound done on 11/06/2017 showed no significant extracranial stenosis.  CT scan of the head on 10/28/2017 showed old left craniotomy defect and aneurysm clip on the left side anteriorly.  No acute abnormality.  Patient has not had an EEG.  He has not followed up with a neurologist.   It is unclear if he can have an MRI because his aneurysm clip was in 1991 and I am not sure it is MRI compatible  Update 02/11/2018 ; he returns for follow-up after last visit 2 months ago.  He states he has had no further episodes of aura or passing out or seizure-like episodes.  He is still on Keppra XR 500 mg daily and states that he felt tired and hence did not feel like he needed to increase the dose further.  He complains of occasional positional dizziness when he bends down.  He is otherwise doing well without any new symptoms.  He had EEG done on 12/31/2017 which I personally reviewed and was normal.  He had CT angiogram of the brain done on 12/11/2017 which also have reviewed shows no new aneurysms.  The previous surgical the clipped aneurysm is noted in the left cavernous distal carotid.  Patient has no new complaints today.  Update 08/19/2018: James Myers is a 72 year old male who is being seen today for follow-up regarding seizures.  He reports approximately 6 times daily experiences sensation of nausea, increased elevation, balance difficulties and fatigue which lasts for approximately 1 minute.  Denies loss of consciousness, dizziness, SOB or palpitations.  Unable to associate activity with the symptoms as he can be sitting still while he  experiences these.  Prior seizure episodes consist of auras and passing out but he feels as though his most recent symptoms are different from his possible seizure-like episodes.  These have been present for approximately 1 month.  He does endorse increasing Keppra XR dose as recommended at prior visit from 500 mg daily to 500 mg twice daily approximately 1 month ago.  He denies any recurrent seizure-like episodes.  Blood pressure stable 127/72.  Heart rate 52.  He does not check vitals during or after these episodes.  Update 12/31/2018: James Myers is a 72 year old male who is being seen today for seizure follow-up.  Due to possible continued seizure activity, Keppra dosage increased after prior visit.  Initially doing well with increasing dose but then started experiencing more frequent episodes as described above therefore recommend transitioning to Lamictal.  He unfortunately experienced frequent episodes of diarrhea therefore transitioned to Vimpat.  He is currently on 100 mg twice daily which he  has been tolerating well without any side effects or reoccurring seizure or seizure type activity.  He does report yesterday having a 30 second episode of vertigo but has not experienced vertigo symptoms since and denies any recent vertigo prior to yesterday.  He did check blood pressure at that time and was elevated for him at 133/25.  Blood pressure at today's visit 118/64.  He does report recent weight loss with daily exercise and eating healthy.  No further concerns at this time.       ROS:   14 system review of systems is positive for those listed in HPI and all other systems negative  PMH:  Past Medical History:  Diagnosis Date   Arthritis    Brain aneurysm    Chicken pox    GERD (gastroesophageal reflux disease)    Melanoma (HCC)    Stroke [facility])     Social History:  Social History   Socioeconomic History   Marital status: Married    Spouse name: Not on file   Number of children: 1    Years of education: 16   Highest education level: Bachelor's degree (e.g., BA, AB, BS)  Occupational History   Occupation: Retired     Comment: Firefighter for [employer]  Tobacco Use   Smoking status: Never   Smokeless tobacco: Never  Substance and Sexual Activity   Alcohol use: Yes    Comment: 4 oz daily   Drug use: No   Sexual activity: Yes  Other Topics Concern   Not on file  Social History Narrative   Born and raised in [place]. Currently resided in a private residence with his wife. Denies pets.    Fun: Golf and likes to swim / yard-work   Denies religious beliefs effecting healthcare.   One Child    Two grandchildren       Social Determinants of Health   Financial Resource Strain: Low Risk  (01/04/2022)   Overall Financial Resource Strain (CARDIA)    Difficulty of Paying Living Expenses: Not hard at all  Food Insecurity: No Food Insecurity (01/04/2022)   Hunger Vital Sign    Worried About Running Out of Food in the Last Year: Never true    Ran Out of Food in the Last Year: Never true  Transportation Needs: No Transportation Needs (01/04/2022)   PRAPARE - Administrator, Civil Service (Medical): No    Lack of Transportation (Non-Medical): No  Physical Activity: Sufficiently Active (01/04/2022)   Exercise Vital Sign    Days of Exercise per Week: 4 days    Minutes of Exercise per Session: 60 min  Stress: No Stress Concern Present (01/04/2022)   [employer] of Occupational Health - Occupational Stress Questionnaire    Feeling of Stress : Not at all  Social Connections: Socially Integrated (01/04/2022)   Social Connection and Isolation Panel [NHANES]    Frequency of Communication with Friends and Family: More than three times a week    Frequency of Social Gatherings with Friends and Family: More than three times a week    Attends Religious Services: More than 4 times per year    Active Member of [employer] or Organizations: Yes    Attends Banker  Meetings: More than 4 times per year    Marital Status: Married  Catering manager Violence: Not At Risk (01/04/2022)   Humiliation, Afraid, Rape, and Kick questionnaire    Fear of Current or Ex-Partner: No    Emotionally Abused: No  Physically Abused: No    Sexually Abused: No    Medications:   Current Outpatient Medications on File Prior to Visit  Medication Sig Dispense Refill   Black Pepper-Turmeric 3-500 MG CAPS      diclofenac Sodium (VOLTAREN) 1 % GEL Apply 2 g topically 4 (four) times daily as needed. Apply topically 4 (four) times daily as needed. 150 g 6   Multiple Vitamins-Iron (MULTIVITAMINS WITH IRON) TABS tablet Take 1 tablet by mouth daily.     Omega-3 Fatty Acids (FISH OIL) 1200 MG CPDR      rosuvastatin (CRESTOR) 10 MG tablet Take 1 tablet (10 mg total) by mouth daily. 30 tablet 1   Tart Cherry 1200 MG CAPS      TURMERIC PO Take by mouth.     VIMPAT 100 MG TABS TAKE ONE TABLET BY MOUTH EVERY MORNING AND 1 AT BEDTIME 60 tablet 5   No current facility-administered medications on file prior to visit.    Allergies:  No Active Allergies  Physical Exam Today's Vitals   08/07/22 1027  BP: 132/72  Pulse: 63  Weight: 193 lb (87.5 kg)  Height: 5\' 11"  (1.803 m)   Body mass index is 26.92 kg/m.  General: well developed, well nourished, very pleasant elderly Caucasian male, seated, in no evident distress Head: head normocephalic and atraumatic.   Neck: supple with no carotid or supraclavicular bruits Skin:  no rash/petichiae Vascular:  Normal pulses all extremities   Neurologic Exam Mental Status: Awake and fully alert. Oriented to place and time. Recent and remote memory intact. Attention span, concentration and fund of knowledge appropriate. Mood and affect appropriate.  Cranial Nerves: Pupils equal, briskly reactive to light. Extraocular movements full without nystagmus. Visual fields full to confrontation. Hearing intact. Facial sensation intact. Face, tongue,  palate moves normally and symmetrically.  Motor: Normal bulk and tone. Normal strength in all tested extremity muscles Sensory.: intact to touch , pinprick , position and vibratory sensation.  Coordination: Rapid alternating movements normal in all extremities. Finger-to-nose and heel-to-shin performed accurately bilaterally. Gait and Station: Arises from chair without difficulty. Stance is normal. Gait demonstrates normal stride length and balance without use of assistive device. Tandem walk and heel toe without difficulty.  Reflexes: 1+ and symmetric. Toes downgoing.           ASSESSMENT: 72 year male with seizure-like episodes since 2019 consisting of brief loss of consciousness as well as brief episodes of aura possibly complex partial seizures with and without secondary generalization.  Remote history of subarachnoid hemorrhage status post surgical aneurysm clipping in 1991and seizures may be symptomatic late effect  from this.  Initially placed on Keppra with complaints of worsening symptoms and unable to tolerate Lamictal therefore currently on Vimpat tolerating well without recurrent seizure activity.      PLAN: -Continuation of Vimpat 100 mg twice daily for seizure prevention. Needs to stay on Brand name. Refill up-to-date until 10/2022.  -Discussed avoidance of seizure triggers and to call office with any seizure events or symptoms     Follow-up in 1 year or call earlier if needed   CC:  Nafziger, Kandee Keen, NP    I spent 26 minutes of face-to-face and non-face-to-face time with patient.  This included previsit chart review, order entry, electronic health record documentation, and patient education and discussion regarding history of seizures, ongoing use of Vimpat, seizure triggers and importance of avoidance and answered all other questions to patient satisfaction  Ihor Austin, AGNP-BC  [facility] [address] [place], [place]  [address]  Phone [phone] Fax [phone] Note: This document was prepared with digital dictation and possible smart phrase technology. Any transcriptional errors that result from this process are unintentional.

## 2022-08-08 DIAGNOSIS — L570 Actinic keratosis: Secondary | ICD-10-CM | POA: Diagnosis not present

## 2022-08-08 DIAGNOSIS — Z85820 Personal history of malignant melanoma of skin: Secondary | ICD-10-CM | POA: Diagnosis not present

## 2022-08-08 DIAGNOSIS — Z85828 Personal history of other malignant neoplasm of skin: Secondary | ICD-10-CM | POA: Diagnosis not present

## 2022-08-08 DIAGNOSIS — L853 Xerosis cutis: Secondary | ICD-10-CM | POA: Diagnosis not present

## 2022-08-08 DIAGNOSIS — L821 Other seborrheic keratosis: Secondary | ICD-10-CM | POA: Diagnosis not present

## 2022-08-13 DIAGNOSIS — Z23 Encounter for immunization: Secondary | ICD-10-CM | POA: Diagnosis not present

## 2022-09-22 ENCOUNTER — Other Ambulatory Visit: Payer: Self-pay | Admitting: Adult Health

## 2022-09-22 DIAGNOSIS — G40209 Localization-related (focal) (partial) symptomatic epilepsy and epileptic syndromes with complex partial seizures, not intractable, without status epilepticus: Secondary | ICD-10-CM

## 2022-09-24 ENCOUNTER — Other Ambulatory Visit: Payer: Self-pay

## 2022-09-24 DIAGNOSIS — G40209 Localization-related (focal) (partial) symptomatic epilepsy and epileptic syndromes with complex partial seizures, not intractable, without status epilepticus: Secondary | ICD-10-CM

## 2022-09-24 MED ORDER — VIMPAT 100 MG PO TABS
100.0000 mg | ORAL_TABLET | Freq: Two times a day (BID) | ORAL | 5 refills | Status: DC
Start: 2022-09-24 — End: 2023-03-28

## 2022-10-31 ENCOUNTER — Ambulatory Visit (INDEPENDENT_AMBULATORY_CARE_PROVIDER_SITE_OTHER): Payer: Medicare Other | Admitting: Adult Health

## 2022-10-31 ENCOUNTER — Encounter: Payer: Self-pay | Admitting: Adult Health

## 2022-10-31 VITALS — BP 110/60 | HR 59 | Temp 98.0°F | Ht 71.0 in | Wt 196.0 lb

## 2022-10-31 DIAGNOSIS — R351 Nocturia: Secondary | ICD-10-CM | POA: Diagnosis not present

## 2022-10-31 DIAGNOSIS — Z125 Encounter for screening for malignant neoplasm of prostate: Secondary | ICD-10-CM | POA: Diagnosis not present

## 2022-10-31 DIAGNOSIS — Z23 Encounter for immunization: Secondary | ICD-10-CM | POA: Diagnosis not present

## 2022-10-31 DIAGNOSIS — E782 Mixed hyperlipidemia: Secondary | ICD-10-CM | POA: Diagnosis not present

## 2022-10-31 LAB — URINALYSIS
Bilirubin Urine: NEGATIVE
Hgb urine dipstick: NEGATIVE
Ketones, ur: NEGATIVE
Leukocytes,Ua: NEGATIVE
Nitrite: NEGATIVE
Specific Gravity, Urine: 1.02 (ref 1.000–1.030)
Total Protein, Urine: NEGATIVE
Urine Glucose: NEGATIVE
Urobilinogen, UA: 0.2 (ref 0.0–1.0)
pH: 6 (ref 5.0–8.0)

## 2022-10-31 LAB — PSA: PSA: 0.28 ng/mL (ref 0.10–4.00)

## 2022-10-31 MED ORDER — ROSUVASTATIN CALCIUM 10 MG PO TABS
10.0000 mg | ORAL_TABLET | Freq: Every day | ORAL | 1 refills | Status: DC
Start: 2022-10-31 — End: 2023-04-25

## 2022-10-31 NOTE — Progress Notes (Addendum)
Subjective:    Patient ID: James Myers, male    DOB: [birth year], 72 y.o.   MRN: [id number]  HPI  72 year old male who  has a past medical history of Arthritis, Brain aneurysm, Chicken pox, GERD (gastroesophageal reflux disease), Melanoma (HCC), and Stroke (HCC).  He was previously being seen by Cardiology for hyperlipidemia. He is well controlled on crestor 10 mg. He would like me to take over this medication  Lab Results  Component Value Date   CHOL 132 03/07/2022   HDL 52.80 03/07/2022   LDLCALC 70 03/07/2022   TRIG 45.0 03/07/2022   CHOLHDL 2 03/07/2022   He also reports history of BPH He has refused medication in the past. He continues to have symptoms, getting up 2 time a night and has decreased stream. He denies UTI like symptoms, fevers, chills, or pain with sitting.    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 Currently resided in a private residence with his wife. Denies pets.    Fun: Golf and likes to swim / yard-work   Denies religious beliefs effecting healthcare.   One Child    Two grandchildren       Social Determinants of Health   Financial Resource Strain: Low Risk  (10/30/2022)   Overall Financial Resource Strain (CARDIA)    Difficulty of Paying  Living Expenses: Not hard at all  Food Insecurity: No Food Insecurity (10/30/2022)   Hunger Vital Sign    Worried About Running Out of Food in the Last Year: Never true    Ran Out of Food in the Last Year: Never true  Transportation Needs: No Transportation Needs (10/30/2022)   PRAPARE - Administrator, Civil Service (Medical): No    Lack of Transportation (Non-Medical): No  Physical Activity: Sufficiently Active (10/30/2022)   Exercise Vital Sign    Days of Exercise per Week: 4 days    Minutes of Exercise per Session: 50 min  Stress: No Stress Concern Present (10/30/2022)   [employer] of Occupational Health - Occupational Stress Questionnaire    Feeling of Stress : Not at all  Social Connections: Moderately Integrated (10/30/2022)   Social Connection and Isolation Panel [NHANES]    Frequency of Communication with Friends and Family: Twice a week    Frequency of Social Gatherings with Friends and Family: Twice a week    Attends Religious Services: Never    Database administrator or Organizations: Yes    Attends Engineer, structural: More than 4 times per year    Marital Status: Married  Intimate Partner Violence: Not At Risk (01/04/2022)   Humiliation, Afraid, Rape, and Kick questionnaire    Fear of Current or Ex-Partner: No    Emotionally Abused: No    Physically Abused: No    Sexually Abused: No    Past Surgical History:  Procedure Laterality Date   BRAIN SURGERY  1991   TONSILLECTOMY AND ADENOIDECTOMY      Family History  Problem Relation Age of Onset   Aneurysm Mother        brain    Arthritis Mother    Parkinson's disease Mother    Stroke Father    Arthritis Father    Syncope episode Father    Arthritis Maternal Grandmother    Diabetes Maternal Grandfather    Arthritis Paternal Grandmother    Tremor Brother     No Active Allergies  Current Outpatient Medications on File Prior to Visit  Medication Sig Dispense Refill   Black  Pepper-Turmeric 3-500 MG CAPS      diclofenac Sodium (VOLTAREN) 1 % GEL Apply 2 g topically 4 (four) times daily as needed. Apply topically 4 (four) times daily as needed. 150 g 6   Multiple Vitamins-Iron (MULTIVITAMINS WITH IRON) TABS tablet Take 1 tablet by mouth daily.     Omega-3 Fatty Acids (FISH OIL) 1200 MG CPDR      rosuvastatin (CRESTOR) 10 MG tablet Take 1 tablet (10 mg total) by mouth daily. 30 tablet 1   Tart Cherry 1200 MG CAPS      triamcinolone cream (KENALOG) 0.1 % SMARTSIG:1 Sparingly Topical Twice Daily     TURMERIC PO Take by mouth.     VIMPAT 100 MG TABS Take 1 tablet (100 mg total) by mouth in the morning and at bedtime. 60 tablet 5   No current facility-administered medications on file prior to visit.    BP 110/60   Pulse (!) 59   Temp 98 F (36.7 C) (Oral)   Ht 5\' 11"  (1.803 m)   Wt 196 lb (88.9 kg)   SpO2 98%   BMI 27.34 kg/m       Objective:   Physical Exam Vitals and nursing note reviewed.  Constitutional:      Appearance: Normal appearance.  Cardiovascular:     Rate and Rhythm: Normal rate and regular rhythm.     Pulses: Normal pulses.     Heart sounds: Normal heart sounds.  Pulmonary:     Effort: Pulmonary effort is normal.     Breath sounds: Normal breath sounds.  Musculoskeletal:        General: Normal range of motion.  Skin:    General: Skin is warm and dry.  Neurological:     General: No focal deficit present.     Mental Status: He is alert and oriented to person, place, and time.  Psychiatric:        Mood and Affect: Mood normal.        Behavior: Behavior normal.        Thought Content: Thought content normal.        Judgment: Judgment normal.       Assessment & Plan:  1. Nocturia - He would like to check his PSA today  - Can consider Flomax - PSA; Future - Urinalysis; Future  2. Need for influenza vaccination  - Flu Vaccine Trivalent High Dose (Fluad)  3. Mixed hyperlipidemia - I am ok with taking over his crestor. Will  send in refill today  - rosuvastatin (CRESTOR) 10  MG tablet; Take 1 tablet (10 mg total) by mouth daily.  Dispense: 90 tablet; Refill: 1  Shirline Frees, NP   Time spent with patient today was 31 minutes which consisted of chart review, discussing BPH and hyperlipidemia, work up, treatment answering questions and documentation.

## 2022-10-31 NOTE — Addendum Note (Signed)
Addended by: Nancy Fetter on: 10/31/2022 09:58 AM   Modules accepted: Level of Service

## 2022-11-13 ENCOUNTER — Encounter: Payer: Self-pay | Admitting: Adult Health

## 2022-11-13 NOTE — Telephone Encounter (Signed)
FYI

## 2022-11-14 DIAGNOSIS — H2513 Age-related nuclear cataract, bilateral: Secondary | ICD-10-CM | POA: Diagnosis not present

## 2022-11-14 DIAGNOSIS — H43393 Other vitreous opacities, bilateral: Secondary | ICD-10-CM | POA: Diagnosis not present

## 2022-11-14 DIAGNOSIS — D3131 Benign neoplasm of right choroid: Secondary | ICD-10-CM | POA: Diagnosis not present

## 2022-11-14 DIAGNOSIS — H04123 Dry eye syndrome of bilateral lacrimal glands: Secondary | ICD-10-CM | POA: Diagnosis not present

## 2022-11-26 ENCOUNTER — Encounter: Payer: Self-pay | Admitting: Adult Health

## 2022-11-30 ENCOUNTER — Telehealth: Payer: Self-pay

## 2022-11-30 ENCOUNTER — Other Ambulatory Visit (HOSPITAL_COMMUNITY): Payer: Self-pay

## 2022-11-30 NOTE — Telephone Encounter (Signed)
PA request has been Submitted. New Encounter created for follow up. For additional info see Pharmacy Prior Auth telephone encounter from 11/30/2022.

## 2022-11-30 NOTE — Telephone Encounter (Signed)
Pharmacy Patient Advocate Encounter   Received notification from Physician's Office that prior authorization for Vimpat 100MG  Tablet is required/requested.   Insurance verification completed.   The patient is insured through [facility] .   Per test claim: PA required; PA started via CoverMyMeds. KEY [id number] . Waiting for clinical questions to populate.

## 2022-11-30 NOTE — Telephone Encounter (Signed)
Vimpat pa needed

## 2022-12-03 NOTE — Telephone Encounter (Signed)
STATUS UPDATE OF PA?

## 2022-12-03 NOTE — Telephone Encounter (Signed)
Request patient remain on brand Vimpat due to side effects with lacosamide. We can provide patient with samples until we are able to figure out insurance approval so he does not run out. Thank you.

## 2022-12-03 NOTE — Telephone Encounter (Signed)
Clinical questions have been submitted as an urgent request-awaiting determination.

## 2022-12-04 ENCOUNTER — Other Ambulatory Visit (HOSPITAL_COMMUNITY): Payer: Self-pay

## 2022-12-04 NOTE — Telephone Encounter (Signed)
Pharmacy Patient Advocate Encounter  Received notification from [facility] that Prior Authorization for Vimpat 100MG  tablets has been APPROVED from 12/04/2022 to 12/03/2023. Ran test claim, Copay is $unable to obtain due to refill too soon rejection, last filled on 12/03/2022, next fill due 12/26/2022. This test claim was processed through [facility]- copay amounts may vary at other pharmacies due to pharmacy/plan contracts, or as the patient moves through the different stages of their insurance plan.   PA #/Case ID/Reference #: PA Case ID #: [id number]

## 2022-12-11 DIAGNOSIS — D485 Neoplasm of uncertain behavior of skin: Secondary | ICD-10-CM | POA: Diagnosis not present

## 2022-12-11 DIAGNOSIS — L812 Freckles: Secondary | ICD-10-CM | POA: Diagnosis not present

## 2022-12-11 DIAGNOSIS — L821 Other seborrheic keratosis: Secondary | ICD-10-CM | POA: Diagnosis not present

## 2022-12-11 DIAGNOSIS — C434 Malignant melanoma of scalp and neck: Secondary | ICD-10-CM | POA: Diagnosis not present

## 2022-12-11 DIAGNOSIS — Z85828 Personal history of other malignant neoplasm of skin: Secondary | ICD-10-CM | POA: Diagnosis not present

## 2022-12-11 DIAGNOSIS — L84 Corns and callosities: Secondary | ICD-10-CM | POA: Diagnosis not present

## 2022-12-25 DIAGNOSIS — C434 Malignant melanoma of scalp and neck: Secondary | ICD-10-CM | POA: Diagnosis not present

## 2023-01-02 DIAGNOSIS — C434 Malignant melanoma of scalp and neck: Secondary | ICD-10-CM | POA: Diagnosis not present

## 2023-01-08 DIAGNOSIS — H2513 Age-related nuclear cataract, bilateral: Secondary | ICD-10-CM | POA: Diagnosis not present

## 2023-01-08 DIAGNOSIS — D3131 Benign neoplasm of right choroid: Secondary | ICD-10-CM | POA: Diagnosis not present

## 2023-01-09 ENCOUNTER — Ambulatory Visit: Payer: Medicare Other

## 2023-01-09 VITALS — BP 120/62 | HR 65 | Temp 97.8°F | Ht 71.0 in | Wt 196.2 lb

## 2023-01-09 DIAGNOSIS — Z0000 Encounter for general adult medical examination without abnormal findings: Secondary | ICD-10-CM | POA: Diagnosis not present

## 2023-01-09 DIAGNOSIS — Z1211 Encounter for screening for malignant neoplasm of colon: Secondary | ICD-10-CM

## 2023-01-09 NOTE — Progress Notes (Signed)
Subjective:   James Myers is a 72 y.o. male who presents for Medicare Annual/Subsequent preventive examination.  Visit Complete: In person  Patient Medicare AWV questionnaire was completed by the patient on 01/08/23; I have confirmed that all information answered by patient is correct and no changes since this date.  Cardiac Risk Factors include: advanced age (>59men, >44 women);male gender     Objective:    Today's Vitals   01/09/23 0927  BP: 120/62  Pulse: 65  Temp: 97.8 F (36.6 C)  TempSrc: Oral  SpO2: 98%  Weight: 196 lb 3.2 oz (89 kg)  Height: 5\' 11"  (1.803 m)   Body mass index is 27.36 kg/m.     01/09/2023    9:36 AM 01/04/2022    1:52 PM 01/03/2021    1:58 PM 11/18/2019    9:55 AM 12/28/2015    2:39 PM  Advanced Directives  Does Patient Have a Medical Advance Directive? Yes Yes Yes Yes Yes  Type of Estate agent of [place];Living will Healthcare Power of [place];Living will Healthcare Power of [place];Living will  Healthcare Power of [place];Living will  Does patient want to make changes to medical advance directive? No - Patient declined No - Patient declined No - Patient declined Yes (MAU/Ambulatory/Procedural Areas - Information given)   Copy of Healthcare Power of Attorney in Chart? Yes - validated most recent copy scanned in chart (See row information) Yes - validated most recent copy scanned in chart (See row information) No - copy requested      Current Medications (verified) Outpatient Encounter Medications as of 01/09/2023  Medication Sig   Black Pepper-Turmeric 3-500 MG CAPS    diclofenac Sodium (VOLTAREN) 1 % GEL Apply 2 g topically 4 (four) times daily as needed. Apply topically 4 (four) times daily as needed.   Multiple Vitamins-Iron (MULTIVITAMINS WITH IRON) TABS tablet Take 1 tablet by mouth daily.   Omega-3 Fatty Acids (FISH OIL) 1200 MG CPDR    rosuvastatin (CRESTOR) 10 MG tablet Take 1 tablet (10 mg total) by mouth  daily.   Tart Cherry 1200 MG CAPS    triamcinolone cream (KENALOG) 0.1 % SMARTSIG:1 Sparingly Topical Twice Daily   TURMERIC PO Take by mouth.   VIMPAT 100 MG TABS Take 1 tablet (100 mg total) by mouth in the morning and at bedtime.   No facility-administered encounter medications on file as of 01/09/2023.    Allergies (verified) Patient has no active allergies.   History: Past Medical History:  Diagnosis Date   Arthritis    Brain aneurysm    Chicken pox    GERD (gastroesophageal reflux disease)    Melanoma (HCC)    Stroke [facility])    Past Surgical History:  Procedure Laterality Date   BRAIN SURGERY  1991   TONSILLECTOMY AND ADENOIDECTOMY     Family History  Problem Relation Age of Onset   Aneurysm Mother        brain    Arthritis Mother    Parkinson's disease Mother    Stroke Father    Arthritis Father    Syncope episode Father    Arthritis Maternal Grandmother    Diabetes Maternal Grandfather    Arthritis Paternal Grandmother    Tremor Brother    Social History   Socioeconomic History   Marital status: Married    Spouse name: Not on file   Number of children: 1   Years of education: 16   Highest education level: Bachelor's degree (e.g., BA, AB,  BS)  Occupational History   Occupation: Retired     Comment: Firefighter for [employer]  Tobacco Use   Smoking status: Never   Smokeless tobacco: Never  Substance and Sexual Activity   Alcohol use: Yes    Comment: 4 oz daily   Drug use: No   Sexual activity: Yes  Other Topics Concern   Not on file  Social History Narrative   Born and raised in [place]. Currently resided in a private residence with his wife. Denies pets.    Fun: Golf and likes to swim / yard-work   Denies religious beliefs effecting healthcare.   One Child    Two grandchildren       Social Determinants of Health   Financial Resource Strain: Low Risk  (01/08/2023)   Overall Financial Resource Strain (CARDIA)    Difficulty of Paying Living  Expenses: Not hard at all  Food Insecurity: No Food Insecurity (01/09/2023)   Hunger Vital Sign    Worried About Running Out of Food in the Last Year: Never true    Ran Out of Food in the Last Year: Never true  Transportation Needs: No Transportation Needs (01/08/2023)   PRAPARE - Administrator, Civil Service (Medical): No    Lack of Transportation (Non-Medical): No  Physical Activity: Sufficiently Active (01/08/2023)   Exercise Vital Sign    Days of Exercise per Week: 4 days    Minutes of Exercise per Session: 50 min  Stress: No Stress Concern Present (01/08/2023)   [employer] of Occupational Health - Occupational Stress Questionnaire    Feeling of Stress : Not at all  Social Connections: Moderately Isolated (01/08/2023)   Social Connection and Isolation Panel [NHANES]    Frequency of Communication with Friends and Family: Twice a week    Frequency of Social Gatherings with Friends and Family: Three times a week    Attends Religious Services: Never    Active Member of Clubs or Organizations: No    Attends Engineer, structural: Never    Marital Status: Married    Tobacco Counseling Counseling given: Not Answered   Clinical Intake:  Pre-visit preparation completed: Yes  Pain : No/denies pain     BMI - recorded: 27.36 Nutritional Status: BMI 25 -29 Overweight Nutritional Risks: None Diabetes: No  How often do you need to have someone help you when you read instructions, pamphlets, or other written materials from your doctor or pharmacy?: 1 - Never  Interpreter Needed?: No  Information entered by :: Theresa Mulligan LPN   Activities of Daily Living    01/08/2023    8:38 PM 01/05/2023    9:50 AM  In your present state of health, do you have any difficulty performing the following activities:  Hearing? 0 0  Vision? 0 0  Difficulty concentrating or making decisions? 0 0  Walking or climbing stairs? 0 0  Dressing or bathing? 0 0  Doing  errands, shopping? 0 0  Preparing Food and eating ? N N  Using the Toilet? N N  In the past six months, have you accidently leaked urine? N N  Do you have problems with loss of bowel control? N N  Managing your Medications? N N  Managing your Finances? N N  Housekeeping or managing your Housekeeping? N N    Patient Care Team: Shirline Frees, NP as PCP - General (Family Medicine) Lennette Bihari, MD as PCP - Cardiology (Cardiology)  Indicate any recent Medical Services you  may have received from other than [facility] providers in the past year (date may be approximate).     Assessment:   This is a routine wellness examination for [facility].  Hearing/Vision screen Hearing Screening - Comments:: Denies hearing difficulties   Vision Screening - Comments:: Wears reading glasses - up to date with routine eye exams with  Dr Dione Booze   Goals Addressed               This Visit's Progress     Stay Healthy (pt-stated)         Depression Screen    01/09/2023    9:33 AM 01/04/2022    1:50 PM 01/03/2021    1:54 PM 11/24/2020    8:01 AM 11/18/2019   10:00 AM  PHQ 2/9 Scores  PHQ - 2 Score 0 0 0 0 0  PHQ- 9 Score     0    Fall Risk    01/09/2023    9:36 AM 01/08/2023    8:38 PM 01/05/2023    9:50 AM 10/30/2022   11:11 AM 01/04/2022    1:52 PM  Fall Risk   Falls in the past year? 0 0 0 0 1  Number falls in past yr: 0    0  Injury with Fall? 0    0  Risk for fall due to : No Fall Risks    No Fall Risks  Follow up Falls prevention discussed    Falls prevention discussed    MEDICARE RISK AT HOME: Medicare Risk at Home Any stairs in or around the home?: Yes If so, are there any without handrails?: No Home free of loose throw rugs in walkways, pet beds, electrical cords, etc?: Yes Adequate lighting in your home to reduce risk of falls?: Yes Life alert?: No Use of a cane, walker or w/c?: No Grab bars in the bathroom?: No Shower chair or bench in shower?: No Elevated toilet seat or a  handicapped toilet?: No  TIMED UP AND GO:  Was the test performed?  Yes  Length of time to ambulate 10 feet: 10 sec Gait steady and fast without use of assistive device    Cognitive Function:        01/09/2023    9:36 AM 01/04/2022    1:53 PM 01/03/2021    1:59 PM 11/18/2019   10:04 AM  6CIT Screen  What Year? 0 points 0 points 0 points 0 points  What month? 0 points 0 points 0 points 0 points  What time? 0 points 0 points 0 points 0 points  Count back from 20 0 points 0 points 0 points 0 points  Months in reverse 0 points 0 points 0 points 0 points  Repeat phrase 0 points 0 points 0 points 0 points  Total Score 0 points 0 points 0 points 0 points    Immunizations Immunization History  Administered Date(s) Administered   Fluad Quad(high Dose 65+) 10/06/2019   Fluad Trivalent(High Dose 65+) 10/31/2022   Influenza, High Dose Seasonal PF 12/09/2015, 10/24/2018   Influenza-Unspecified 10/24/2018, 11/03/2020, 10/28/2021   PFIZER Comirnaty(Gray Top)Covid-19 Tri-Sucrose Vaccine 06/23/2020, 11/03/2020   PFIZER(Purple Top)SARS-COV-2 Vaccination 02/18/2019, 03/11/2019, 11/05/2019, 11/03/2020   Pfizer Covid-19 Vaccine Bivalent Booster 49yrs & up 06/23/2021, 10/19/2021, 08/13/2022   Pneumococcal Conjugate-13 07/22/2017   Pneumococcal Polysaccharide-23 12/01/2013, 11/18/2019   Rotavirus,unspecified  01/08/2022   Tdap 12/10/2022   Zoster Recombinant(Shingrix) 10/24/2018, 01/13/2019   TDAP: Up to date  Flu Vaccine status: Up to date  Pneumococcal  vaccine status: Up to date  Covid-19 vaccine status: Declined, Education has been provided regarding the importance of this vaccine but patient still declined. Advised may receive this vaccine at local pharmacy or Health Dept.or vaccine clinic. Aware to provide a copy of the vaccination record if obtained from local pharmacy or Health Dept. Verbalized acceptance and understanding.  Qualifies for Shingles Vaccine? Yes   Zostavax completed  Yes   Shingrix Completed?: Yes  Screening Tests Health Maintenance  Topic Date Due   Colonoscopy  06/07/2020   COVID-19 Vaccine (9 - 2023-24 season) 10/08/2022   Medicare Annual Wellness (AWV)  01/09/2024   DTaP/Tdap/Td (2 - Td or Tdap) 12/09/2032   Pneumonia Vaccine 58+ Years old  Completed   INFLUENZA VACCINE  Completed   Hepatitis C Screening  Completed   Zoster Vaccines- Shingrix  Completed   HPV VACCINES  Aged Out    Health Maintenance  Health Maintenance Due  Topic Date Due   Colonoscopy  06/07/2020   COVID-19 Vaccine (9 - 2023-24 season) 10/08/2022    Colorectal cancer screening: Referral to GI placed 01/09/23. Pt aware the office will call re: appt.     Additional Screening:  Hepatitis C Screening: does qualify; Completed 11/24/20  Vision Screening: Recommended annual ophthalmology exams for early detection of glaucoma and other disorders of the eye. Is the patient up to date with their annual eye exam?  Yes  Who is the provider or what is the name of the office in which the patient attends annual eye exams? Dr Dione Booze If pt is not established with a provider, would they like to be referred to a provider to establish care? No .   Dental Screening: Recommended annual dental exams for proper oral hygiene    Community Resource Referral / Chronic Care Management:  CRR required this visit?  No   CCM required this visit?  No     Plan:     I have personally reviewed and noted the following in the patient's chart:   Medical and social history Use of alcohol, tobacco or illicit drugs  Current medications and supplements including opioid prescriptions. Patient is not currently taking opioid prescriptions. Functional ability and status Nutritional status Physical activity Advanced directives List of other physicians Hospitalizations, surgeries, and ER visits in previous 12 months Vitals Screenings to include cognitive, depression, and falls Referrals and  appointments  In addition, I have reviewed and discussed with patient certain preventive protocols, quality metrics, and best practice recommendations. A written personalized care plan for preventive services as well as general preventive health recommendations were provided to patient.     Tillie Rung, LPN   [id number]   After Visit Summary: (MyChart) Due to this being a telephonic visit, the after visit summary with patients personalized plan was offered to patient via MyChart   Nurse Notes: None

## 2023-01-09 NOTE — Patient Instructions (Addendum)
James Myers , Thank you for taking time to come for your Medicare Wellness Visit. I appreciate your ongoing commitment to your health goals. Please review the following plan we discussed and let me know if I can assist you in the future.   Referrals/Orders/Follow-Ups/Clinician Recommendations:   This is a list of the screening recommended for you and due dates:  Health Maintenance  Topic Date Due   Colon Cancer Screening  06/07/2020   COVID-19 Vaccine (9 - 2023-24 season) 10/08/2022   Medicare Annual Wellness Visit  01/09/2024   DTaP/Tdap/Td vaccine (2 - Td or Tdap) 12/09/2032   Pneumonia Vaccine  Completed   Flu Shot  Completed   Hepatitis C Screening  Completed   Zoster (Shingles) Vaccine  Completed   HPV Vaccine  Aged Out    Advanced directives: (In Chart) A copy of your advanced directives are scanned into your chart should your provider ever need it.  Next Medicare Annual Wellness Visit scheduled for next year: Yes

## 2023-01-10 DIAGNOSIS — R590 Localized enlarged lymph nodes: Secondary | ICD-10-CM | POA: Diagnosis not present

## 2023-01-10 DIAGNOSIS — C434 Malignant melanoma of scalp and neck: Secondary | ICD-10-CM | POA: Diagnosis not present

## 2023-01-11 DIAGNOSIS — C439 Malignant melanoma of skin, unspecified: Secondary | ICD-10-CM | POA: Diagnosis not present

## 2023-01-11 DIAGNOSIS — M898X8 Other specified disorders of bone, other site: Secondary | ICD-10-CM | POA: Diagnosis not present

## 2023-01-11 DIAGNOSIS — E785 Hyperlipidemia, unspecified: Secondary | ICD-10-CM | POA: Diagnosis not present

## 2023-01-11 DIAGNOSIS — C434 Malignant melanoma of scalp and neck: Secondary | ICD-10-CM | POA: Diagnosis not present

## 2023-01-11 HISTORY — PX: OTHER SURGICAL HISTORY: SHX169

## 2023-01-12 ENCOUNTER — Encounter: Payer: Self-pay | Admitting: Adult Health

## 2023-01-15 ENCOUNTER — Other Ambulatory Visit: Payer: Self-pay | Admitting: Medical Genetics

## 2023-01-15 NOTE — Telephone Encounter (Signed)
FYI

## 2023-01-16 DIAGNOSIS — Z09 Encounter for follow-up examination after completed treatment for conditions other than malignant neoplasm: Secondary | ICD-10-CM | POA: Diagnosis not present

## 2023-01-16 DIAGNOSIS — C434 Malignant melanoma of scalp and neck: Secondary | ICD-10-CM | POA: Diagnosis not present

## 2023-01-24 DIAGNOSIS — Z09 Encounter for follow-up examination after completed treatment for conditions other than malignant neoplasm: Secondary | ICD-10-CM | POA: Diagnosis not present

## 2023-01-24 DIAGNOSIS — C434 Malignant melanoma of scalp and neck: Secondary | ICD-10-CM | POA: Diagnosis not present

## 2023-02-04 DIAGNOSIS — Z09 Encounter for follow-up examination after completed treatment for conditions other than malignant neoplasm: Secondary | ICD-10-CM | POA: Diagnosis not present

## 2023-02-04 DIAGNOSIS — C434 Malignant melanoma of scalp and neck: Secondary | ICD-10-CM | POA: Diagnosis not present

## 2023-02-12 ENCOUNTER — Other Ambulatory Visit (HOSPITAL_COMMUNITY)
Admission: RE | Admit: 2023-02-12 | Discharge: 2023-02-12 | Disposition: A | Discharge status: Home / Self Care | Payer: Self-pay | Source: Ambulatory Visit | Attending: Medical Genetics | Admitting: Medical Genetics

## 2023-02-22 LAB — GENECONNECT MOLECULAR SCREEN: Genetic Analysis Overall Interpretation: NEGATIVE

## 2023-03-20 DIAGNOSIS — L84 Corns and callosities: Secondary | ICD-10-CM | POA: Diagnosis not present

## 2023-03-20 DIAGNOSIS — L570 Actinic keratosis: Secondary | ICD-10-CM | POA: Diagnosis not present

## 2023-03-20 DIAGNOSIS — Z85828 Personal history of other malignant neoplasm of skin: Secondary | ICD-10-CM | POA: Diagnosis not present

## 2023-03-20 DIAGNOSIS — C44329 Squamous cell carcinoma of skin of other parts of face: Secondary | ICD-10-CM | POA: Diagnosis not present

## 2023-03-20 DIAGNOSIS — D2271 Melanocytic nevi of right lower limb, including hip: Secondary | ICD-10-CM | POA: Diagnosis not present

## 2023-03-20 DIAGNOSIS — L821 Other seborrheic keratosis: Secondary | ICD-10-CM | POA: Diagnosis not present

## 2023-03-20 DIAGNOSIS — Z85820 Personal history of malignant melanoma of skin: Secondary | ICD-10-CM | POA: Diagnosis not present

## 2023-03-20 DIAGNOSIS — D485 Neoplasm of uncertain behavior of skin: Secondary | ICD-10-CM | POA: Diagnosis not present

## 2023-03-28 ENCOUNTER — Other Ambulatory Visit: Payer: Self-pay | Admitting: Adult Health

## 2023-03-28 DIAGNOSIS — G40209 Localization-related (focal) (partial) symptomatic epilepsy and epileptic syndromes with complex partial seizures, not intractable, without status epilepticus: Secondary | ICD-10-CM

## 2023-03-28 NOTE — Telephone Encounter (Signed)
 Last seen on 08/07/22 Follow up scheduled on 08/07/23 Last filled on 02/28/23 #60 tablets (30 day supply) Rx pending to be signed

## 2023-04-02 ENCOUNTER — Encounter: Payer: Self-pay | Admitting: Gastroenterology

## 2023-04-25 ENCOUNTER — Other Ambulatory Visit: Payer: Self-pay | Admitting: Adult Health

## 2023-04-25 DIAGNOSIS — E782 Mixed hyperlipidemia: Secondary | ICD-10-CM

## 2023-05-22 ENCOUNTER — Other Ambulatory Visit: Payer: Self-pay | Admitting: Adult Health

## 2023-05-22 DIAGNOSIS — E782 Mixed hyperlipidemia: Secondary | ICD-10-CM

## 2023-05-28 ENCOUNTER — Encounter: Payer: Self-pay | Admitting: Gastroenterology

## 2023-05-28 ENCOUNTER — Ambulatory Visit (INDEPENDENT_AMBULATORY_CARE_PROVIDER_SITE_OTHER): Admitting: Gastroenterology

## 2023-05-28 VITALS — BP 112/64 | HR 65 | Ht 71.0 in | Wt 196.0 lb

## 2023-05-28 DIAGNOSIS — Z860100 Personal history of colon polyps, unspecified: Secondary | ICD-10-CM

## 2023-05-28 DIAGNOSIS — K5900 Constipation, unspecified: Secondary | ICD-10-CM | POA: Diagnosis not present

## 2023-05-28 DIAGNOSIS — K219 Gastro-esophageal reflux disease without esophagitis: Secondary | ICD-10-CM | POA: Diagnosis not present

## 2023-05-28 DIAGNOSIS — R194 Change in bowel habit: Secondary | ICD-10-CM

## 2023-05-28 MED ORDER — NA SULFATE-K SULFATE-MG SULF 17.5-3.13-1.6 GM/177ML PO SOLN
1.0000 | ORAL | 0 refills | Status: DC
Start: 2023-05-28 — End: 2023-12-10

## 2023-05-28 NOTE — Patient Instructions (Signed)
 You have been scheduled for an endoscopy and colonoscopy. Please follow the written instructions given to you at your visit today.  If you use inhalers (even only as needed), please bring them with you on the day of your procedure.  I appreciate the opportunity to care for you. Lorella Roles, MD

## 2023-05-28 NOTE — Progress Notes (Signed)
 [facility] Consult Note:  History: James Myers 05/28/2023  Referring provider: Alto Atta, NP  Reason for consult/chief complaint: Colonoscopy (Pt states he is doing well today, pt is here to discuss having another colonoscopy, pt states he been having seizures and is taking Rx for the seizures, pt states he has a fear of anesthesiologist )   Subjective  Prior history:  Establish care in office with Dr. Dominic Friendly August 2019 reporting history of colon polyps and many years of GERD symptoms with endoscopic procedures at least 10 years prior in [place].   Prior endoscopic reports were not available.  Patient reported his reflux had improved over the years with diet and lifestyle changes. Was scheduled for EGD (Barrett's screening) and colonoscopy (history of polyps) but then canceled after message from primary care noting this patient developed recurrent syncope requiring workup. Note from Dr. Magnus Schuller December 2019 details the patient's negative cardiac workup and evaluation by neurology and treatment for complex partial seizures with Keppra . Neurology notes on file in this EHR - last visit in July 2024- no sz activity for well over a year on Vimpat .  Discussed the use of AI scribe software for clinical note transcription with the patient, who gave verbal consent to proceed.  History of Present Illness James Myers is a 73 year old male who presents with gastrointestinal issues and consideration of endoscopic procedures. He has a history of colon polyps and had been experiencing reflux symptoms for a long period years ago (see my prior note). He manages his reflux by elevating the head of his bed and being cautious with his diet.  No current heartburn issues are reported.  He mentions a change in bowel habits starting six to seven months ago, characterized by difficulty in passing stools and requiring effort. He began drinking prune juice, which has improved his  bowel movements back to normal.  Denies rectal bleeding. He has a history of syncope episodes, initially suspected to be cardiac in nature, but later diagnosed as complex partial seizures by a neurologist. He is currently on Vimpat , which has been effective in managing his seizures after experiencing adverse reactions to previous medications like Keppra , which caused sleep problems. He has stopped consuming alcohol due to its impact on his seizure condition, noting that even a few sips would cause him to pass out.  He has a history of melanoma on the scalp, for which he underwent a full body PET scan that returned favorable results (brought a copy of the report with him today). He expresses a fear of anesthesia due to past experiences and concerns about triggering seizures.   Denies dysphagia, odynophagia, nausea vomiting loss of appetite or weight loss.   ROS:  Review of Systems  Constitutional:  Negative for appetite change and unexpected weight change.  HENT:  Negative for mouth sores and voice change.   Eyes:  Negative for pain and redness.  Respiratory:  Negative for cough and shortness of breath.   Cardiovascular:  Negative for chest pain and palpitations.  Genitourinary:  Positive for frequency. Negative for dysuria and hematuria.  Musculoskeletal:  Positive for arthralgias. Negative for myalgias.  Skin:  Negative for pallor and rash.  Neurological:  Negative for weakness and headaches.  Hematological:  Negative for adenopathy.     Past Medical History: Past Medical History:  Diagnosis Date   Arthritis    Brain aneurysm    Chicken pox    GERD (gastroesophageal reflux disease)    Melanoma (HCC)  Stroke [facility])      Past Surgical History: Past Surgical History:  Procedure Laterality Date   BRAIN SURGERY  1991   TONSILLECTOMY AND ADENOIDECTOMY       Family History: Family History  Problem Relation Age of Onset   Aneurysm Mother        brain    Arthritis Mother     Parkinson's disease Mother    Stroke Father    Arthritis Father    Syncope episode Father    Tremor Brother    Arthritis Maternal Grandmother    Diabetes Maternal Grandfather    Arthritis Paternal Grandmother    Colon cancer Neg Hx    Stomach cancer Neg Hx    Esophageal cancer Neg Hx     Social History: Social History   Socioeconomic History   Marital status: Married    Spouse name: Not on file   Number of children: 1   Years of education: 16   Highest education level: Bachelor's degree (e.g., BA, AB, BS)  Occupational History   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 Currently resided in a private residence with his wife. Denies pets.    Fun: Golf and likes to swim / yard-work   Denies religious beliefs effecting healthcare.   One Child    Two grandchildren       Social Drivers of Corporate investment banker Strain: Low Risk  (01/08/2023)   Overall Financial Resource Strain (CARDIA)    Difficulty of Paying Living Expenses: Not hard at all  Food Insecurity: No Food Insecurity (01/09/2023)   Hunger Vital Sign    Worried About Running Out of Food in the Last Year: Never true    Ran Out of Food in the Last Year: Never true  Transportation Needs: No Transportation Needs (01/08/2023)   PRAPARE - Administrator, Civil Service (Medical): No    Lack of Transportation (Non-Medical): No  Physical Activity: Sufficiently Active (01/08/2023)   Exercise Vital Sign    Days of Exercise per Week: 4 days    Minutes of Exercise per Session: 50 min  Stress: No Stress Concern Present (01/08/2023)   [employer] of  Occupational Health - Occupational Stress Questionnaire    Feeling of Stress : Not at all  Social Connections: Moderately Isolated (01/08/2023)   Social Connection and Isolation Panel [NHANES]    Frequency of Communication with Friends and Family: Twice a week    Frequency of Social Gatherings with Friends and Family: Three times a week    Attends Religious Services: Never    Active Member of Clubs or Organizations: No    Attends Banker Meetings: Never    Marital Status: Married    Allergies: No Active Allergies  Outpatient Meds: Current Outpatient Medications  Medication Sig Dispense Refill   Black Pepper-Turmeric 3-500 MG CAPS      diclofenac  Sodium (VOLTAREN ) 1 % GEL Apply 2 g topically 4 (four) times daily as needed. Apply topically 4 (four) times daily as needed. 150 g 6  Multiple Vitamins-Iron (MULTIVITAMINS WITH IRON) TABS tablet Take 1 tablet by mouth daily.     rosuvastatin  (CRESTOR ) 10 MG tablet TAKE 1 TABLET BY MOUTH DAILY 30 tablet 0   Tart Cherry 1200 MG CAPS      triamcinolone cream (KENALOG) 0.1 % SMARTSIG:1 Sparingly Topical Twice Daily     VIMPAT  100 MG TABS TAKE 1 TABLET BY MOUTH EVERY MORNING AND TAKE 1 TABLET BY MOUTH EVERY NIGHT AT BEDTIME 60 tablet 5   Omega-3 Fatty Acids (FISH OIL) 1200 MG CPDR  (Patient not taking: Reported on 05/28/2023)     TURMERIC PO Take by mouth. (Patient not taking: Reported on 05/28/2023)     No current facility-administered medications for this visit.      ___________________________________________________________________ Objective   Exam:  BP 112/64   Pulse 65   Ht 5\' 11"  (1.803 m)   Wt 196 lb (88.9 kg)   BMI 27.34 kg/m  Wt Readings from Last 3 Encounters:  05/28/23 196 lb (88.9 kg)  01/09/23 196 lb 3.2 oz (89 kg)  10/31/22 196 lb (88.9 kg)    General: Well-appearing, normal vocal quality, gets on exam table independently Eyes: sclera anicteric, no redness ENT: oral mucosa moist without lesions, no  cervical or supraclavicular lymphadenopathy CV: Regular without appreciable murmur, no JVD, no peripheral edema Resp: clear to auscultation bilaterally, normal RR and effort noted GI: soft, no tenderness, with active bowel sounds. No guarding or palpable organomegaly noted. Skin; warm and dry, no rash or jaundice noted.  Scar under left axilla and multiple scars on scalp from melanoma removal. Neuro: awake, alert and oriented x 3. Normal gross motor function and fluent speech    Radiologic Studies:  December 2024 PET scan with activity on the scalp coinciding with his melanoma, otherwise normal exam.   Encounter Diagnoses  Name Primary?   Gastroesophageal reflux disease without esophagitis Yes   Hx of colonic polyps    Change in bowel habits     Assessment and Plan Assessment & Plan Gastroesophageal reflux disease (GERD) Long-standing GERD well-controlled with lifestyle modifications. Barrett's screening warranted.  Constipation Recent bowel habit changes improved with prune juice.  Complex partial seizures Seizures controlled with Vimpat . Discussed anesthesia concerns and reassured safety of propofol . - Schedule upper endoscopy and colonoscopy with anesthesia support. - Continue Vimpat  as prescribed.    Perley expressed concerns about sedation for endoscopic procedures with his seizure disorder history. His seizure disorder has been under control on medicine for years and propofol  used for these endoscopic procedures he is currently considered a safe medicine to use patients with seizure disorder.  (Having discussed this in the past with our anesthesia providers) He also asked whether a Cologuard may be an option, but I explained that it is not indicated for patients considered at increased risk of colon polyps and cancer because of a personal history of colon polyps.  After that discussion, he ultimately decided to move forward scheduling an EGD and colonoscopy.   The  benefits and risks of the planned procedure(s) were described in detail with the patient or (when appropriate) their health care proxy.  Risks were outlined as including, but not limited to, bleeding, infection, perforation, adverse medication reaction leading to cardiac or pulmonary decompensation, pancreatitis (if ERCP).  The limitation of incomplete mucosal visualization was also discussed.  No guarantees or warranties were given.    Thank you for the courtesy of this consult.  Please call me with any questions or concerns.  Roel Clarity  Danis III  CC: Referring provider noted above

## 2023-06-19 ENCOUNTER — Other Ambulatory Visit: Payer: Self-pay | Admitting: Adult Health

## 2023-06-19 DIAGNOSIS — L570 Actinic keratosis: Secondary | ICD-10-CM | POA: Diagnosis not present

## 2023-06-19 DIAGNOSIS — L812 Freckles: Secondary | ICD-10-CM | POA: Diagnosis not present

## 2023-06-19 DIAGNOSIS — L853 Xerosis cutis: Secondary | ICD-10-CM | POA: Diagnosis not present

## 2023-06-19 DIAGNOSIS — Z85828 Personal history of other malignant neoplasm of skin: Secondary | ICD-10-CM | POA: Diagnosis not present

## 2023-06-19 DIAGNOSIS — L821 Other seborrheic keratosis: Secondary | ICD-10-CM | POA: Diagnosis not present

## 2023-06-19 DIAGNOSIS — Z85820 Personal history of malignant melanoma of skin: Secondary | ICD-10-CM | POA: Diagnosis not present

## 2023-06-19 DIAGNOSIS — D1801 Hemangioma of skin and subcutaneous tissue: Secondary | ICD-10-CM | POA: Diagnosis not present

## 2023-06-19 DIAGNOSIS — E782 Mixed hyperlipidemia: Secondary | ICD-10-CM

## 2023-07-10 ENCOUNTER — Encounter: Payer: Self-pay | Admitting: Gastroenterology

## 2023-07-10 ENCOUNTER — Ambulatory Visit: Admitting: Gastroenterology

## 2023-07-10 VITALS — BP 122/68 | HR 66 | Temp 98.0°F | Resp 10 | Ht 71.0 in | Wt 196.0 lb

## 2023-07-10 DIAGNOSIS — K648 Other hemorrhoids: Secondary | ICD-10-CM | POA: Diagnosis not present

## 2023-07-10 DIAGNOSIS — Z13810 Encounter for screening for upper gastrointestinal disorder: Secondary | ICD-10-CM | POA: Diagnosis not present

## 2023-07-10 DIAGNOSIS — Z860100 Personal history of colon polyps, unspecified: Secondary | ICD-10-CM | POA: Diagnosis not present

## 2023-07-10 DIAGNOSIS — Z1211 Encounter for screening for malignant neoplasm of colon: Secondary | ICD-10-CM

## 2023-07-10 DIAGNOSIS — K219 Gastro-esophageal reflux disease without esophagitis: Secondary | ICD-10-CM

## 2023-07-10 MED ORDER — SODIUM CHLORIDE 0.9 % IV SOLN: 500.0000 mL | Freq: Once | INTRAVENOUS | Status: DC

## 2023-07-10 NOTE — Patient Instructions (Signed)
 Educational handout provided to patient related to Hemorrhoids  Resume previous diet  Continue present medications  NO REPEAT COLONOSCOPY DUE TO AGE  YOU HAD AN ENDOSCOPIC PROCEDURE TODAY AT THE [facility]:   Refer to the procedure report that was given to you for any specific questions about what was found during the examination.  If the procedure report does not answer your questions, please call your gastroenterologist to clarify.  If you requested that your care partner not be given the details of your procedure findings, then the procedure report has been included in a sealed envelope for you to review at your convenience later.  YOU SHOULD EXPECT: Some feelings of bloating in the abdomen. Passage of more gas than usual.  Walking can help get rid of the air that was put into your GI tract during the procedure and reduce the bloating. If you had a lower endoscopy (such as a colonoscopy or flexible sigmoidoscopy) you may notice spotting of blood in your stool or on the toilet paper. If you underwent a bowel prep for your procedure, you may not have a normal bowel movement for a few days.  Please Note:  You might notice some irritation and congestion in your nose or some drainage.  This is from the oxygen used during your procedure.  There is no need for concern and it should clear up in a day or so.  SYMPTOMS TO REPORT IMMEDIATELY:  Following lower endoscopy (colonoscopy or flexible sigmoidoscopy):  Excessive amounts of blood in the stool  Significant tenderness or worsening of abdominal pains  Swelling of the abdomen that is new, acute  Fever of 100F or higher  Following upper endoscopy (EGD)  Vomiting of blood or coffee ground material  New chest pain or pain under the shoulder blades  Painful or persistently difficult swallowing  New shortness of breath  Fever of 100F or higher  Black, tarry-looking stools  For urgent or emergent issues, a gastroenterologist can be  reached at any hour by calling [phone]. Do not use MyChart messaging for urgent concerns.    DIET:  We do recommend a small meal at first, but then you may proceed to your regular diet.  Drink plenty of fluids but you should avoid alcoholic beverages for 24 hours.  ACTIVITY:  You should plan to take it easy for the rest of today and you should NOT DRIVE or use heavy machinery until tomorrow (because of the sedation medicines used during the test).    FOLLOW UP: Our staff will call the number listed on your records the next business day following your procedure.  We will call around 7:15- 8:00 am to check on you and address any questions or concerns that you may have regarding the information given to you following your procedure. If we do not reach you, we will leave a message.     If any biopsies were taken you will be contacted by phone or by letter within the next 1-3 weeks.  Please call us  at [phone] if you have not heard about the biopsies in 3 weeks.    SIGNATURES/CONFIDENTIALITY: You and/or your care partner have signed paperwork which will be entered into your electronic medical record.  These signatures attest to the fact that that the information above on your After Visit Summary has been reviewed and is understood.  Full responsibility of the confidentiality of this discharge information lies with you and/or your care-partner.

## 2023-07-10 NOTE — Progress Notes (Signed)
 History and Physical:  This patient presents for endoscopic testing for: Encounter Diagnoses  Name Primary?   Gastroesophageal reflux disease without esophagitis Yes   Hx of colonic polyps    73 year old man here today for endoscopic evaluation of longstanding GERD symptoms as well as a history of colon polyps. Further clinical details in my office consult note dated 05/28/2023 History of colon polyps, probable benign change in bowel habits, reflux symptoms managed with diet, denies dysphagia.  Patient is otherwise without complaints or active issues today.   Past Medical History: Past Medical History:  Diagnosis Date   Arthritis    Brain aneurysm    Chicken pox    GERD (gastroesophageal reflux disease)    Melanoma (HCC)    Stroke (HCC)      Past Surgical History: Past Surgical History:  Procedure Laterality Date   BRAIN SURGERY  1991   TONSILLECTOMY AND ADENOIDECTOMY      Allergies: No Active Allergies  Outpatient Meds: Current Outpatient Medications  Medication Sig Dispense Refill   Black Pepper-Turmeric 3-500 MG CAPS      diclofenac  Sodium (VOLTAREN ) 1 % GEL Apply 2 g topically 4 (four) times daily as needed. Apply topically 4 (four) times daily as needed. 150 g 6   Multiple Vitamins-Iron (MULTIVITAMINS WITH IRON) TABS tablet Take 1 tablet by mouth daily.     Omega-3 Fatty Acids (FISH OIL) 1200 MG CPDR      rosuvastatin  (CRESTOR ) 10 MG tablet TAKE 1 TABLET BY MOUTH DAILY 30 tablet 0   Tart Cherry 1200 MG CAPS      VIMPAT  100 MG TABS TAKE 1 TABLET BY MOUTH EVERY MORNING AND TAKE 1 TABLET BY MOUTH EVERY NIGHT AT BEDTIME 60 tablet 5   Na Sulfate-K Sulfate-Mg Sulfate concentrate (SUPREP) 17.5-3.13-1.6 GM/177ML SOLN Take 1 kit (354 mLs total) by mouth as directed. (Patient not taking: Reported on 07/10/2023) 354 mL 0   triamcinolone cream (KENALOG) 0.1 % SMARTSIG:1 Sparingly Topical Twice Daily (Patient not taking: Reported on 07/10/2023)     TURMERIC PO Take by mouth.  (Patient not taking: Reported on 05/28/2023)     Current Facility-Administered Medications  Medication Dose Route Frequency Provider Last Rate Last Admin   0.9 %  sodium chloride infusion  500 mL Intravenous Once Danis, Cartel Mauss L III, MD          ___________________________________________________________________ Objective   Exam:  BP 132/74   Temp 98 F (36.7 C) (Temporal)   Ht 5' 11 (1.803 m)   Wt 196 lb (88.9 kg)   SpO2 100%   BMI 27.34 kg/m   CV: regular , S1/S2 Resp: clear to auscultation bilaterally, normal RR and effort noted GI: soft, no tenderness, with active bowel sounds.   Assessment: Encounter Diagnoses  Name Primary?   Gastroesophageal reflux disease without esophagitis Yes   Hx of colonic polyps      Plan: Colonoscopy EGD  The benefits and risks of the planned procedure(s) were described in detail with the patient or (when appropriate) their health care proxy.  Risks were outlined as including, but not limited to, bleeding, infection, perforation, adverse medication reaction leading to cardiac or pulmonary decompensation, pancreatitis (if ERCP).  The limitation of incomplete mucosal visualization was also discussed.  No guarantees or warranties were given.  The patient is appropriate for an endoscopic procedure in the ambulatory setting.   - Lorella Roles, MD

## 2023-07-10 NOTE — Progress Notes (Signed)
 Sedate, gd SR, tolerated procedure well, VSS, report to RN

## 2023-07-10 NOTE — Op Note (Signed)
 [facility] Patient Name: James Myers Procedure Date: 07/10/2023 1:58 PM MRN: [id number] Endoscopist: Ace Abu L. Dominic Friendly , MD, [id number] Age: 73 Referring MD:  Date of Birth: [birth year] Gender: Male Account #: [id number] Procedure:                Colonoscopy Indications:              Surveillance: Personal history of colonic polyps                            (unknown histology) on last colonoscopy more than 5                            years ago Medicines:                Monitored Anesthesia Care Procedure:                Pre-Anesthesia Assessment:                           - Prior to the procedure, a History and Physical                            was performed, and patient medications and                            allergies were reviewed. The patient's tolerance of                            previous anesthesia was also reviewed. The risks                            and benefits of the procedure and the sedation                            options and risks were discussed with the patient.                            All questions were answered, and informed consent                            was obtained. Prior Anticoagulants: The patient has                            taken no anticoagulant or antiplatelet agents. ASA                            Grade Assessment: III - A patient with severe                            systemic disease. After reviewing the risks and                            benefits, the patient was deemed in satisfactory  condition to undergo the procedure.                           After obtaining informed consent, the colonoscope                            was passed under direct vision. Throughout the                            procedure, the patient's blood pressure, pulse, and                            oxygen saturations were monitored continuously. The                            CF [id number] #[id number] was introduced through the  anus                            and advanced to the the cecum, identified by                            appendiceal orifice and ileocecal valve. The                            colonoscopy was performed without difficulty. The                            patient tolerated the procedure well. The quality                            of the bowel preparation was excellent. The                            ileocecal valve, appendiceal orifice, and rectum                            were photographed. Scope In: 2:18:24 PM Scope Out: 2:33:32 PM Scope Withdrawal Time: 0 hours 12 minutes 32 seconds  Total Procedure Duration: 0 hours 15 minutes 8 seconds  Findings:                 The perianal and digital rectal examinations were                            normal.                           Repeat examination of right colon under NBI                            performed.                           Internal hemorrhoids were found.  The exam was otherwise without abnormality on                            direct and retroflexion views. Complications:            No immediate complications. Estimated Blood Loss:     Estimated blood loss: none. Impression:               - Internal hemorrhoids.                           - The examination was otherwise normal on direct                            and retroflexion views.                           - No specimens collected. Recommendation:           - Patient has a contact number available for                            emergencies. The signs and symptoms of potential                            delayed complications were discussed with the                            patient. Return to normal activities tomorrow.                            Written discharge instructions were provided to the                            patient.                           - Resume previous diet.                           - Continue present medications.                            - No repeat colonoscopy due to age, current                            guidelines and low risk findings today.                           - See the other procedure note for documentation of                            additional recommendations. Denita Lun L. Dominic Friendly, MD 07/10/2023 2:47:48 PM This report has been signed electronically.

## 2023-07-10 NOTE — Op Note (Signed)
 [facility] Patient Name: James Myers Procedure Date: 07/10/2023 1:57 PM MRN: [id number] Endoscopist: Ace Abu L. Dominic Friendly , MD, [id number] Age: 73 Referring MD:  Date of Birth: Apr 23, 1950 Gender: Male Account #: [id number] Procedure:                Upper GI endoscopy Indications:              Gastro-esophageal reflux disease, Screening for                            Barrett's esophagus in patient at risk for this                            condition                           Patient reports symptoms well controlled with diet                            and lifestyle measures Medicines:                Monitored Anesthesia Care Procedure:                Pre-Anesthesia Assessment:                           - Prior to the procedure, a History and Physical                            was performed, and patient medications and                            allergies were reviewed. The patient's tolerance of                            previous anesthesia was also reviewed. The risks                            and benefits of the procedure and the sedation                            options and risks were discussed with the patient.                            All questions were answered, and informed consent                            was obtained. Prior Anticoagulants: The patient has                            taken no anticoagulant or antiplatelet agents. ASA                            Grade Assessment: III - A patient with severe  systemic disease. After reviewing the risks and                            benefits, the patient was deemed in satisfactory                            condition to undergo the procedure.                           After obtaining informed consent, the endoscope was                            passed under direct vision. Throughout the                            procedure, the patient's blood pressure, pulse, and                             oxygen saturations were monitored continuously. The                            GIF [id number] #[id number] was introduced through the                            mouth, and advanced to the second part of duodenum.                            The upper GI endoscopy was accomplished without                            difficulty. The patient tolerated the procedure                            well. Scope In: Scope Out: Findings:                 The esophagus was normal.                           The stomach was normal.                           The cardia and gastric fundus were normal on                            retroflexion.                           The examined duodenum was normal. Complications:            No immediate complications. Estimated Blood Loss:     Estimated blood loss: none. Impression:               - Normal esophagus.                           - Normal stomach.                           -  Normal examined duodenum.                           - No specimens collected. Recommendation:           - Patient has a contact number available for                            emergencies. The signs and symptoms of potential                            delayed complications were discussed with the                            patient. Return to normal activities tomorrow.                            Written discharge instructions were provided to the                            patient.                           - Resume previous diet.                           - Continue present medications.                           - See the other procedure note for documentation of                            additional recommendations. Skylynne Schlechter L. Dominic Friendly, MD 07/10/2023 2:51:03 PM This report has been signed electronically.

## 2023-07-11 ENCOUNTER — Telehealth: Payer: Self-pay

## 2023-07-11 NOTE — Telephone Encounter (Signed)
 No answer, left message to call if having any issues or concerns, B.Vale Mousseau RN

## 2023-07-21 ENCOUNTER — Other Ambulatory Visit: Payer: Self-pay | Admitting: Adult Health

## 2023-07-21 DIAGNOSIS — E782 Mixed hyperlipidemia: Secondary | ICD-10-CM

## 2023-08-07 ENCOUNTER — Ambulatory Visit: Payer: Medicare Other | Admitting: Adult Health

## 2023-08-18 ENCOUNTER — Other Ambulatory Visit: Payer: Self-pay | Admitting: Adult Health

## 2023-08-18 DIAGNOSIS — E782 Mixed hyperlipidemia: Secondary | ICD-10-CM

## 2023-08-23 ENCOUNTER — Ambulatory Visit: Payer: Self-pay

## 2023-08-23 NOTE — Telephone Encounter (Signed)
 FYI Only or Action Required?: FYI only for provider.  Patient was last seen in primary care on 10/31/2022 by Merna Huxley, NP.  Called Nurse Triage reporting Hip Pain.  Symptoms began several months ago.  Interventions attempted: OTC medications: Voltaren , Aspercream and Rest, hydration, or home remedies.  Symptoms are: unchanged.  Triage Disposition: See PCP When Office is Open (Within 3 Days)  Patient/caregiver understands and will follow disposition?: Yes, but will wait  Copied from CRM [id number]. Topic: Clinical - Red Word Triage >> Aug 23, 2023 11:19 AM Gennette ORN wrote: Red Word that prompted transfer to Nurse Triage: Patient is calling in about his left hip , he is in severe pain when he is wallking. When he is sitting and not walking it slightly hurt. This has been going on for 3 months is getting worse. Reason for Disposition  [1] MODERATE pain (e.g., interferes with normal activities, limping) AND [2] present > 3 days  Answer Assessment - Initial Assessment Questions 1. LOCATION and RADIATION: Where is the pain located? Does the pain spread (shoot) anywhere else?     Left hip 2. QUALITY: What does the pain feel like?  (e.g., sharp, dull, aching, burning)     Sharp 3. SEVERITY: How bad is the pain? What does it keep you from doing?   (Scale 1-10; or mild, moderate, severe)     Severe when walking, when sitting mild 4. ONSET: When did the pain start? Does it come and go, or is it there all the time?     3 months intermittent 5. WORK OR EXERCISE: Has there been any recent work or exercise that involved this part of the body?      NA 6. CAUSE: What do you think is causing the hip pain?      Unsure 7. AGGRAVATING FACTORS: What makes the hip pain worse? (e.g., walking, climbing stairs, running)     Ambulating. Carrying objects make this worse.  8. OTHER SYMPTOMS: Do you have any other symptoms? (e.g., back pain, pain shooting down leg,  fever, rash)      Denies.   Additional info; Uses Voltaren  and Aspercream on his joints with good effect, this however is not helping with the hip pain. Wondering if he could receive an injection. Requesting to see pcp. Scheduled.  Protocols used: Hip Pain-A-AH

## 2023-08-27 ENCOUNTER — Ambulatory Visit: Admitting: Adult Health

## 2023-09-03 NOTE — Progress Notes (Unsigned)
 [facility] [address] [place]. [place] [address] [phone]       OFFICE FOLLOW UP NOTE  [name] Date of Birth:  February 24, 1950 Medical Record Number:  [id number]   Referring MD: Debby Sor Reason for visit: Seizures   No chief complaint on file.   HPI:   Update 09/04/2023 JM: Patient returns for yearly follow-up visit.  Overall stable from neurological standpoint since prior visit.  Denies any recurrent seizure activity.  Remains on Vimpat  100 mg twice daily without side effects.  Continues to maintain ADLs and IADLs as well as driving.  Is scheduled for PCP follow-up in December.     History provided for reference purposes only Update 08/07/2022 JM: Patient returns for yearly seizure follow-up unaccompanied.  Doing well since prior visit.  Denies any seizure activity.  Remains on Vimpat  100 mg twice daily, tolerating well.  Continues to drive and maintains ADLs and IADLs independently.  Routinely follows with PCP with routine lab work which has been satisfactory.   Update 07/18/2021 JM: Patient returns for 1 year seizure follow-up unaccompanied.  Doing well.  Denies seizure activity.  Compliant on Vimpat  100 mg twice daily, denies side effects.  Continues to maintain ADLs and IADLs.  He routinely follows with PCP.  No new concerns at this time.   Update 07/18/2020 JM: [name] is being seen via video visit for 6 months seizure follow-up unaccompanied.  Reports he has been doing well since prior visit without any additional seizure events or symptoms.  Compliant on Vimpat  100 mg twice daily tolerating without side effects. He does refrain from ETOH use since his seizures.  He does report having some difficulties with arthritis of lumbar region but has been gradually improving and currently using OTC omega-3, turmeric and Voltaren  gel with benefit.  No new concerns at this time.  Update 01/06/2020 JM: [name] returns for 46-month seizure follow-up.  Remains  on Vimpat  100 mg twice daily without side effects and denies any reoccurring seizure events or symptoms.  Maintains ADLs and IADLs independently as well as driving without difficulty.  No concerns at this time.  Update 07/07/2019: Returns for seizure follow-up.  He has been doing well since prior visit 6 months ago without recurrent seizure activity remains on Vimpat  100 mg twice daily tolerating well. He does report cessation of all alcohol.  Blood pressure today 135/74.  He questions safety of arthritic pain medication such as naproxen  in regards to seizures and use of Vimpat  and possible interaction. He does use voltaren  gel and aspercream as needed at night only. No other concerns at this time.   Initial visit 12/02/2017 ;[name] is a pleasant 73 year old Caucasian male who has had multiple episodes of brief loss of consciousness since April 2017.  History is obtained from the patient and review of electronic medical records.  I personally reviewed imaging films.  He actually states that the few months prior to his first episode of passing out he used to have brief episodes of particular smell or taste and he would manage to control it and prevent it from getting worse.  The first episode he passed out while he was brushing teeth he had a feeling that something were to happen before he could control it he fell down got up quickly he was out for only few seconds.  Second episode occurred when he was outside all of a sudden his wife who was standing behind him noticed that he was leaning to  one side and started snorting when his wife began to wake him up he started jerking his arms and then woke up and was quite disoriented for a few minutes.  The third episode he fell in the kitchen floor on the hardwood floor and woke up in a minute.  He is felt he did not have a warning.  The wife also reports other episodes like a staring episode she weakness in the gym about 4 weeks ago when the patient was staring and was  briefly unresponsive and not talking within 30 seconds he started speaking.  Patient has no prior history of known seizures significant head injury with loss of consciousness or intracranial issues except that he had a history of ruptured subarachnoid hemorrhage in 1991 for which he underwent craniotomy and surgical clipping in [place].  He apparently did quite well from that and had no significant physical deficits or cognitive issues.  He has been seen by cardiology who have ordered an echocardiogram which was unremarkable.  Carotid ultrasound done on 11/06/2017 showed no significant extracranial stenosis.  CT scan of the head on 10/28/2017 showed old left craniotomy defect and aneurysm clip on the left side anteriorly.  No acute abnormality.  Patient has not had an EEG.  He has not followed up with a neurologist.   It is unclear if he can have an MRI because his aneurysm clip was in 1991 and I am not sure it is MRI compatible  Update 02/11/2018 ; he returns for follow-up after last visit 2 months ago.  He states he has had no further episodes of aura or passing out or seizure-like episodes.  He is still on Keppra  XR 500 mg daily and states that he felt tired and hence did not feel like he needed to increase the dose further.  He complains of occasional positional dizziness when he bends down.  He is otherwise doing well without any new symptoms.  He had EEG done on 12/31/2017 which I personally reviewed and was normal.  He had CT angiogram of the brain done on 12/11/2017 which also have reviewed shows no new aneurysms.  The previous surgical the clipped aneurysm is noted in the left cavernous distal carotid.  Patient has no new complaints today.  Update 08/19/2018: [name] is a 73 year old male who is being seen today for follow-up regarding seizures.  He reports approximately 6 times daily experiences sensation of nausea, increased elevation, balance difficulties and fatigue which lasts for approximately 1  minute.  Denies loss of consciousness, dizziness, SOB or palpitations.  Unable to associate activity with the symptoms as he can be sitting still while he experiences these.  Prior seizure episodes consist of auras and passing out but he feels as though his most recent symptoms are different from his possible seizure-like episodes.  These have been present for approximately 1 month.  He does endorse increasing Keppra  XR dose as recommended at prior visit from 500 mg daily to 500 mg twice daily approximately 1 month ago.  He denies any recurrent seizure-like episodes.  Blood pressure stable 127/72.  Heart rate 52.  He does not check vitals during or after these episodes.  Update 12/31/2018: [name] is a 73 year old male who is being seen today for seizure follow-up.  Due to possible continued seizure activity, Keppra  dosage increased after prior visit.  Initially doing well with increasing dose but then started experiencing more frequent episodes as described above therefore recommend transitioning to Lamictal .  He unfortunately experienced  frequent episodes of diarrhea therefore transitioned to Vimpat .  He is currently on 100 mg twice daily which he has been tolerating well without any side effects or reoccurring seizure or seizure type activity.  He does report yesterday having a 30 second episode of vertigo but has not experienced vertigo symptoms since and denies any recent vertigo prior to yesterday.  He did check blood pressure at that time and was elevated for him at 133/25.  Blood pressure at today's visit 118/64.  He does report recent weight loss with daily exercise and eating healthy.  No further concerns at this time.       ROS:   14 system review of systems is positive for those listed in HPI and all other systems negative  PMH:  Past Medical History:  Diagnosis Date   Arthritis    Brain aneurysm    Chicken pox    GERD (gastroesophageal reflux disease)    Melanoma (HCC)    Stroke  [facility])     Social History:  Social History   Socioeconomic History   Marital status: Married    Spouse name: Not on file   Number of children: 1   Years of education: 16   Highest education level: Bachelor's degree (e.g., BA, AB, BS)  Occupational History   Occupation: Retired     Comment: Firefighter for [employer]   Occupation: retired  Tobacco Use   Smoking status: Never   Smokeless tobacco: Never  Vaping Use   Vaping status: Never Used  Substance and Sexual Activity   Alcohol use: Not Currently   Drug use: No   Sexual activity: Yes  Other Topics Concern   Not on file  Social History Narrative   Born and raised in [place]. Currently resided in a private residence with his wife. Denies pets.    Fun: Golf and likes to swim / yard-work   Denies religious beliefs effecting healthcare.   One Child    Two grandchildren       Social Drivers of Corporate investment banker Strain: Low Risk  (08/23/2023)   Overall Financial Resource Strain (CARDIA)    Difficulty of Paying Living Expenses: Not hard at all  Food Insecurity: No Food Insecurity (08/23/2023)   Hunger Vital Sign    Worried About Running Out of Food in the Last Year: Never true    Ran Out of Food in the Last Year: Never true  Transportation Needs: No Transportation Needs (08/23/2023)   PRAPARE - Administrator, Civil Service (Medical): No    Lack of Transportation (Non-Medical): No  Physical Activity: Sufficiently Active (08/23/2023)   Exercise Vital Sign    Days of Exercise per Week: 4 days    Minutes of Exercise per Session: 60 min  Stress: No Stress Concern Present (08/23/2023)   [employer] of Occupational Health - Occupational Stress Questionnaire    Feeling of Stress: Not at all  Social Connections: Moderately Integrated (08/23/2023)   Social Connection and Isolation Panel    Frequency of Communication with Friends and Family: Twice a week    Frequency of Social Gatherings with Friends and  Family: Twice a week    Attends Religious Services: Never    Database administrator or Organizations: Yes    Attends Banker Meetings: Never    Marital Status: Married  Catering manager Violence: Not At Risk (01/09/2023)   Humiliation, Afraid, Rape, and Kick questionnaire    Fear of Current or  Ex-Partner: No    Emotionally Abused: No    Physically Abused: No    Sexually Abused: No    Medications:   Current Outpatient Medications on File Prior to Visit  Medication Sig Dispense Refill   Black Pepper-Turmeric 3-500 MG CAPS      diclofenac  Sodium (VOLTAREN ) 1 % GEL Apply 2 g topically 4 (four) times daily as needed. Apply topically 4 (four) times daily as needed. 150 g 6   Multiple Vitamins-Iron (MULTIVITAMINS WITH IRON) TABS tablet Take 1 tablet by mouth daily.     Na Sulfate-K Sulfate-Mg Sulfate concentrate (SUPREP) 17.5-3.13-1.6 GM/177ML SOLN Take 1 kit (354 mLs total) by mouth as directed. (Patient not taking: Reported on 07/10/2023) 354 mL 0   Omega-3 Fatty Acids (FISH OIL) 1200 MG CPDR      rosuvastatin  (CRESTOR ) 10 MG tablet TAKE 1 TABLET BY MOUTH DAILY 30 tablet 0   Tart Cherry 1200 MG CAPS      triamcinolone cream (KENALOG) 0.1 % SMARTSIG:1 Sparingly Topical Twice Daily (Patient not taking: Reported on 07/10/2023)     TURMERIC PO Take by mouth. (Patient not taking: Reported on 05/28/2023)     VIMPAT  100 MG TABS TAKE 1 TABLET BY MOUTH EVERY MORNING AND TAKE 1 TABLET BY MOUTH EVERY NIGHT AT BEDTIME 60 tablet 5   No current facility-administered medications on file prior to visit.    Allergies:  No Active Allergies  Physical Exam There were no vitals filed for this visit.  There is no height or weight on file to calculate BMI.  General: well developed, well nourished, very pleasant elderly Caucasian male, seated, in no evident distress Head: head normocephalic and atraumatic.   Neck: supple with no carotid or supraclavicular bruits Skin:  no  rash/petichiae Vascular:  Normal pulses all extremities   Neurologic Exam Mental Status: Awake and fully alert. Oriented to place and time. Recent and remote memory intact. Attention span, concentration and fund of knowledge appropriate. Mood and affect appropriate.  Cranial Nerves: Pupils equal, briskly reactive to light. Extraocular movements full without nystagmus. Visual fields full to confrontation. Hearing intact. Facial sensation intact. Face, tongue, palate moves normally and symmetrically.  Motor: Normal bulk and tone. Normal strength in all tested extremity muscles Sensory.: intact to touch , pinprick , position and vibratory sensation.  Coordination: Rapid alternating movements normal in all extremities. Finger-to-nose and heel-to-shin performed accurately bilaterally. Gait and Station: Arises from chair without difficulty. Stance is normal. Gait demonstrates normal stride length and balance without use of assistive device. Tandem walk and heel toe without difficulty.  Reflexes: 1+ and symmetric. Toes downgoing.           ASSESSMENT: 73 year male with seizure-like episodes since 2019 consisting of brief loss of consciousness as well as brief episodes of aura possibly complex partial seizures with and without secondary generalization.  Remote history of subarachnoid hemorrhage s/p surgical aneurysm clipping in 1991and seizures may be symptomatic late effect  from this.  Initially placed on Keppra  with complaints of worsening symptoms and unable to tolerate Lamictal  therefore currently on Vimpat  tolerating well without recurrent seizure activity.      PLAN: -Continuation of Vimpat  100 mg twice daily for seizure prevention. Needs to stay on Brand name. Refill up-to-date until 10/2022.  -Discussed avoidance of seizure triggers and to call office with any seizure events or symptoms     Follow-up in 1 year or call earlier if needed   CC:  Nafziger, Cory, NP  I spent 26  minutes of face-to-face and non-face-to-face time with patient.  This included previsit chart review, order entry, electronic health record documentation, and patient education and discussion regarding history of seizures, ongoing use of Vimpat , seizure triggers and importance of avoidance and answered all other questions to patient satisfaction  Harlene Bogaert, AGNP-BC  [facility] [address] [place], [place] [address]  Phone [phone] Fax [phone] Note: This document was prepared with digital dictation and possible smart phrase technology. Any transcriptional errors that result from this process are unintentional.

## 2023-09-04 ENCOUNTER — Encounter: Payer: Self-pay | Admitting: Adult Health

## 2023-09-04 ENCOUNTER — Ambulatory Visit (INDEPENDENT_AMBULATORY_CARE_PROVIDER_SITE_OTHER): Admitting: Adult Health

## 2023-09-04 VITALS — BP 138/74 | HR 54 | Ht 71.0 in | Wt 194.8 lb

## 2023-09-04 DIAGNOSIS — G40209 Localization-related (focal) (partial) symptomatic epilepsy and epileptic syndromes with complex partial seizures, not intractable, without status epilepticus: Secondary | ICD-10-CM | POA: Diagnosis not present

## 2023-09-04 MED ORDER — VIMPAT 100 MG PO TABS
100.0000 mg | ORAL_TABLET | Freq: Two times a day (BID) | ORAL | 5 refills | Status: DC
Start: 2023-09-04 — End: 2023-11-25

## 2023-09-04 NOTE — Patient Instructions (Addendum)
 Your Plan:  Continue Vimpat  100mg  twice daily for seizure prevention   Please call with any recurrent seizure activity      Follow up in 1 year or call earlier if needed     Thank you for coming to see us  at [facility]. I hope we have been able to provide you high quality care today.  You may receive a patient satisfaction survey over the next few weeks. We would appreciate your feedback and comments so that we may continue to improve ourselves and the health of our patients.

## 2023-09-16 ENCOUNTER — Other Ambulatory Visit: Payer: Self-pay | Admitting: Adult Health

## 2023-09-16 DIAGNOSIS — E782 Mixed hyperlipidemia: Secondary | ICD-10-CM

## 2023-09-18 DIAGNOSIS — L570 Actinic keratosis: Secondary | ICD-10-CM | POA: Diagnosis not present

## 2023-09-18 DIAGNOSIS — L820 Inflamed seborrheic keratosis: Secondary | ICD-10-CM | POA: Diagnosis not present

## 2023-09-18 DIAGNOSIS — L853 Xerosis cutis: Secondary | ICD-10-CM | POA: Diagnosis not present

## 2023-09-18 DIAGNOSIS — Z85820 Personal history of malignant melanoma of skin: Secondary | ICD-10-CM | POA: Diagnosis not present

## 2023-09-18 DIAGNOSIS — Z85828 Personal history of other malignant neoplasm of skin: Secondary | ICD-10-CM | POA: Diagnosis not present

## 2023-09-18 DIAGNOSIS — L821 Other seborrheic keratosis: Secondary | ICD-10-CM | POA: Diagnosis not present

## 2023-09-18 DIAGNOSIS — L812 Freckles: Secondary | ICD-10-CM | POA: Diagnosis not present

## 2023-10-16 ENCOUNTER — Other Ambulatory Visit: Payer: Self-pay | Admitting: Adult Health

## 2023-10-16 DIAGNOSIS — E782 Mixed hyperlipidemia: Secondary | ICD-10-CM

## 2023-11-12 ENCOUNTER — Other Ambulatory Visit: Payer: Self-pay | Admitting: Adult Health

## 2023-11-12 DIAGNOSIS — E782 Mixed hyperlipidemia: Secondary | ICD-10-CM

## 2023-11-25 ENCOUNTER — Encounter: Payer: Self-pay | Admitting: Adult Health

## 2023-11-25 DIAGNOSIS — G40209 Localization-related (focal) (partial) symptomatic epilepsy and epileptic syndromes with complex partial seizures, not intractable, without status epilepticus: Secondary | ICD-10-CM

## 2023-11-25 MED ORDER — LACOSAMIDE 100 MG PO TABS: 100.0000 mg | ORAL_TABLET | Freq: Two times a day (BID) | ORAL | 5 refills | Status: AC

## 2023-11-25 NOTE — Telephone Encounter (Signed)
 Last fill 11-05-2023 #[id number], last seen 09-04-2023

## 2023-12-06 NOTE — Telephone Encounter (Signed)
 Pt called wanting to know if the nurse can call him to discuss his Vimpat . Please advise.

## 2023-12-10 ENCOUNTER — Ambulatory Visit: Payer: Self-pay | Admitting: Adult Health

## 2023-12-10 ENCOUNTER — Encounter: Payer: Self-pay | Admitting: Adult Health

## 2023-12-10 ENCOUNTER — Ambulatory Visit (INDEPENDENT_AMBULATORY_CARE_PROVIDER_SITE_OTHER): Admitting: Adult Health

## 2023-12-10 VITALS — BP 138/80 | HR 46 | Temp 97.8°F | Ht 71.0 in | Wt 191.0 lb

## 2023-12-10 DIAGNOSIS — E782 Mixed hyperlipidemia: Secondary | ICD-10-CM | POA: Diagnosis not present

## 2023-12-10 DIAGNOSIS — N401 Enlarged prostate with lower urinary tract symptoms: Secondary | ICD-10-CM

## 2023-12-10 DIAGNOSIS — M150 Primary generalized (osteo)arthritis: Secondary | ICD-10-CM

## 2023-12-10 DIAGNOSIS — G40211 Localization-related (focal) (partial) symptomatic epilepsy and epileptic syndromes with complex partial seizures, intractable, with status epilepticus: Secondary | ICD-10-CM

## 2023-12-10 DIAGNOSIS — Z85820 Personal history of malignant melanoma of skin: Secondary | ICD-10-CM

## 2023-12-10 DIAGNOSIS — R351 Nocturia: Secondary | ICD-10-CM | POA: Diagnosis not present

## 2023-12-10 DIAGNOSIS — Z0000 Encounter for general adult medical examination without abnormal findings: Secondary | ICD-10-CM | POA: Diagnosis not present

## 2023-12-10 LAB — CBC
HCT: 41.8 % (ref 39.0–52.0)
Hemoglobin: 14.4 g/dL (ref 13.0–17.0)
MCHC: 34.4 g/dL (ref 30.0–36.0)
MCV: 93.2 fl (ref 78.0–100.0)
Platelets: 222 K/uL (ref 150.0–400.0)
RBC: 4.48 Mil/uL (ref 4.22–5.81)
RDW: 13.2 % (ref 11.5–15.5)
WBC: 5.1 K/uL (ref 4.0–10.5)

## 2023-12-10 LAB — LIPID PANEL
Cholesterol: 119 mg/dL (ref 0–200)
HDL: 46.5 mg/dL (ref >39.00)
LDL Cholesterol: 61 mg/dL (ref 0–99)
NonHDL: 72.98
Total CHOL/HDL Ratio: 3
Triglycerides: 62 mg/dL (ref 0.0–149.0)
VLDL: 12.4 mg/dL (ref 0.0–40.0)

## 2023-12-10 LAB — COMPREHENSIVE METABOLIC PANEL WITH GFR
ALT: 18 U/L (ref 0–53)
AST: 21 U/L (ref 0–37)
Albumin: 4.3 g/dL (ref 3.5–5.2)
Alkaline Phosphatase: 51 U/L (ref 39–117)
BUN: 22 mg/dL (ref 6–23)
CO2: 30 meq/L (ref 19–32)
Calcium: 9.2 mg/dL (ref 8.4–10.5)
Chloride: 103 meq/L (ref 96–112)
Creatinine, Ser: 1.06 mg/dL (ref 0.40–1.50)
GFR: 69.53 mL/min (ref >60.00)
Glucose, Bld: 90 mg/dL (ref 70–99)
Potassium: 4.2 meq/L (ref 3.5–5.1)
Sodium: 140 meq/L (ref 135–145)
Total Bilirubin: 0.6 mg/dL (ref 0.2–1.2)
Total Protein: 6.7 g/dL (ref 6.0–8.3)

## 2023-12-10 LAB — PSA: PSA: 0.3 ng/mL (ref 0.10–4.00)

## 2023-12-10 LAB — TSH: TSH: 3.87 u[IU]/mL (ref 0.35–5.50)

## 2023-12-10 MED ORDER — ROSUVASTATIN CALCIUM 10 MG PO TABS: 10.0000 mg | ORAL_TABLET | Freq: Every day | ORAL | 3 refills | Status: AC

## 2023-12-10 NOTE — Progress Notes (Signed)
 .cn  Subjective:    Patient ID: James Myers, male    DOB: [birth year], 73 y.o.   MRN: [id number]  HPI Patient presents for yearly preventative medicine examination. He is a pleasant 73 year old male who  has a past medical history of Arthritis, Brain aneurysm, Chicken pox, GERD (gastroesophageal reflux disease), Melanoma (HCC), and Stroke (HCC).  Epilepsy -partial symptomatic epilepsy with complex partial seizures.  He is followed by neurology.  He is compliant with Vimpat  100 mg twice daily and tolerates this medication without side effects.  Has not experienced any recent seizure-like activity.  Hyperlipidemia - takes Crestor  10 mg daily. He denies myalgia or fatigue  Lab Results  Component Value Date   CHOL 132 03/07/2022   HDL 52.80 03/07/2022   LDLCALC 70 03/07/2022   TRIG 45.0 03/07/2022   CHOLHDL 2 03/07/2022    H/o skin cancer -has routine visits with dermatology and was diagnosed with melanoma of his scalp and has completed treatment   BPH - has nocturia and gets up multiple times a night. Does not want to start medication at this time   Osteoarthritis - mostly in his shoulders, he uses Voltaren  gel with good results    All immunizations and health maintenance protocols were reviewed with the patient and needed orders were placed.  Appropriate screening laboratory values were ordered for the patient including screening of hyperlipidemia, renal function and hepatic function. If indicated by BPH, a PSA was ordered.  Medication reconciliation,  past medical history, social history, problem list and allergies were reviewed in detail with the patient  Goals were established with regard to weight loss, exercise, and  diet in compliance with medications. He is going to the [facility] and trying to eat healthier.   Wt Readings from Last 3 Encounters:  12/10/23 191 lb (86.6 kg)  09/04/23 194 lb 12.8 oz (88.4 kg)  07/10/23 196 lb (88.9 kg)    Review of Systems  Constitutional:  Negative.   HENT:  Positive for tinnitus (chronic).   Eyes: Negative.   Respiratory: Negative.    Cardiovascular: Negative.   Gastrointestinal: Negative.   Endocrine: Negative.   Genitourinary: Negative.        Nocturia    Musculoskeletal:  Positive for arthralgias.  Skin:  Positive for wound.  Allergic/Immunologic: Negative.   Neurological: Negative.   Hematological: Negative.   Psychiatric/Behavioral: Negative.    All other systems reviewed and are negative.  Past Medical History:  Diagnosis Date   Arthritis    Brain aneurysm    Chicken pox    GERD (gastroesophageal reflux disease)    Melanoma (HCC)    Stroke [facility])     Social History   Socioeconomic History   Marital status: Married    Spouse name: Not on file   Number of children: 1   Years of education: 16   Highest education level: Bachelor's degree (e.g., BA, AB, BS)  Occupational History   Occupation: Retired     Comment: Firefighter for [employer]   Occupation: retired  Tobacco Use   Smoking status: Never   Smokeless tobacco: Never  Vaping Use   Vaping status: Never Used  Substance and Sexual Activity   Alcohol use: Not Currently   Drug use: No   Sexual activity: Yes  Other Topics Concern   Not on file  Social History Narrative   Born and raised in [place]. Currently resided in a private residence with his wife. Denies pets.    Fun: Golf  and likes to swim / yard-work   Denies religious beliefs effecting healthcare.   One Child    Two grandchildren       Social Drivers of Corporate Investment Banker Strain: Low Risk  (12/07/2023)   Overall Financial Resource Strain (CARDIA)    Difficulty of Paying Living Expenses: Not hard at all  Food Insecurity: No Food Insecurity (12/07/2023)   Hunger Vital Sign    Worried About Running Out of Food in the Last Year: Never true    Ran Out of Food in the Last Year: Never true  Transportation Needs: No Transportation Needs (12/07/2023)   PRAPARE - Therapist, Art (Medical): No    Lack of Transportation (Non-Medical): No  Physical Activity: Sufficiently Active (12/07/2023)   Exercise Vital Sign    Days of Exercise per Week: 4 days    Minutes of Exercise per Session: 60 min  Stress: No Stress Concern Present (12/07/2023)   [employer] of Occupational Health - Occupational Stress Questionnaire    Feeling of Stress: Only a little  Social Connections: Moderately Integrated (12/07/2023)   Social Connection and Isolation Panel    Frequency of Communication with Friends and Family: Twice a week    Frequency of Social Gatherings with Friends and Family: Twice a week    Attends Religious Services: Never    Database Administrator or Organizations: Yes    Attends Banker Meetings: 1 to 4 times per year    Marital Status: Married  Catering Manager Violence: Not At Risk (01/09/2023)   Humiliation, Afraid, Rape, and Kick questionnaire    Fear of Current or Ex-Partner: No    Emotionally Abused: No    Physically Abused: No    Sexually Abused: No    Past Surgical History:  Procedure Laterality Date   BRAIN SURGERY  1991   skin removal with skin graft  01/11/2023   TONSILLECTOMY AND ADENOIDECTOMY      Family History  Problem Relation Age of Onset   Aneurysm Mother        brain    Arthritis Mother    Parkinson's disease Mother    Stroke Father    Arthritis Father    Syncope episode Father    Tremor Brother    Arthritis Maternal Grandmother    Diabetes Maternal Grandfather    Arthritis Paternal Grandmother    Colon cancer Neg Hx    Stomach cancer Neg Hx    Esophageal cancer Neg Hx     No Active Allergies  Current Outpatient Medications on File Prior to Visit  Medication Sig Dispense Refill   acetaminophen (TYLENOL) 500 MG tablet      Black Pepper-Turmeric 3-500 MG CAPS      diclofenac  Sodium (VOLTAREN ) 1 % GEL Apply 2 g topically 4 (four) times daily as needed. Apply topically 4 (four) times daily as  needed. 150 g 6   Multiple Vitamins-Iron (MULTIVITAMINS WITH IRON) TABS tablet Take 1 tablet by mouth daily.     Omega-3 Fatty Acids (FISH OIL) 1200 MG CPDR      Tart Cherry 1200 MG CAPS      Turmeric, Curcuma Longa, POWD      [START ON 12/30/2023] Lacosamide  (VIMPAT ) 100 MG TABS Take 1 tablet (100 mg total) by mouth 2 (two) times daily. (Patient not taking: Reported on 12/10/2023) 60 tablet 5   No current facility-administered medications on file prior to visit.    BP 138/80  Pulse (!) 46   Temp 97.8 F (36.6 C) (Oral)   Ht 5' 11 (1.803 m)   Wt 191 lb (86.6 kg)   SpO2 98%   BMI 26.64 kg/m       Objective:   Physical Exam Vitals and nursing note reviewed.  Constitutional:      General: He is not in acute distress.    Appearance: Normal appearance. He is not ill-appearing.  HENT:     Head: Normocephalic and atraumatic.     Right Ear: Tympanic membrane, ear canal and external ear normal. There is no impacted cerumen.     Left Ear: Tympanic membrane, ear canal and external ear normal. There is no impacted cerumen.     Nose: Nose normal. No congestion or rhinorrhea.     Mouth/Throat:     Mouth: Mucous membranes are moist.     Pharynx: Oropharynx is clear.  Eyes:     Extraocular Movements: Extraocular movements intact.     Conjunctiva/sclera: Conjunctivae normal.     Pupils: Pupils are equal, round, and reactive to light.  Neck:     Vascular: No carotid bruit.  Cardiovascular:     Rate and Rhythm: Normal rate and regular rhythm.     Pulses: Normal pulses.     Heart sounds: No murmur heard.    No friction rub. No gallop.  Pulmonary:     Effort: Pulmonary effort is normal.     Breath sounds: Normal breath sounds.  Abdominal:     General: Abdomen is flat. Bowel sounds are normal. There is no distension.     Palpations: Abdomen is soft. There is no mass.     Tenderness: There is no abdominal tenderness. There is no guarding or rebound.     Hernia: No hernia is present.   Musculoskeletal:        General: Normal range of motion.     Cervical back: Normal range of motion and neck supple.  Lymphadenopathy:     Cervical: No cervical adenopathy.  Skin:    General: Skin is warm and dry.     Capillary Refill: Capillary refill takes less than 2 seconds.     Comments: Well healed wound on crown of head   Neurological:     General: No focal deficit present.     Mental Status: He is alert and oriented to person, place, and time.  Psychiatric:        Mood and Affect: Mood normal.        Behavior: Behavior normal.        Thought Content: Thought content normal.        Judgment: Judgment normal.       Assessment & Plan:  1. Routine general medical examination at a health care facility (Primary) Today patient counseled on age appropriate routine health concerns for screening and prevention, each reviewed and up to date or declined. Immunizations reviewed and up to date or declined. Labs ordered and reviewed. Risk factors for depression reviewed and negative. Hearing function and visual acuity are intact. ADLs screened and addressed as needed. Functional ability and level of safety reviewed and appropriate. Education, counseling and referrals performed based on assessed risks today. Patient provided with a copy of personalized plan for preventive services. - Continue to eat healthy and exercise - Follow up in one year or sooner if needed  2. Partial symptomatic epilepsy with complex partial seizures, intractable, with status epilepticus (HCC) - Per Neurology  - Continue with Vimpat  -  Lipid panel; Future - TSH; Future - CBC; Future - Comprehensive metabolic panel with GFR; Future  3. Mixed hyperlipidemia - Continue Crestor  10 mg daily  - Lipid panel; Future - TSH; Future - CBC; Future - Comprehensive metabolic panel with GFR; Future - rosuvastatin  (CRESTOR ) 10 MG tablet; Take 1 tablet (10 mg total) by mouth daily.  Dispense: 90 tablet; Refill: 3  4. Benign  prostatic hyperplasia with nocturia  - PSA; Future  5. Primary osteoarthritis involving multiple joints -Continue conservative measures  6. History of melanoma - Follow up with Dermatology as directed   Darleene Shape, NP

## 2023-12-10 NOTE — Patient Instructions (Signed)
 It was great seeing you today   We will follow up with you regarding your lab work   Please let me know if you need anything

## 2023-12-18 DIAGNOSIS — D485 Neoplasm of uncertain behavior of skin: Secondary | ICD-10-CM | POA: Diagnosis not present

## 2023-12-18 DIAGNOSIS — L853 Xerosis cutis: Secondary | ICD-10-CM | POA: Diagnosis not present

## 2023-12-18 DIAGNOSIS — D0461 Carcinoma in situ of skin of right upper limb, including shoulder: Secondary | ICD-10-CM | POA: Diagnosis not present

## 2023-12-18 DIAGNOSIS — Z85828 Personal history of other malignant neoplasm of skin: Secondary | ICD-10-CM | POA: Diagnosis not present

## 2023-12-18 DIAGNOSIS — M7130 Other bursal cyst, unspecified site: Secondary | ICD-10-CM | POA: Diagnosis not present

## 2023-12-18 DIAGNOSIS — C44519 Basal cell carcinoma of skin of other part of trunk: Secondary | ICD-10-CM | POA: Diagnosis not present

## 2023-12-18 DIAGNOSIS — L570 Actinic keratosis: Secondary | ICD-10-CM | POA: Diagnosis not present

## 2023-12-18 DIAGNOSIS — L821 Other seborrheic keratosis: Secondary | ICD-10-CM | POA: Diagnosis not present

## 2023-12-18 DIAGNOSIS — Z85820 Personal history of malignant melanoma of skin: Secondary | ICD-10-CM | POA: Diagnosis not present

## 2024-01-08 DIAGNOSIS — H2513 Age-related nuclear cataract, bilateral: Secondary | ICD-10-CM | POA: Diagnosis not present

## 2024-01-08 DIAGNOSIS — D3131 Benign neoplasm of right choroid: Secondary | ICD-10-CM | POA: Diagnosis not present

## 2024-01-14 ENCOUNTER — Encounter: Payer: Self-pay | Admitting: Family Medicine

## 2024-01-14 ENCOUNTER — Ambulatory Visit: Admitting: Family Medicine

## 2024-01-14 DIAGNOSIS — Z0000 Encounter for general adult medical examination without abnormal findings: Secondary | ICD-10-CM | POA: Diagnosis not present

## 2024-01-14 NOTE — Patient Instructions (Addendum)
 I really enjoyed getting to talk with you today! I am available on Tuesdays and Thursdays for virtual visits if you have any questions or concerns, or if I can be of any further assistance.   CHECKLIST FROM ANNUAL WELLNESS VISIT:  -Follow up (please call to schedule if not scheduled after visit):   -yearly for annual wellness visit with primary care office  Here is a list of your preventive care/health maintenance measures and the plan for each if any are due:   Health Maintenance  Topic Date Due   COVID-19 Vaccine (11 - Pfizer risk 2025-26 season) 04/16/2024   Medicare Annual Wellness (AWV)  01/13/2025   DTaP/Tdap/Td (2 - Td or Tdap) 12/09/2032   Colonoscopy  07/09/2033   Pneumococcal Vaccine: 50+ Years  Completed   Influenza Vaccine  Completed   Hepatitis C Screening  Completed   Zoster Vaccines- Shingrix  Completed   Meningococcal B Vaccine  Aged Out    -See a dentist at least yearly  -Get your eyes checked and then per your eye specialist's recommendations  -Other issues addressed today:   -I have included below further information regarding a healthy whole foods based diet, physical activity guidelines for adults, stress management and opportunities for social connections. I hope you find this information useful.   -----------------------------------------------------------------------------------------------------------------------------------------------------------------------------------------------------------------------------------------------------------    NUTRITION: -eat real food: lots of colorful vegetables (half the plate) and fruits -5-7 servings of vegetables and fruits per day (fresh or steamed is best), exp. 2 servings of vegetables with lunch and dinner and 2 servings of fruit per day. Berries and greens such as kale and collards are great choices.  -consume on a regular basis:  fresh fruits, fresh veggies, fish, nuts, seeds, healthy oils (such as olive  oil, avocado oil), whole grains (make sure for bread/pasta/crackers/etc., that the first ingredient on label contains the word whole), legumes. -can eat small amounts of dairy and lean meat (no larger than the palm of your hand), but avoid processed meats such as ham, bacon, lunch meat, etc. -drink water -try to avoid fast food and pre-packaged foods, processed meat, ultra processed foods/beverages (donuts, candy, etc.) -most experts advise limiting sodium to < 2300mg  per day, should limit further is any chronic conditions such as high blood pressure, heart disease, diabetes, etc. The American Heart Association advised that < 1500mg  is is ideal -try to avoid foods/beverages that contain any ingredients with names you do not recognize  -try to avoid foods/beverages  with added sugar or sweeteners/sweets  -try to avoid sweet drinks (including diet drinks): soda, juice, Gatorade, sweet tea, power drinks, diet drinks -try to avoid white rice, white bread, pasta (unless whole grain)  EXERCISE GUIDELINES FOR ADULTS: -if you wish to increase your physical activity, do so gradually and with the approval of your doctor -STOP and seek medical care immediately if you have any chest pain, chest discomfort or trouble breathing when starting or increasing exercise  -move and stretch your body, legs, feet and arms when sitting for long periods -Physical activity guidelines for optimal health in adults: -get at least 150 minutes per week of moderate exercise (can talk, but not sing); this is about 20-30 minutes of sustained activity 5-7 days per week or two 10-15 minute episodes of sustained activity 5-7 days per week -do some muscle building/resistance training/strength training at least 2 days per week  -balance exercises 3+ days per week:   Stand somewhere where you have something sturdy to hold onto if you lose balance  1) lift up on toes, then back down, start with 5x per day and work up to 20x   2)  stand and lift one leg straight out to the side so that foot is a few inches of the floor, start with 5x each side and work up to 20x each side   3) stand on one foot, start with 5 seconds each side and work up to 20 seconds on each side  If you need ideas or help with getting more active:  -Silver sneakers https://tools.silversneakers.com  -Walk with a Doc: Http://www.duncan-williams.com/  -try to include resistance (weight lifting/strength building) and balance exercises twice per week: or the following link for ideas: http://castillo-powell.com/  buyducts.dk  STRESS MANAGEMENT: -can try meditating, or just sitting quietly with deep breathing while intentionally relaxing all parts of your body for 5 minutes daily -if you need further help with stress, anxiety or depression please follow up with your primary doctor or contact the wonderful folks at [facility]: [phone]  SOCIAL CONNECTIONS: -options in [place] if you wish to engage in more social and exercise related activities:  -Silver sneakers https://tools.silversneakers.com  -Walk with a Doc: Http://www.duncan-williams.com/  -Check out the [facility] Active Adults 50+ section on the [place] of [employer] companies (hiking clubs, book clubs, cards and games, chess, exercise classes, aquatic classes and much more) - see the website for details: https://www.[place].gov/departments/parks-recreation/active-adults50  -YouTube has lots of exercise videos for different ages and abilities as well  -[facility] (a variety of indoor and outdoor inperson activities for adults). (619)052-4931. [address]  -Virtual Online Classes (a variety of topics): see seniorplanet.org or call [phone]  -consider volunteering at a school, hospice center, church, senior center or elsewhere

## 2024-01-14 NOTE — Progress Notes (Signed)
 ----------------------------------------------------------------------------------------------------------------------------------------------------------------------------------------------------------------------  Because this visit was a virtual/telehealth visit, some criteria may be missing or patient reported. Any vitals not documented were not able to be obtained and vitals that have been documented are patient reported.    MEDICARE ANNUAL PREVENTIVE CARE VISIT WITH PROVIDER (Welcome to Medicare, initial annual wellness or annual wellness exam)  Virtual Visit via Phone Note  I connected with James Myers on 01/14/2024  by phone and verified that I am speaking with the correct person using two identifiers.  Location patient: home Location provider:work or home office Persons participating in the virtual visit: patient, provider  Concerns and/or follow up today: detailed intake and health/risks assessment completed on flow sheets and below- please see for details.   How often do you have a drink containing alcohol?n How many drinks containing alcohol do you have on a typical day when you are drinking?na How often do you have six or more drinks on one occasion?na Have you ever smoked?n Quit date if applicable? na  How many packs a day do/did you smoke? na Do you use smokeless tobacco?n Do you use an illicit drugs?n Do you feel safe at home?y Last dentist visit? Goes on a regular basis Last eye Exam and location? Dr. Glendia Gaudy   See HM section in Epic for other details of completed HM.    ROS: negative for report of fevers, unintentional weight loss, vision changes, vision loss, hearing loss or change, chest pain, sob, hemoptysis, melena, hematochezia, hematuria,bleeding or bruising  Patient-completed extensive health risk assessment - reviewed and discussed with the patient: See Health Risk Assessment completed with patient prior to the visit either above or in recent phone  note. This was reviewed in detailed with the patient today and appropriate recommendations, orders and referrals were placed as needed per Summary below and patient instructions.   Review of Medical History: -PMH, PSH, Family History and current specialty and care providers reviewed and updated and listed below   Patient Care Team: Merna Huxley, NP as PCP - General (Family Medicine) Burnard Debby LABOR, MD (Inactive) as PCP - Cardiology (Cardiology) Gaudy, Charlie Glendia, MD as Consulting Physician (Ophthalmology)   Past Medical History:  Diagnosis Date   Arthritis    Brain aneurysm    Chicken pox    GERD (gastroesophageal reflux disease)    Melanoma (HCC)    Seizures (HCC)    Stroke [facility])     Past Surgical History:  Procedure Laterality Date   BRAIN SURGERY  1991   skin removal with skin graft  01/11/2023   TONSILLECTOMY AND ADENOIDECTOMY      Social History   Socioeconomic History   Marital status: Married    Spouse name: Not on file   Number of children: 1   Years of education: 16   Highest education level: Bachelor's degree (e.g., BA, AB, BS)  Occupational History   Occupation: Retired     Comment: Firefighter for [employer]   Occupation: retired  Tobacco Use   Smoking status: Never   Smokeless tobacco: Never  Vaping Use   Vaping status: Never Used  Substance and Sexual Activity   Alcohol use: Not Currently   Drug use: Never   Sexual activity: Yes  Other Topics Concern   Not on file  Social History Narrative   Born and raised in [place]. Currently resided in a private residence with his wife. Denies pets.    Fun: Golf and likes to swim / yard-work   Denies religious beliefs effecting  healthcare.   One Child    Two grandchildren       Social Drivers of Health   Tobacco Use: Low Risk (01/14/2024)   Patient History    Smoking Tobacco Use: Never    Smokeless Tobacco Use: Never    Passive Exposure: Not on file  Financial Resource Strain: Low Risk (12/07/2023)    Overall Financial Resource Strain (CARDIA)    Difficulty of Paying Living Expenses: Not hard at all  Food Insecurity: No Food Insecurity (01/14/2024)   Epic    Worried About Programme Researcher, Broadcasting/film/video in the Last Year: Never true    Ran Out of Food in the Last Year: Never true  Transportation Needs: No Transportation Needs (01/14/2024)   Epic    Lack of Transportation (Medical): No    Lack of Transportation (Non-Medical): No  Physical Activity: Sufficiently Active (01/14/2024)   Exercise Vital Sign    Days of Exercise per Week: 4 days    Minutes of Exercise per Session: 60 min  Stress: No Stress Concern Present (01/14/2024)   [employer] of Occupational Health - Occupational Stress Questionnaire    Feeling of Stress: Only a little  Social Connections: Moderately Integrated (01/14/2024)   Social Connection and Isolation Panel    Frequency of Communication with Friends and Family: Twice a week    Frequency of Social Gatherings with Friends and Family: Twice a week    Attends Religious Services: Never    Database Administrator or Organizations: Yes    Attends Banker Meetings: 1 to 4 times per year    Marital Status: Married  Catering Manager Violence: Not At Risk (01/14/2024)   Epic    Fear of Current or Ex-Partner: No    Emotionally Abused: No    Physically Abused: No    Sexually Abused: No  Depression (PHQ2-9): Low Risk (01/14/2024)   Depression (PHQ2-9)    PHQ-2 Score: 1  Alcohol Screen: Low Risk (01/08/2023)   Alcohol Screen    Last Alcohol Screening Score (AUDIT): 0  Housing: Low Risk (01/14/2024)   Epic    Unable to Pay for Housing in the Last Year: No    Number of Times Moved in the Last Year: 0    Homeless in the Last Year: No  Utilities: Not At Risk (01/14/2024)   Epic    Threatened with loss of utilities: No  Health Literacy: Adequate Health Literacy (01/14/2024)   B1300 Health Literacy    Frequency of need for help with medical instructions:  Never    Family History  Problem Relation Age of Onset   Aneurysm Mother        brain    Arthritis Mother    Parkinson's disease Mother    Stroke Father    Arthritis Father    Syncope episode Father    Tremor Brother    Arthritis Maternal Grandmother    Diabetes Maternal Grandfather    Arthritis Paternal Grandmother    Colon cancer Neg Hx    Stomach cancer Neg Hx    Esophageal cancer Neg Hx     Medications Ordered Prior to Encounter[1]  Allergies[2]     Physical Exam Vitals requested from patient and listed below if patient had equipment and was able to obtain at home for this virtual visit: There were no vitals filed for this visit. Estimated body mass index is 26.64 kg/m as calculated from the following:   Height as of 12/10/23: 5' 11 (1.803 m).  Weight as of 12/10/23: 191 lb (86.6 kg).  EKG (optional): deferred due to virtual visit  GENERAL: alert, oriented, no acute distress detected; full vision exam deferred due to pandemic and/or virtual encounter   PSYCH/NEURO: pleasant and cooperative, no obvious depression or anxiety, speech and thought processing grossly intact, Cognitive function grossly intact  Flowsheet Row Clinical Support from 01/14/2024 in [facility] [place]  PHQ-9 Total Score 1        01/14/2024    3:15 PM 12/10/2023    7:23 AM 01/09/2023    9:33 AM 01/04/2022    1:50 PM 01/03/2021    1:54 PM  Depression screen PHQ 2/9  Decreased Interest 0 0 0 0 0  Down, Depressed, Hopeless 0 0 0 0 0  PHQ - 2 Score 0 0 0 0 0  Altered sleeping 1      Tired, decreased energy 0      Change in appetite 0      Feeling bad or failure about yourself  0      Trouble concentrating 0      Moving slowly or fidgety/restless 0      Suicidal thoughts 0      PHQ-9 Score 1           01/11/2024    9:35 AM 01/13/2024   12:28 PM 01/14/2024    3:15 PM 01/14/2024    3:20 PM 01/14/2024    3:38 PM  Fall Risk  Falls in the past year? 0  0   0 0   Was there an injury with Fall?   0    Fall Risk Category Calculator   0    Patient at Risk for Falls Due to   History of fall(s)  No Fall Risks  Fall risk Follow up   Falls evaluation completed  Falls evaluation completed     Manually entered by patient     SUMMARY AND PLAN:  Medicare annual wellness visit, subsequent  Reviewed health maintenance measures and he is currently up to date. Reviewed recent labs and discussed goals. His labs looked good.   Health Maintenance  Topic Date Due   COVID-19 Vaccine (11 - Pfizer risk 2025-26 season) 04/16/2024   Medicare Annual Wellness (AWV)  01/13/2025   DTaP/Tdap/Td (2 - Td or Tdap) 12/09/2032   Colonoscopy  07/09/2033   Pneumococcal Vaccine: 50+ Years  Completed   Influenza Vaccine  Completed   Hepatitis C Screening  Completed   Zoster Vaccines- Shingrix  Completed   Meningococcal B Vaccine  Aged Out     Education and counseling on the following was provided based on the above review of health and a plan/checklist for the patient, along with additional information discussed, was provided for the patient in the patient instructions :  -Advised and counseled on a healthy lifestyle - including the importance of a healthy diet, regular physical activity, social connections and stress management. -Reviewed patient's current diet. Advised and counseled on a whole foods based healthy diet. Discussed some options to improve nutrition when eating out. A summary of a healthy diet was provided in the Patient Instructions.  -reviewed patient's current physical activity level and discussed exercise guidelines for adults. Congratulated on healthy habits. Further resources provided in patient instructions.   Follow up: see patient instructions   Patient Instructions  I really enjoyed getting to talk with you today! I am available on Tuesdays and Thursdays for virtual visits if you have any questions or  concerns, or if I can be of any further  assistance.   CHECKLIST FROM ANNUAL WELLNESS VISIT:  -Follow up (please call to schedule if not scheduled after visit):   -yearly for annual wellness visit with primary care office  Here is a list of your preventive care/health maintenance measures and the plan for each if any are due:   Health Maintenance  Topic Date Due   COVID-19 Vaccine (11 - Pfizer risk 2025-26 season) 04/16/2024   Medicare Annual Wellness (AWV)  01/13/2025   DTaP/Tdap/Td (2 - Td or Tdap) 12/09/2032   Colonoscopy  07/09/2033   Pneumococcal Vaccine: 50+ Years  Completed   Influenza Vaccine  Completed   Hepatitis C Screening  Completed   Zoster Vaccines- Shingrix  Completed   Meningococcal B Vaccine  Aged Out    -See a dentist at least yearly  -Get your eyes checked and then per your eye specialist's recommendations  -Other issues addressed today:   -I have included below further information regarding a healthy whole foods based diet, physical activity guidelines for adults, stress management and opportunities for social connections. I hope you find this information useful.   -----------------------------------------------------------------------------------------------------------------------------------------------------------------------------------------------------------------------------------------------------------    NUTRITION: -eat real food: lots of colorful vegetables (half the plate) and fruits -5-7 servings of vegetables and fruits per day (fresh or steamed is best), exp. 2 servings of vegetables with lunch and dinner and 2 servings of fruit per day. Berries and greens such as kale and collards are great choices.  -consume on a regular basis:  fresh fruits, fresh veggies, fish, nuts, seeds, healthy oils (such as olive oil, avocado oil), whole grains (make sure for bread/pasta/crackers/etc., that the first ingredient on label contains the word whole), legumes. -can eat small amounts of dairy  and lean meat (no larger than the palm of your hand), but avoid processed meats such as ham, bacon, lunch meat, etc. -drink water -try to avoid fast food and pre-packaged foods, processed meat, ultra processed foods/beverages (donuts, candy, etc.) -most experts advise limiting sodium to < 2300mg  per day, should limit further is any chronic conditions such as high blood pressure, heart disease, diabetes, etc. The American Heart Association advised that < 1500mg  is is ideal -try to avoid foods/beverages that contain any ingredients with names you do not recognize  -try to avoid foods/beverages  with added sugar or sweeteners/sweets  -try to avoid sweet drinks (including diet drinks): soda, juice, Gatorade, sweet tea, power drinks, diet drinks -try to avoid white rice, white bread, pasta (unless whole grain)  EXERCISE GUIDELINES FOR ADULTS: -if you wish to increase your physical activity, do so gradually and with the approval of your doctor -STOP and seek medical care immediately if you have any chest pain, chest discomfort or trouble breathing when starting or increasing exercise  -move and stretch your body, legs, feet and arms when sitting for long periods -Physical activity guidelines for optimal health in adults: -get at least 150 minutes per week of moderate exercise (can talk, but not sing); this is about 20-30 minutes of sustained activity 5-7 days per week or two 10-15 minute episodes of sustained activity 5-7 days per week -do some muscle building/resistance training/strength training at least 2 days per week  -balance exercises 3+ days per week:   Stand somewhere where you have something sturdy to hold onto if you lose balance    1) lift up on toes, then back down, start with 5x per day and work up to 20x   2)  stand and lift one leg straight out to the side so that foot is a few inches of the floor, start with 5x each side and work up to 20x each side   3) stand on one foot, start with 5  seconds each side and work up to 20 seconds on each side  If you need ideas or help with getting more active:  -Silver sneakers https://tools.silversneakers.com  -Walk with a Doc: Http://www.duncan-williams.com/  -try to include resistance (weight lifting/strength building) and balance exercises twice per week: or the following link for ideas: http://castillo-powell.com/  buyducts.dk  STRESS MANAGEMENT: -can try meditating, or just sitting quietly with deep breathing while intentionally relaxing all parts of your body for 5 minutes daily -if you need further help with stress, anxiety or depression please follow up with your primary doctor or contact the wonderful folks at [facility]: [phone]  SOCIAL CONNECTIONS: -options in [place] if you wish to engage in more social and exercise related activities:  -Silver sneakers https://tools.silversneakers.com  -Walk with a Doc: Http://www.duncan-williams.com/  -Check out the [employer] Active Adults 50+ section on the [place] [employer] (hiking clubs, book clubs, cards and games, chess, exercise classes, aquatic classes and much more) - see the website for details: https://www.[place].gov/departments/parks-recreation/active-adults50  -YouTube has lots of exercise videos for different ages and abilities as well  -[facility] (a variety of indoor and outdoor inperson activities for adults). (367)428-2736. [address]  -Virtual Online Classes (a variety of topics): see seniorplanet.org or call [phone]  -consider volunteering at a school, hospice center, church, senior center or elsewhere            Chiquita JONELLE Cramp, DO     [1]  Current Outpatient Medications on File Prior to Visit  Medication Sig Dispense Refill   acetaminophen (TYLENOL) 500 MG tablet      Black Pepper-Turmeric 3-500 MG  CAPS      diclofenac  Sodium (VOLTAREN ) 1 % GEL Apply 2 g topically 4 (four) times daily as needed. Apply topically 4 (four) times daily as needed. 150 g 6   Multiple Vitamins-Iron (MULTIVITAMINS WITH IRON) TABS tablet Take 1 tablet by mouth daily.     Omega-3 Fatty Acids (FISH OIL) 1200 MG CPDR      rosuvastatin  (CRESTOR ) 10 MG tablet Take 1 tablet (10 mg total) by mouth daily. 90 tablet 3   Tart Cherry 1200 MG CAPS      Turmeric, Curcuma Longa, POWD      Lacosamide  (VIMPAT ) 100 MG TABS Take 1 tablet (100 mg total) by mouth 2 (two) times daily. (Patient not taking: Reported on 01/14/2024) 60 tablet 5   No current facility-administered medications on file prior to visit.  [2] No Known Allergies

## 2024-09-09 ENCOUNTER — Ambulatory Visit: Admitting: Adult Health
